# Patient Record
Sex: Female | Born: 1955
Health system: Southern US, Community
[De-identification: ages and names within clinical notes are randomized; demographics above are authoritative.]

## PROBLEM LIST (undated history)

## (undated) DIAGNOSIS — Z9889 Other specified postprocedural states: Secondary | ICD-10-CM

## (undated) DIAGNOSIS — I10 Essential (primary) hypertension: Secondary | ICD-10-CM

## (undated) DIAGNOSIS — E039 Hypothyroidism, unspecified: Secondary | ICD-10-CM

## (undated) DIAGNOSIS — C801 Malignant (primary) neoplasm, unspecified: Secondary | ICD-10-CM

## (undated) DIAGNOSIS — Z90710 Acquired absence of both cervix and uterus: Secondary | ICD-10-CM

## (undated) DIAGNOSIS — R7303 Prediabetes: Secondary | ICD-10-CM

## (undated) DIAGNOSIS — R112 Nausea with vomiting, unspecified: Secondary | ICD-10-CM

## (undated) DIAGNOSIS — J302 Other seasonal allergic rhinitis: Secondary | ICD-10-CM

## (undated) HISTORY — PX: COLONOSCOPY: SHX174

## (undated) HISTORY — PX: TONSILLECTOMY: SUR1361

## (undated) HISTORY — PX: TOTAL THYROIDECTOMY: SHX2547

## (undated) HISTORY — PX: WISDOM TOOTH EXTRACTION: SHX21

---

## 1999-01-16 ENCOUNTER — Other Ambulatory Visit: Admission: RE | Admit: 1999-01-16 | Discharge: 1999-01-16 | Payer: Self-pay | Admitting: Obstetrics and Gynecology

## 1999-02-22 ENCOUNTER — Ambulatory Visit (HOSPITAL_COMMUNITY): Admission: RE | Admit: 1999-02-22 | Discharge: 1999-02-22 | Payer: Self-pay | Admitting: Infectious Diseases

## 2014-08-26 DIAGNOSIS — E049 Nontoxic goiter, unspecified: Secondary | ICD-10-CM | POA: Insufficient documentation

## 2014-12-23 ENCOUNTER — Other Ambulatory Visit: Payer: Self-pay | Admitting: Internal Medicine

## 2014-12-23 DIAGNOSIS — C73 Malignant neoplasm of thyroid gland: Secondary | ICD-10-CM

## 2015-02-21 ENCOUNTER — Ambulatory Visit
Admission: RE | Admit: 2015-02-21 | Discharge: 2015-02-21 | Disposition: A | Discharge status: Home / Self Care | Payer: Managed Care, Other (non HMO) | Source: Ambulatory Visit | Attending: Internal Medicine | Admitting: Internal Medicine

## 2015-02-21 DIAGNOSIS — C73 Malignant neoplasm of thyroid gland: Secondary | ICD-10-CM

## 2015-05-17 ENCOUNTER — Ambulatory Visit: Payer: Managed Care, Other (non HMO) | Admitting: Podiatry

## 2015-05-18 ENCOUNTER — Encounter: Payer: Self-pay | Admitting: Podiatry

## 2015-05-18 ENCOUNTER — Ambulatory Visit (INDEPENDENT_AMBULATORY_CARE_PROVIDER_SITE_OTHER): Payer: Managed Care, Other (non HMO) | Admitting: Podiatry

## 2015-05-18 VITALS — BP 138/78 | HR 68 | Resp 15 | Ht 64.5 in | Wt 220.0 lb

## 2015-05-18 DIAGNOSIS — M2042 Other hammer toe(s) (acquired), left foot: Secondary | ICD-10-CM

## 2015-05-18 DIAGNOSIS — M722 Plantar fascial fibromatosis: Secondary | ICD-10-CM

## 2015-05-18 DIAGNOSIS — M779 Enthesopathy, unspecified: Secondary | ICD-10-CM

## 2015-05-18 NOTE — Progress Notes (Signed)
   Subjective:    Patient ID: Katelyn [name], female    DOB: Mar 04, 1956, 59 y.o.   MRN: [id number]  HPI Patient presents with bilateral shin pain. Patient would like to know if that pain is related to their plantar fasciitis they have. Patient has had for past several years.  Patient also has a bilateral nail problem on their pinky toes. Nails are thicker. Patient would like checked to make sure they do not have any toenail fungus. This has been going on for the past 2 years.  Review of Systems  Musculoskeletal: Positive for myalgias.  Skin: Positive for color change.  All other systems reviewed and are negative.      Objective:   Physical Exam        Assessment & Plan:

## 2015-05-18 NOTE — Patient Instructions (Signed)
Take 2 alleve 2x a day for leg pain

## 2015-05-19 NOTE — Progress Notes (Signed)
Subjective:     Patient ID: Katelyn [name], female   DOB: 04-28-1956, 59 y.o.   MRN: [id number]  HPI patient presents with discomfort in the lateral side of the lower leg right over left long-term history of plantar fasciitis and orthotics that she wore years ago with digital spread and problems with the second toe of the left foot with elevation of the toe and medial dislocation.   Review of Systems  All other systems reviewed and are negative.      Objective:   Physical Exam  Constitutional: She is oriented to person, place, and time.  Cardiovascular: Intact distal pulses.   Musculoskeletal: Normal range of motion.  Neurological: She is oriented to person, place, and time.  Skin: Skin is warm.  Nursing note and vitals reviewed.  neurovascular status found to be intact muscle strength adequate with range of motion subtalar midtarsal joint within normal limits. Patient does have moderate depression of the arch noted bilateral and has probable stretching of the flexor plate on the left second toe. Patient does have discomfort that occurs in the plantar fascia and into the lateral leg which may be compensatory in nature and due to foot structure     Assessment:     Tendinitis of a possible nature secondary to foot structure    Plan:     H&P x-rays reviewed and education rendered. I do think orthotics may help in her condition and I also advised aggressive local physical therapy of heat and ice along with stretching exercises and oral Aleve therapy to twice a day to try to reduce inflammation. Patient is scanned for orthotics at this time

## 2015-06-10 ENCOUNTER — Ambulatory Visit: Payer: Managed Care, Other (non HMO) | Admitting: *Deleted

## 2015-06-10 ENCOUNTER — Encounter: Payer: Self-pay | Admitting: Podiatry

## 2015-06-10 DIAGNOSIS — M779 Enthesopathy, unspecified: Secondary | ICD-10-CM

## 2015-06-10 NOTE — Patient Instructions (Signed)

## 2015-06-10 NOTE — Progress Notes (Signed)
Patient ID: Katelyn [name], female   DOB: 05/05/1956, 59 y.o.   MRN: [id number] Patient presents for orthotic pick up.  Verbal and written break in and wear instructions given.  Patient will follow up in 4 weeks if symptoms worsen or fail to improve.

## 2015-07-06 ENCOUNTER — Ambulatory Visit: Payer: Managed Care, Other (non HMO) | Admitting: *Deleted

## 2015-07-06 DIAGNOSIS — M779 Enthesopathy, unspecified: Secondary | ICD-10-CM

## 2015-07-06 NOTE — Progress Notes (Signed)
Patient ID: Katelyn [name], female   DOB: 1956/04/15, 59 y.o.   MRN: [id number] Patient presents for fitting of adjusted orthotics.  Verbal and written break in and wear instructions given.  Patient will follow up in 4 weeks if symptoms worsen or fail to improve.

## 2015-07-06 NOTE — Patient Instructions (Signed)

## 2016-02-03 MED FILL — SYNTHROID 125 MCG TABLET: 125 | 90 days supply | Qty: 90 | Fill #0

## 2016-03-14 DIAGNOSIS — D2361 Other benign neoplasm of skin of right upper limb, including shoulder: Secondary | ICD-10-CM | POA: Diagnosis not present

## 2016-03-14 DIAGNOSIS — D2372 Other benign neoplasm of skin of left lower limb, including hip: Secondary | ICD-10-CM | POA: Diagnosis not present

## 2016-03-14 DIAGNOSIS — L570 Actinic keratosis: Secondary | ICD-10-CM | POA: Diagnosis not present

## 2016-03-14 DIAGNOSIS — D225 Melanocytic nevi of trunk: Secondary | ICD-10-CM | POA: Diagnosis not present

## 2016-03-14 DIAGNOSIS — D2262 Melanocytic nevi of left upper limb, including shoulder: Secondary | ICD-10-CM | POA: Diagnosis not present

## 2016-05-18 MED FILL — SYNTHROID 125 MCG TABLET: 125 | 90 days supply | Qty: 90 | Fill #1

## 2016-05-28 DIAGNOSIS — Z124 Encounter for screening for malignant neoplasm of cervix: Secondary | ICD-10-CM | POA: Diagnosis not present

## 2016-05-28 DIAGNOSIS — R7301 Impaired fasting glucose: Secondary | ICD-10-CM | POA: Diagnosis not present

## 2016-05-28 DIAGNOSIS — Z13220 Encounter for screening for lipoid disorders: Secondary | ICD-10-CM | POA: Diagnosis not present

## 2016-05-28 DIAGNOSIS — Z1239 Encounter for other screening for malignant neoplasm of breast: Secondary | ICD-10-CM | POA: Diagnosis not present

## 2016-05-28 DIAGNOSIS — Z136 Encounter for screening for cardiovascular disorders: Secondary | ICD-10-CM | POA: Diagnosis not present

## 2016-05-28 DIAGNOSIS — Z23 Encounter for immunization: Secondary | ICD-10-CM | POA: Diagnosis not present

## 2016-05-28 DIAGNOSIS — Z131 Encounter for screening for diabetes mellitus: Secondary | ICD-10-CM | POA: Diagnosis not present

## 2016-05-28 DIAGNOSIS — Z0000 Encounter for general adult medical examination without abnormal findings: Secondary | ICD-10-CM | POA: Diagnosis not present

## 2016-06-12 DIAGNOSIS — Z1231 Encounter for screening mammogram for malignant neoplasm of breast: Secondary | ICD-10-CM | POA: Diagnosis not present

## 2016-06-27 DIAGNOSIS — E559 Vitamin D deficiency, unspecified: Secondary | ICD-10-CM | POA: Diagnosis not present

## 2016-06-27 DIAGNOSIS — R7303 Prediabetes: Secondary | ICD-10-CM | POA: Diagnosis not present

## 2016-06-27 DIAGNOSIS — Z8639 Personal history of other endocrine, nutritional and metabolic disease: Secondary | ICD-10-CM | POA: Diagnosis not present

## 2016-06-27 DIAGNOSIS — E669 Obesity, unspecified: Secondary | ICD-10-CM | POA: Diagnosis not present

## 2016-06-27 DIAGNOSIS — C73 Malignant neoplasm of thyroid gland: Secondary | ICD-10-CM | POA: Diagnosis not present

## 2016-06-27 DIAGNOSIS — Z6841 Body Mass Index (BMI) 40.0 and over, adult: Secondary | ICD-10-CM | POA: Diagnosis not present

## 2016-06-27 DIAGNOSIS — E890 Postprocedural hypothyroidism: Secondary | ICD-10-CM | POA: Diagnosis not present

## 2016-06-27 DIAGNOSIS — Z5181 Encounter for therapeutic drug level monitoring: Secondary | ICD-10-CM | POA: Diagnosis not present

## 2016-06-27 DIAGNOSIS — Z79899 Other long term (current) drug therapy: Secondary | ICD-10-CM | POA: Diagnosis not present

## 2016-06-27 MED FILL — METFORMIN HCL ER 500 MG TAB: 500 | 90 days supply | Qty: 360 | Fill #0

## 2016-07-03 MED FILL — SYNTHROID 137 MCG TABLET: 137 | 30 days supply | Qty: 30 | Fill #0

## 2016-08-21 MED FILL — SYNTHROID 137 MCG TABLET: 137 | 30 days supply | Qty: 30 | Fill #1

## 2016-08-24 DIAGNOSIS — R7301 Impaired fasting glucose: Secondary | ICD-10-CM | POA: Diagnosis not present

## 2016-08-24 DIAGNOSIS — E890 Postprocedural hypothyroidism: Secondary | ICD-10-CM | POA: Diagnosis not present

## 2016-09-03 ENCOUNTER — Other Ambulatory Visit: Payer: Self-pay | Admitting: Internal Medicine

## 2016-09-03 DIAGNOSIS — C73 Malignant neoplasm of thyroid gland: Secondary | ICD-10-CM

## 2016-09-10 MED FILL — LISINOPRIL 10 MG TABLET: 10 | 90 days supply | Qty: 90 | Fill #0

## 2016-09-11 ENCOUNTER — Ambulatory Visit
Admission: RE | Admit: 2016-09-11 | Discharge: 2016-09-11 | Disposition: A | Discharge status: Home / Self Care | Payer: 59 | Source: Ambulatory Visit | Attending: Internal Medicine | Admitting: Internal Medicine

## 2016-09-11 DIAGNOSIS — C73 Malignant neoplasm of thyroid gland: Secondary | ICD-10-CM | POA: Diagnosis not present

## 2016-09-18 MED FILL — METFORMIN HCL ER 500 MG TAB: 500 | 90 days supply | Qty: 360 | Fill #1

## 2016-09-21 MED FILL — SYNTHROID 137 MCG TABLET: 137 | 30 days supply | Qty: 30 | Fill #2

## 2016-10-22 MED FILL — SYNTHROID 137 MCG TABLET: 137 | 30 days supply | Qty: 30 | Fill #3

## 2016-11-21 MED FILL — SYNTHROID 137 MCG TABLET: 137 | 30 days supply | Qty: 30 | Fill #4

## 2016-12-05 MED FILL — LISINOPRIL 10 MG TABLET: 10 | 90 days supply | Qty: 90 | Fill #0

## 2016-12-14 MED FILL — METFORMIN HCL ER 500 MG TAB: 500 | 90 days supply | Qty: 360 | Fill #2

## 2016-12-28 MED FILL — SYNTHROID 137 MCG TABLET: 137 | 30 days supply | Qty: 30 | Fill #5

## 2017-01-03 DIAGNOSIS — C73 Malignant neoplasm of thyroid gland: Secondary | ICD-10-CM | POA: Diagnosis not present

## 2017-01-03 DIAGNOSIS — E669 Obesity, unspecified: Secondary | ICD-10-CM | POA: Diagnosis not present

## 2017-01-03 DIAGNOSIS — Z6841 Body Mass Index (BMI) 40.0 and over, adult: Secondary | ICD-10-CM | POA: Diagnosis not present

## 2017-01-03 DIAGNOSIS — R7303 Prediabetes: Secondary | ICD-10-CM | POA: Diagnosis not present

## 2017-01-03 DIAGNOSIS — E890 Postprocedural hypothyroidism: Secondary | ICD-10-CM | POA: Diagnosis not present

## 2017-01-03 MED FILL — SYNTHROID 125 MCG TABLET: 125 | 15 days supply | Qty: 15 | Fill #0

## 2017-01-30 MED FILL — TRANSDERM-SCOP 1.5 MG/72HR: 1 | 12 days supply | Qty: 4 | Fill #0

## 2017-02-21 DIAGNOSIS — E890 Postprocedural hypothyroidism: Secondary | ICD-10-CM | POA: Diagnosis not present

## 2017-02-26 MED FILL — SYNTHROID 125 MCG TABLET: 125 | 30 days supply | Qty: 15 | Fill #0

## 2017-02-26 MED FILL — SYNTHROID 137 MCG TABLET: 137 | 30 days supply | Qty: 15 | Fill #0

## 2017-02-27 MED FILL — LISINOPRIL 10 MG TABLET: 10 | 90 days supply | Qty: 90 | Fill #1

## 2017-03-18 MED FILL — METFORMIN HCL ER 500 MG TAB: 500 | 90 days supply | Qty: 360 | Fill #3

## 2017-04-05 MED FILL — LEVOTHYROXINE 137 MCG TAB: 137 | 90 days supply | Qty: 45 | Fill #0

## 2017-05-30 DIAGNOSIS — E890 Postprocedural hypothyroidism: Secondary | ICD-10-CM | POA: Diagnosis not present

## 2017-05-30 DIAGNOSIS — R7303 Prediabetes: Secondary | ICD-10-CM | POA: Diagnosis not present

## 2017-05-30 DIAGNOSIS — Z131 Encounter for screening for diabetes mellitus: Secondary | ICD-10-CM | POA: Diagnosis not present

## 2017-05-30 DIAGNOSIS — Z13220 Encounter for screening for lipoid disorders: Secondary | ICD-10-CM | POA: Diagnosis not present

## 2017-05-30 DIAGNOSIS — E559 Vitamin D deficiency, unspecified: Secondary | ICD-10-CM | POA: Diagnosis not present

## 2017-05-30 DIAGNOSIS — Z0000 Encounter for general adult medical examination without abnormal findings: Secondary | ICD-10-CM | POA: Diagnosis not present

## 2017-05-31 MED FILL — LISINOPRIL 10 MG TABLET: 10 | 90 days supply | Qty: 90 | Fill #0

## 2017-06-20 DIAGNOSIS — Z1231 Encounter for screening mammogram for malignant neoplasm of breast: Secondary | ICD-10-CM | POA: Diagnosis not present

## 2017-06-20 MED FILL — LEVOTHYROXINE 137 MCG TAB: 137 | 90 days supply | Qty: 45 | Fill #1

## 2017-06-20 MED FILL — METFORMIN HCL ER 500 MG TAB: 500 | 90 days supply | Qty: 360 | Fill #4

## 2017-07-04 DIAGNOSIS — E890 Postprocedural hypothyroidism: Secondary | ICD-10-CM | POA: Diagnosis not present

## 2017-07-04 DIAGNOSIS — R7303 Prediabetes: Secondary | ICD-10-CM | POA: Diagnosis not present

## 2017-07-04 DIAGNOSIS — C73 Malignant neoplasm of thyroid gland: Secondary | ICD-10-CM | POA: Diagnosis not present

## 2017-07-08 DIAGNOSIS — E890 Postprocedural hypothyroidism: Secondary | ICD-10-CM | POA: Diagnosis not present

## 2017-07-08 DIAGNOSIS — R7303 Prediabetes: Secondary | ICD-10-CM | POA: Diagnosis not present

## 2017-07-08 DIAGNOSIS — Z6839 Body mass index (BMI) 39.0-39.9, adult: Secondary | ICD-10-CM | POA: Diagnosis not present

## 2017-07-08 DIAGNOSIS — Z85850 Personal history of malignant neoplasm of thyroid: Secondary | ICD-10-CM | POA: Diagnosis not present

## 2017-07-08 DIAGNOSIS — E669 Obesity, unspecified: Secondary | ICD-10-CM | POA: Diagnosis not present

## 2017-08-06 MED FILL — LEVOTHYROXINE 125 MCG TABLE: 125 | 30 days supply | Qty: 15 | Fill #1

## 2017-09-03 MED FILL — LISINOPRIL 10 MG TABS: 10 | 90 days supply | Qty: 90 | Fill #1

## 2017-09-04 MED FILL — LEVOTHYROXINE 125 MCG TAB: 125 | 90 days supply | Qty: 45 | Fill #0

## 2017-09-04 MED FILL — LEVOTHYROXINE 137 MCG TAB: 137 | 90 days supply | Qty: 45 | Fill #0

## 2017-09-20 DIAGNOSIS — J069 Acute upper respiratory infection, unspecified: Secondary | ICD-10-CM | POA: Diagnosis not present

## 2017-09-25 MED FILL — METFORMIN HCL ER 500 MG TAB: 500 | 90 days supply | Qty: 360 | Fill #0

## 2017-09-25 MED FILL — AZITHROMYCIN 250 MG TABLET: 250 | 5 days supply | Qty: 6 | Fill #0

## 2017-09-30 DIAGNOSIS — H52223 Regular astigmatism, bilateral: Secondary | ICD-10-CM | POA: Diagnosis not present

## 2017-10-01 MED FILL — AZELASTINE HCL 137 MCG SPRY: 0.1 | 50 days supply | Qty: 30 | Fill #0

## 2017-10-01 MED FILL — AZITHROMYCIN 250 MG TABLET: 250 | 5 days supply | Qty: 6 | Fill #0

## 2017-10-30 ENCOUNTER — Ambulatory Visit: Payer: Self-pay | Admitting: Nurse Practitioner

## 2017-10-30 VITALS — BP 132/90 | HR 92 | Temp 98.6°F | Resp 20 | Wt 240.0 lb

## 2017-10-30 DIAGNOSIS — H02401 Unspecified ptosis of right eyelid: Secondary | ICD-10-CM

## 2017-10-30 MED ORDER — PREDNISONE 10 MG (21) PO TBPK: ORAL_TABLET | ORAL | 0 refills | Status: AC

## 2017-10-30 MED FILL — predniSONE 10 MG TABS: 10 | 6 days supply | Qty: 21 | Fill #0

## 2017-10-30 NOTE — Progress Notes (Signed)
Subjective:    Katelyn [name] is a 62 y.o. female who presents for evaluation of drooping in the upper lid in the right eye. She has noticed the above symptoms for 1-2 days. Onset was gradual. Patient denies blurred vision, discharge, erythema, foreign body sensation, itching, pain, photophobia, tearing and visual field deficit. There is a history of contact lens use.  Patient states that she recently was treated for a sinus infection with 2 courses of abx and steroids.  Patient feels she is still getting over that.  Patient states she was told repeatedly by her co-workers that she looked tired and then noticed her eyelid drooping. No interventions by patient thus far.   The following portions of the patient's history were reviewed and updated as appropriate: allergies, current medications and past medical history.  Review of Systems Constitutional: negative Eyes: positive for cataracts and eyelid drooping, right, negative for color blindness, icterus, irritation, redness and visual disturbance Ears, nose, mouth, throat, and face: positive for sinus tenderness Respiratory: negative Cardiovascular: negative Neurological: negative   Objective:    BP 132/90 (BP Location: Right Arm, Patient Position: Sitting, Cuff Size: Normal)   Pulse 92   Temp 98.6 F (37 C) (Oral)   Resp 20   Wt 240 lb (108.9 kg)   SpO2 98%   BMI 40.56 kg/m       General: alert, cooperative and fatigued  Eyes:  negative findings: lids and lashes normal, conjunctivae and sclerae normal, corneas clear and pupils equal, round, reactive to light and accomodation, positive findings: eyelids/periorbital: ptosis on the right  Vision: Not performed  Fluorescein:  not done     Assessment:   Ptosis, Right Upper Eyelid  Plan:    Warm compress to eye(s). FU here in 2 days or PRN.  Discussed trial of Sterapred with patient and patient would like to proceed.  Patient instructed to cover opposite eye when at home to attempt to  strengthen eye muscles.  Informed patient if symptoms do not improve will have her f/u with Ophthalmology or PCP. Patient verbalized understanding.

## 2017-10-30 NOTE — Patient Instructions (Addendum)
Ptosis Repair Ptosis repair is a procedure to tighten a drooping upper eyelid (ptosis). Ptosis usually happens when the eyelid muscles become weak and loose from aging. It may also be caused by an injury or nerve damage. Children can be born with ptosis (congenital ptosis). You may need this surgery if ptosis is causing vision problems. You may need ptosis repair in one or both eyes. Tell a health care provider about:  Any allergies you have.  All medicines you are taking, including vitamins, herbs, eye drops, creams, and over-the-counter medicines.  Any problems you or family members have had with anesthetic medicines.  Any blood disorders you have.  Any surgeries you have had.  Any medical conditions you have.  Whether you are pregnant or may be pregnant. What are the risks? Generally, this is a safe procedure. However, problems may occur, including:  Infection.  Bleeding.  Allergic reactions to medicines.  Damage to muscles, nerves, or blood vessels near the eye.  Dry eye.  Under-correction or over-correction of your vision that requires a second surgery.  Difference in the ways your eyes look (asymmetry).  What happens before the procedure?  You will have a detailed eye exam to help your surgeon plan the procedure. Your surgeon will take measurements of your eyelid opening and test your eyelid muscles.  Follow instructions from your health care provider about eating or drinking restrictions.  Ask your health care provider about: ? Changing or stopping your regular medicines. This is especially important if you are taking diabetes medicines or blood thinners. ? Taking medicines such as aspirin and ibuprofen. These medicines can thin your blood. Do not take these medicines before your procedure if your health care provider instructs you not to.  Plan to have someone take you home after the procedure.  If you go home right after the procedure, plan to have someone with  you for 24 hours. What happens during the procedure?  To reduce your risk of infection: ? Your health care team will wash or sanitize their hands. ? Your skin will be washed with soap.  An IV tube will be inserted into one of your veins.  You will be given one or more of the following: ? A medicine to help you relax (sedative). ? A medicine to numb the area (local anesthetic). ? A medicine to make you fall asleep (general anesthetic).  Your surgeon will make a cut (incision) in a natural crease of skin near your eyelid. In some cases, the incision can be made just on the underside of your eyelid.  Your surgeon will locate the muscle that lifts your eyelid through this incision. This muscle may be tightened with stitches (sutures). In some cases, a section of the muscle may be removed.  If the eyelid muscle cannot lift the eyelid, it may be attached with sutures to the muscle that raises your eyebrows.  If there is excess skin in your upper eyelid, this skin may be removed (blepharoplasty).  The incision will be closed with sutures.  An antibiotic and moistening eye drop will be placed in your eye.  If both eyes are being repaired, your surgeon may then do the procedure on your other eye. The procedure may vary among health care providers and hospitals. The procedure will also vary depending on the condition of your eye. What happens after the procedure?  Your blood pressure, heart rate, breathing rate, and blood oxygen level will be monitored often until the medicines you were given have  worn off. This information is not intended to replace advice given to you by your health care provider. Make sure you discuss any questions you have with your health care provider. Document Released: 06/26/2001 Document Revised: 10/19/2015 Document Reviewed: 08/11/2015 Elsevier Interactive Patient Education  [employer].

## 2017-11-01 ENCOUNTER — Telehealth: Payer: Self-pay | Admitting: Emergency Medicine

## 2017-11-01 NOTE — Telephone Encounter (Signed)
Patient stated she did not notice much of a difference and if she does not feel better by Monday she will give [place] a call back to see if the provider has anymore suggestions

## 2017-11-26 DIAGNOSIS — I10 Essential (primary) hypertension: Secondary | ICD-10-CM | POA: Diagnosis not present

## 2017-11-26 DIAGNOSIS — Z6841 Body Mass Index (BMI) 40.0 and over, adult: Secondary | ICD-10-CM | POA: Diagnosis not present

## 2017-11-26 DIAGNOSIS — E559 Vitamin D deficiency, unspecified: Secondary | ICD-10-CM | POA: Diagnosis not present

## 2017-11-26 DIAGNOSIS — E669 Obesity, unspecified: Secondary | ICD-10-CM | POA: Diagnosis not present

## 2017-11-26 DIAGNOSIS — R7303 Prediabetes: Secondary | ICD-10-CM | POA: Diagnosis not present

## 2017-11-26 DIAGNOSIS — E890 Postprocedural hypothyroidism: Secondary | ICD-10-CM | POA: Diagnosis not present

## 2017-11-27 MED FILL — LISINOPRIL 10 MG TABS: 10 | 90 days supply | Qty: 90 | Fill #0

## 2017-11-29 MED FILL — LEVOTHYROXINE 125 MCG TAB: 125 | 90 days supply | Qty: 90 | Fill #0

## 2017-12-26 DIAGNOSIS — N951 Menopausal and female climacteric states: Secondary | ICD-10-CM | POA: Diagnosis not present

## 2017-12-26 DIAGNOSIS — N8189 Other female genital prolapse: Secondary | ICD-10-CM | POA: Diagnosis not present

## 2017-12-27 ENCOUNTER — Other Ambulatory Visit: Payer: Self-pay | Admitting: Obstetrics and Gynecology

## 2017-12-27 DIAGNOSIS — Z780 Asymptomatic menopausal state: Secondary | ICD-10-CM

## 2018-01-01 MED FILL — LOSARTAN POTASSIUM 25 MG TA: 25 | 30 days supply | Qty: 30 | Fill #0

## 2018-01-06 ENCOUNTER — Ambulatory Visit
Admission: RE | Admit: 2018-01-06 | Discharge: 2018-01-06 | Disposition: A | Discharge status: Home / Self Care | Payer: 59 | Source: Ambulatory Visit | Attending: Obstetrics and Gynecology | Admitting: Obstetrics and Gynecology

## 2018-01-06 DIAGNOSIS — Z780 Asymptomatic menopausal state: Secondary | ICD-10-CM

## 2018-01-17 MED FILL — METFORMIN HCL ER 500 MG TAB: 500 | 90 days supply | Qty: 360 | Fill #1

## 2018-01-27 DIAGNOSIS — R7309 Other abnormal glucose: Secondary | ICD-10-CM | POA: Diagnosis not present

## 2018-01-27 DIAGNOSIS — E890 Postprocedural hypothyroidism: Secondary | ICD-10-CM | POA: Diagnosis not present

## 2018-01-27 MED FILL — LOSARTAN POTASSIUM 25 MG TA: 25 | 30 days supply | Qty: 30 | Fill #1

## 2018-01-30 DIAGNOSIS — Z789 Other specified health status: Secondary | ICD-10-CM | POA: Diagnosis not present

## 2018-01-30 DIAGNOSIS — N8189 Other female genital prolapse: Secondary | ICD-10-CM | POA: Diagnosis not present

## 2018-02-04 DIAGNOSIS — Z01818 Encounter for other preprocedural examination: Secondary | ICD-10-CM | POA: Diagnosis not present

## 2018-02-04 DIAGNOSIS — N9489 Other specified conditions associated with female genital organs and menstrual cycle: Secondary | ICD-10-CM | POA: Diagnosis not present

## 2018-02-04 DIAGNOSIS — Z85850 Personal history of malignant neoplasm of thyroid: Secondary | ICD-10-CM | POA: Diagnosis not present

## 2018-02-04 DIAGNOSIS — R7303 Prediabetes: Secondary | ICD-10-CM | POA: Diagnosis not present

## 2018-02-04 DIAGNOSIS — E890 Postprocedural hypothyroidism: Secondary | ICD-10-CM | POA: Diagnosis not present

## 2018-02-04 NOTE — Patient Instructions (Addendum)
Your procedure is scheduled on: Wednesday, May 15  Enter through the Main Entrance of [facility] at: 7 am  Pick up the phone at the desk and dial [phone].  Call this number if you have problems the morning of surgery: [phone].  Remember: Do NOT eat food or Do NOT drink clear liquids (including water) after midnight Tuesday.  Take these medicines the morning of surgery with a SIP OF WATER: synthroid  Do not take Tuesday night and Wednesday morning dose of Metformin.  We will check your blood sugar upon arrival and treat if needed.  Stop herbal medications, vitamin supplements and ibuprofen 1 week prior to surgery.  Do NOT wear jewelry (body piercing), metal hair clips/bobby pins, make-up, or nail polish. Do NOT wear lotions, powders, or perfumes.  You may wear deoderant. Do NOT shave for 48 hours prior to surgery. Do NOT bring valuables to the hospital. Contacts may not be worn into surgery.  Leave suitcase in car.  After surgery it may be brought to your room.  For patients admitted to the hospital, checkout time is 11:00 AM the day of discharge. Home with Husband Hedy Camara cell [phone]

## 2018-02-17 ENCOUNTER — Encounter (HOSPITAL_COMMUNITY)
Admission: RE | Admit: 2018-02-17 | Discharge: 2018-02-17 | Disposition: A | Discharge status: Home / Self Care | Payer: 59 | Source: Ambulatory Visit | Attending: Obstetrics and Gynecology | Admitting: Obstetrics and Gynecology

## 2018-02-17 ENCOUNTER — Other Ambulatory Visit: Payer: Self-pay

## 2018-02-17 ENCOUNTER — Encounter (HOSPITAL_COMMUNITY): Payer: Self-pay | Admitting: *Deleted

## 2018-02-17 DIAGNOSIS — Z01812 Encounter for preprocedural laboratory examination: Secondary | ICD-10-CM | POA: Insufficient documentation

## 2018-02-17 HISTORY — DX: Other specified postprocedural states: Z98.890

## 2018-02-17 HISTORY — DX: Nausea with vomiting, unspecified: R11.2

## 2018-02-17 HISTORY — DX: Malignant (primary) neoplasm, unspecified: C80.1

## 2018-02-17 HISTORY — DX: Essential (primary) hypertension: I10

## 2018-02-17 HISTORY — DX: Hypothyroidism, unspecified: E03.9

## 2018-02-17 HISTORY — DX: Prediabetes: R73.03

## 2018-02-17 HISTORY — DX: Other seasonal allergic rhinitis: J30.2

## 2018-02-17 LAB — CBC
HCT: 38.7 % (ref 36.0–46.0)
Hemoglobin: 13.1 g/dL (ref 12.0–15.0)
MCH: 29.2 pg (ref 26.0–34.0)
MCHC: 33.9 g/dL (ref 30.0–36.0)
MCV: 86.2 fL (ref 78.0–100.0)
PLATELETS: 254 10*3/uL (ref 150–400)
RBC: 4.49 MIL/uL (ref 3.87–5.11)
RDW: 13.2 % (ref 11.5–15.5)
WBC: 7.8 10*3/uL (ref 4.0–10.5)

## 2018-02-17 LAB — TYPE AND SCREEN
ABO/RH(D): A NEG
Antibody Screen: NEGATIVE

## 2018-02-17 LAB — COMPREHENSIVE METABOLIC PANEL
ALT: 40 U/L (ref 14–54)
AST: 41 U/L (ref 15–41)
Albumin: 3.9 g/dL (ref 3.5–5.0)
Alkaline Phosphatase: 70 U/L (ref 38–126)
Anion gap: 10 (ref 5–15)
BUN: 17 mg/dL (ref 6–20)
CALCIUM: 9 mg/dL (ref 8.9–10.3)
CO2: 22 mmol/L (ref 22–32)
CREATININE: 0.98 mg/dL (ref 0.44–1.00)
Chloride: 105 mmol/L (ref 101–111)
GFR calc Af Amer: >60 mL/min (ref >60)
Glucose, Bld: 111 mg/dL — ABNORMAL HIGH (ref 65–99)
Potassium: 4.4 mmol/L (ref 3.5–5.1)
SODIUM: 137 mmol/L (ref 135–145)
TOTAL PROTEIN: 7.3 g/dL (ref 6.5–8.1)
Total Bilirubin: 0.4 mg/dL (ref 0.3–1.2)

## 2018-02-17 LAB — ABO/RH: ABO/RH(D): A NEG

## 2018-02-24 MED FILL — LOSARTAN POTASSIUM 25 MG TA: 25 | 30 days supply | Qty: 30 | Fill #2

## 2018-02-24 NOTE — Anesthesia Preprocedure Evaluation (Addendum)
Anesthesia Evaluation  Patient identified by MRN, date of birth, ID band Patient awake    Reviewed: Allergy & Precautions, NPO status , Patient's Chart, lab work & pertinent test results  History of Anesthesia Complications (+) PONV and history of anesthetic complications  Airway Mallampati: II  TM Distance: >3 FB Neck ROM: Full    Dental no notable dental hx. (+) Teeth Intact, Dental Advisory Given   Pulmonary neg pulmonary ROS,    Pulmonary exam normal breath sounds clear to auscultation       Cardiovascular hypertension, Pt. on medications Normal cardiovascular exam Rhythm:Regular Rate:Normal     Neuro/Psych negative neurological ROS  negative psych ROS   GI/Hepatic Neg liver ROS,   Endo/Other  Hypothyroidism Morbid obesity  Renal/GU negative Renal ROS     Musculoskeletal negative musculoskeletal ROS (+)   Abdominal (+) + obese,   Peds negative pediatric ROS (+)  Hematology   Anesthesia Other Findings   Reproductive/Obstetrics negative OB ROS                             Anesthesia Physical Anesthesia Plan  ASA: III  Anesthesia Plan: General   Post-op Pain Management:    Induction: Intravenous  PONV Risk Score and Plan: 4 or greater and Treatment may vary due to age or medical condition, Ondansetron, Dexamethasone and Scopolamine patch - Pre-op  Airway Management Planned: Oral ETT  Additional Equipment:   Intra-op Plan:   Post-operative Plan: Extubation in OR  Informed Consent: I have reviewed the patients History and Physical, chart, labs and discussed the procedure including the risks, benefits and alternatives for the proposed anesthesia with the patient or authorized representative who has indicated his/her understanding and acceptance.   Dental advisory given  Plan Discussed with: CRNA and Anesthesiologist  Anesthesia Plan Comments:         Anesthesia  Quick Evaluation

## 2018-02-25 NOTE — H&P (Signed)
Katelyn [name] is an 62 y.o. female G2P2 with pelvic relaxation for definitive treatment.  Desires LAVH/BS A&P repeair.  D/w pt r/b/a of surgery, wishes to proceed.  Having more and more pressure and "bulge" sensation.  Limits from grandkids.    Pertinent Gynecological History: OB History: G2 P2002 SVD x 2, no ccomp, mae and female, 7#7-7$13 No abn pap, last 05/28/16, HR HPV neg No STD  Menstrual History: No LMP recorded (lmp unknown). Patient is postmenopausal.    Past Medical History:  Diagnosis Date  . Cancer [facility])    thyroid cancer  . Hypertension   . Hypothyroidism   . PONV (postoperative nausea and vomiting)   . Pre-diabetes   . Seasonal allergies   . SVD (spontaneous vaginal delivery)    x 2    Past Surgical History:  Procedure Laterality Date  . COLONOSCOPY    . TONSILLECTOMY    . TOTAL THYROIDECTOMY     in [place]  . WISDOM TOOTH EXTRACTION      FH;DM, HTN  Social History:  reports that she has never smoked. She has never used smokeless tobacco. She reports that she does not drink alcohol or use drugs. married, Programme researcher, broadcasting/film/video - DM  Allergies:  Allergies  Allergen Reactions  . Sulfa Antibiotics Rash  narcotics give N/V  Meds levothyroxine 125mg , meetformin 2000mg , losrtan 25mg     Review of Systems  Constitutional: Negative.   HENT: Negative.   Eyes: Negative.   Respiratory: Negative.   Cardiovascular: Negative.   Gastrointestinal: Negative.   Genitourinary:       Pelvic pressre  Musculoskeletal: Negative.   Skin: Negative.   Neurological: Negative.   Psychiatric/Behavioral: Negative.     There were no vitals taken for this visit. Physical Exam  Constitutional: She is oriented to person, place, and time. She appears well-developed and well-nourished.  HENT:  Head: Normocephalic and atraumatic.  Cardiovascular: Normal rate and regular rhythm.  Respiratory: Effort normal and breath sounds normal. No respiratory distress. She has no wheezes.  GI:  Soft. Bowel sounds are normal. She exhibits no distension. There is no tenderness.  Musculoskeletal: Normal range of motion.  Neurological: She is alert and oriented to person, place, and time.  Skin: Skin is warm and dry.  Psychiatric: She has a normal mood and affect. Her behavior is normal.    No results found for this or any previous visit (from the past 24 hour(s)).  No results found.  Assessment/Plan: [id number] with pelvic relaxation for definitive management Ancef for prophylaxis LAVH/BS, A&P repair PCA for pain - plan for routine Postop care.    Katelyn [name] 02/25/2018, 9:46 PM

## 2018-02-26 ENCOUNTER — Other Ambulatory Visit: Payer: Self-pay

## 2018-02-26 ENCOUNTER — Observation Stay (HOSPITAL_COMMUNITY)
Admission: AD | Admit: 2018-02-26 | Discharge: 2018-02-27 | Disposition: A | Discharge status: Home / Self Care | Payer: 59 | Source: Ambulatory Visit | Attending: Obstetrics and Gynecology | Admitting: Obstetrics and Gynecology

## 2018-02-26 ENCOUNTER — Ambulatory Visit (HOSPITAL_COMMUNITY): Payer: 59 | Admitting: Anesthesiology

## 2018-02-26 ENCOUNTER — Encounter (HOSPITAL_COMMUNITY): Payer: Self-pay

## 2018-02-26 ENCOUNTER — Encounter (HOSPITAL_COMMUNITY)
Admission: AD | Disposition: A | Discharge status: Home / Self Care | Payer: Self-pay | Source: Ambulatory Visit | Attending: Obstetrics and Gynecology

## 2018-02-26 DIAGNOSIS — N8110 Cystocele, unspecified: Principal | ICD-10-CM | POA: Insufficient documentation

## 2018-02-26 DIAGNOSIS — N8189 Other female genital prolapse: Secondary | ICD-10-CM | POA: Diagnosis not present

## 2018-02-26 DIAGNOSIS — Z6841 Body Mass Index (BMI) 40.0 and over, adult: Secondary | ICD-10-CM | POA: Diagnosis not present

## 2018-02-26 DIAGNOSIS — N816 Rectocele: Secondary | ICD-10-CM | POA: Insufficient documentation

## 2018-02-26 DIAGNOSIS — I10 Essential (primary) hypertension: Secondary | ICD-10-CM | POA: Diagnosis not present

## 2018-02-26 DIAGNOSIS — E039 Hypothyroidism, unspecified: Secondary | ICD-10-CM | POA: Diagnosis not present

## 2018-02-26 DIAGNOSIS — N812 Incomplete uterovaginal prolapse: Secondary | ICD-10-CM | POA: Diagnosis present

## 2018-02-26 DIAGNOSIS — Z85850 Personal history of malignant neoplasm of thyroid: Secondary | ICD-10-CM | POA: Insufficient documentation

## 2018-02-26 DIAGNOSIS — N800 Endometriosis of uterus: Secondary | ICD-10-CM | POA: Diagnosis not present

## 2018-02-26 DIAGNOSIS — Z90710 Acquired absence of both cervix and uterus: Secondary | ICD-10-CM

## 2018-02-26 HISTORY — PX: ANTERIOR AND POSTERIOR REPAIR: SHX5121

## 2018-02-26 HISTORY — PX: LAPAROSCOPIC VAGINAL HYSTERECTOMY WITH SALPINGECTOMY: SHX6680

## 2018-02-26 HISTORY — DX: Acquired absence of both cervix and uterus: Z90.710

## 2018-02-26 SURGERY — HYSTERECTOMY, VAGINAL, LAPAROSCOPY-ASSISTED, WITH SALPINGECTOMY
Anesthesia: General

## 2018-02-26 MED ORDER — EPHEDRINE 5 MG/ML INJ
INTRAVENOUS | Status: AC
  Filled 2018-02-26: qty 10

## 2018-02-26 MED ORDER — LACTATED RINGERS IV SOLN: INTRAVENOUS | Status: DC

## 2018-02-26 MED ORDER — VASOPRESSIN 20 UNIT/ML IV SOLN
INTRAVENOUS | Status: AC
  Filled 2018-02-26: qty 1

## 2018-02-26 MED ORDER — ONDANSETRON HCL 4 MG PO TABS: 4.0000 mg | ORAL_TABLET | Freq: Four times a day (QID) | ORAL | Status: DC | PRN

## 2018-02-26 MED ORDER — KETOROLAC TROMETHAMINE 30 MG/ML IJ SOLN
INTRAMUSCULAR | Status: DC | PRN
  Administered 2018-02-26: 30 mg via INTRAVENOUS

## 2018-02-26 MED ORDER — BUPIVACAINE HCL (PF) 0.25 % IJ SOLN
INTRAMUSCULAR | Status: DC | PRN
  Administered 2018-02-26: 12 mL

## 2018-02-26 MED ORDER — METFORMIN HCL ER 500 MG PO TB24
1000.0000 mg | ORAL_TABLET | Freq: Two times a day (BID) | ORAL | Status: DC
  Administered 2018-02-26 – 2018-02-27 (×2): 1000 mg via ORAL
  Filled 2018-02-26 (×2): qty 2

## 2018-02-26 MED ORDER — PROPOFOL 500 MG/50ML IV EMUL
INTRAVENOUS | Status: AC
  Filled 2018-02-26: qty 50

## 2018-02-26 MED ORDER — PROMETHAZINE HCL 25 MG/ML IJ SOLN
INTRAMUSCULAR | Status: AC
  Filled 2018-02-26: qty 1

## 2018-02-26 MED ORDER — OXYCODONE-ACETAMINOPHEN 5-325 MG PO TABS: 1.0000 | ORAL_TABLET | ORAL | Status: DC | PRN

## 2018-02-26 MED ORDER — ONDANSETRON HCL 4 MG/2ML IJ SOLN
INTRAMUSCULAR | Status: AC
  Filled 2018-02-26: qty 2

## 2018-02-26 MED ORDER — HYDROMORPHONE HCL 1 MG/ML IJ SOLN
0.2500 mg | INTRAMUSCULAR | Status: DC | PRN
  Administered 2018-02-26: 0.5 mg via INTRAVENOUS

## 2018-02-26 MED ORDER — LACTATED RINGERS IV SOLN
INTRAVENOUS | Status: DC
  Administered 2018-02-26 (×2): via INTRAVENOUS

## 2018-02-26 MED ORDER — EPHEDRINE SULFATE 50 MG/ML IJ SOLN
INTRAMUSCULAR | Status: DC | PRN
  Administered 2018-02-26 (×3): 10 mg via INTRAVENOUS

## 2018-02-26 MED ORDER — SCOPOLAMINE 1 MG/3DAYS TD PT72
1.0000 | MEDICATED_PATCH | TRANSDERMAL | Status: DC
  Administered 2018-02-26: 1.5 mg via TRANSDERMAL

## 2018-02-26 MED ORDER — FENTANYL CITRATE (PF) 100 MCG/2ML IJ SOLN
INTRAMUSCULAR | Status: AC
  Filled 2018-02-26: qty 2

## 2018-02-26 MED ORDER — PROPOFOL 10 MG/ML IV BOLUS
INTRAVENOUS | Status: AC
  Filled 2018-02-26: qty 20

## 2018-02-26 MED ORDER — SCOPOLAMINE 1 MG/3DAYS TD PT72
MEDICATED_PATCH | TRANSDERMAL | Status: AC
  Administered 2018-02-26: 1.5 mg via TRANSDERMAL
  Filled 2018-02-26: qty 1

## 2018-02-26 MED ORDER — LEVOTHYROXINE SODIUM 125 MCG PO TABS
125.0000 ug | ORAL_TABLET | Freq: Every day | ORAL | Status: DC
  Administered 2018-02-27: 125 ug via ORAL
  Filled 2018-02-26: qty 1

## 2018-02-26 MED ORDER — PROPOFOL 10 MG/ML IV BOLUS
INTRAVENOUS | Status: DC | PRN
  Administered 2018-02-26: 200 mg via INTRAVENOUS

## 2018-02-26 MED ORDER — HYDROCODONE-ACETAMINOPHEN 7.5-325 MG PO TABS: 1.0000 | ORAL_TABLET | Freq: Once | ORAL | Status: DC | PRN

## 2018-02-26 MED ORDER — NALOXONE HCL 0.4 MG/ML IJ SOLN: 0.4000 mg | INTRAMUSCULAR | Status: DC | PRN

## 2018-02-26 MED ORDER — CEFAZOLIN SODIUM-DEXTROSE 2-4 GM/100ML-% IV SOLN
INTRAVENOUS | Status: AC
  Filled 2018-02-26: qty 100

## 2018-02-26 MED ORDER — LIDOCAINE HCL (CARDIAC) PF 100 MG/5ML IV SOSY
PREFILLED_SYRINGE | INTRAVENOUS | Status: DC | PRN
  Administered 2018-02-26: 100 mg via INTRAVENOUS

## 2018-02-26 MED ORDER — GUAIFENESIN 100 MG/5ML PO SOLN: 15.0000 mL | ORAL | Status: DC | PRN

## 2018-02-26 MED ORDER — SIMETHICONE 80 MG PO CHEW: 80.0000 mg | CHEWABLE_TABLET | Freq: Four times a day (QID) | ORAL | Status: DC | PRN

## 2018-02-26 MED ORDER — ACETAMINOPHEN 10 MG/ML IV SOLN
INTRAVENOUS | Status: AC
  Filled 2018-02-26: qty 100

## 2018-02-26 MED ORDER — CEFAZOLIN SODIUM-DEXTROSE 2-4 GM/100ML-% IV SOLN
2.0000 g | INTRAVENOUS | Status: AC
  Administered 2018-02-26: 2 g via INTRAVENOUS

## 2018-02-26 MED ORDER — ONDANSETRON HCL 4 MG/2ML IJ SOLN: 4.0000 mg | Freq: Four times a day (QID) | INTRAMUSCULAR | Status: DC | PRN

## 2018-02-26 MED ORDER — ESTRADIOL 0.1 MG/GM VA CREA
TOPICAL_CREAM | VAGINAL | Status: DC | PRN
  Administered 2018-02-26: 1 via VAGINAL

## 2018-02-26 MED ORDER — FENTANYL CITRATE (PF) 100 MCG/2ML IJ SOLN
INTRAMUSCULAR | Status: DC | PRN
  Administered 2018-02-26 (×6): 50 ug via INTRAVENOUS

## 2018-02-26 MED ORDER — LACTATED RINGERS IV SOLN
INTRAVENOUS | Status: DC
  Administered 2018-02-26: 10:00:00 via INTRAVENOUS
  Administered 2018-02-26: 125 mL/h via INTRAVENOUS

## 2018-02-26 MED ORDER — SODIUM CHLORIDE 0.9 % IJ SOLN
INTRAMUSCULAR | Status: AC
  Filled 2018-02-26: qty 10

## 2018-02-26 MED ORDER — VASOPRESSIN 20 UNIT/ML IV SOLN
INTRAVENOUS | Status: DC | PRN
  Administered 2018-02-26: 7 mL via INTRAMUSCULAR
  Administered 2018-02-26: 20 mL via INTRAMUSCULAR

## 2018-02-26 MED ORDER — DIPHENHYDRAMINE HCL 50 MG/ML IJ SOLN: 12.5000 mg | Freq: Four times a day (QID) | INTRAMUSCULAR | Status: DC | PRN

## 2018-02-26 MED ORDER — DIPHENHYDRAMINE HCL 12.5 MG/5ML PO ELIX: 12.5000 mg | ORAL_SOLUTION | Freq: Four times a day (QID) | ORAL | Status: DC | PRN

## 2018-02-26 MED ORDER — SODIUM CHLORIDE 0.9% FLUSH: 9.0000 mL | INTRAVENOUS | Status: DC | PRN

## 2018-02-26 MED ORDER — MENTHOL 3 MG MT LOZG: 1.0000 | LOZENGE | OROMUCOSAL | Status: DC | PRN

## 2018-02-26 MED ORDER — PROPOFOL 500 MG/50ML IV EMUL
INTRAVENOUS | Status: DC | PRN
  Administered 2018-02-26: 25 ug/kg/min via INTRAVENOUS

## 2018-02-26 MED ORDER — FENTANYL CITRATE (PF) 250 MCG/5ML IJ SOLN
INTRAMUSCULAR | Status: AC
  Filled 2018-02-26: qty 5

## 2018-02-26 MED ORDER — HYDROMORPHONE HCL 1 MG/ML IJ SOLN
INTRAMUSCULAR | Status: AC
  Filled 2018-02-26: qty 1

## 2018-02-26 MED ORDER — MIDAZOLAM HCL 2 MG/2ML IJ SOLN
INTRAMUSCULAR | Status: DC | PRN
  Administered 2018-02-26: 2 mg via INTRAVENOUS

## 2018-02-26 MED ORDER — DEXAMETHASONE SODIUM PHOSPHATE 4 MG/ML IJ SOLN
INTRAMUSCULAR | Status: AC
  Filled 2018-02-26: qty 1

## 2018-02-26 MED ORDER — ALUM & MAG HYDROXIDE-SIMETH 200-200-20 MG/5ML PO SUSP: 30.0000 mL | ORAL | Status: DC | PRN

## 2018-02-26 MED ORDER — ESTRADIOL 0.1 MG/GM VA CREA
TOPICAL_CREAM | VAGINAL | Status: AC
  Filled 2018-02-26: qty 42.5

## 2018-02-26 MED ORDER — ONDANSETRON HCL 4 MG/2ML IJ SOLN
INTRAMUSCULAR | Status: DC | PRN
  Administered 2018-02-26: 4 mg via INTRAVENOUS

## 2018-02-26 MED ORDER — 0.9 % SODIUM CHLORIDE (POUR BTL) OPTIME
TOPICAL | Status: DC | PRN
  Administered 2018-02-26: 1000 mL

## 2018-02-26 MED ORDER — SUGAMMADEX SODIUM 200 MG/2ML IV SOLN
INTRAVENOUS | Status: DC | PRN
  Administered 2018-02-26: 200 mg via INTRAVENOUS

## 2018-02-26 MED ORDER — ACETAMINOPHEN 10 MG/ML IV SOLN: 1000.0000 mg | Freq: Once | INTRAVENOUS | Status: DC | PRN

## 2018-02-26 MED ORDER — ROCURONIUM BROMIDE 100 MG/10ML IV SOLN
INTRAVENOUS | Status: DC | PRN
  Administered 2018-02-26: 10 mg via INTRAVENOUS
  Administered 2018-02-26: 50 mg via INTRAVENOUS
  Administered 2018-02-26: 10 mg via INTRAVENOUS

## 2018-02-26 MED ORDER — BUPIVACAINE HCL (PF) 0.25 % IJ SOLN
INTRAMUSCULAR | Status: AC
  Filled 2018-02-26: qty 30

## 2018-02-26 MED ORDER — SUGAMMADEX SODIUM 200 MG/2ML IV SOLN
INTRAVENOUS | Status: AC
  Filled 2018-02-26: qty 2

## 2018-02-26 MED ORDER — LIDOCAINE HCL (PF) 1 % IJ SOLN
INTRAMUSCULAR | Status: AC
  Filled 2018-02-26: qty 5

## 2018-02-26 MED ORDER — MIDAZOLAM HCL 2 MG/2ML IJ SOLN
INTRAMUSCULAR | Status: AC
  Filled 2018-02-26: qty 2

## 2018-02-26 MED ORDER — MEPERIDINE HCL 25 MG/ML IJ SOLN: 6.2500 mg | INTRAMUSCULAR | Status: DC | PRN

## 2018-02-26 MED ORDER — SODIUM CHLORIDE 0.9 % IR SOLN
Status: DC | PRN
  Administered 2018-02-26: 3000 mL

## 2018-02-26 MED ORDER — IBUPROFEN 800 MG PO TABS
800.0000 mg | ORAL_TABLET | Freq: Three times a day (TID) | ORAL | Status: DC | PRN
  Administered 2018-02-26 – 2018-02-27 (×2): 800 mg via ORAL
  Filled 2018-02-26 (×2): qty 1

## 2018-02-26 MED ORDER — LOSARTAN POTASSIUM 25 MG PO TABS
25.0000 mg | ORAL_TABLET | Freq: Every day | ORAL | Status: DC
  Administered 2018-02-26: 25 mg via ORAL
  Filled 2018-02-26 (×2): qty 1

## 2018-02-26 MED ORDER — SODIUM CHLORIDE 0.9 % IJ SOLN
INTRAMUSCULAR | Status: AC
  Filled 2018-02-26: qty 100

## 2018-02-26 MED ORDER — PROMETHAZINE HCL 25 MG/ML IJ SOLN
6.2500 mg | INTRAMUSCULAR | Status: DC | PRN
  Administered 2018-02-26: 6.25 mg via INTRAVENOUS

## 2018-02-26 MED ORDER — ACETAMINOPHEN 10 MG/ML IV SOLN
1000.0000 mg | Freq: Once | INTRAVENOUS | Status: AC
  Administered 2018-02-26: 1000 mg via INTRAVENOUS
  Filled 2018-02-26: qty 100

## 2018-02-26 MED ORDER — DEXAMETHASONE SODIUM PHOSPHATE 10 MG/ML IJ SOLN
INTRAMUSCULAR | Status: DC | PRN
  Administered 2018-02-26: 4 mg via INTRAVENOUS

## 2018-02-26 MED ORDER — SODIUM CHLORIDE 0.9 % IJ SOLN
INTRAMUSCULAR | Status: DC | PRN
  Administered 2018-02-26: 10 mL

## 2018-02-26 MED ORDER — HYDROMORPHONE 1 MG/ML IV SOLN
INTRAVENOUS | Status: DC
  Administered 2018-02-26: 14:00:00 via INTRAVENOUS
  Filled 2018-02-26: qty 25

## 2018-02-26 MED ORDER — KETOROLAC TROMETHAMINE 30 MG/ML IJ SOLN
INTRAMUSCULAR | Status: AC
  Filled 2018-02-26: qty 1

## 2018-02-26 SURGICAL SUPPLY — 58 items
APPLICATOR ARISTA FLEXITIP XL (MISCELLANEOUS) ×3 IMPLANT
BLADE SURG 15 STRL LF C SS BP (BLADE) ×2 IMPLANT
BLADE SURG 15 STRL SS (BLADE) ×1
CABLE HIGH FREQUENCY MONO STRZ (ELECTRODE) IMPLANT
CONT PATH 16OZ SNAP LID 3702 (MISCELLANEOUS) ×3 IMPLANT
COVER BACK TABLE 60X90IN (DRAPES) ×3 IMPLANT
COVER MAYO STAND STRL (DRAPES) ×3 IMPLANT
DECANTER SPIKE VIAL GLASS SM (MISCELLANEOUS) ×6 IMPLANT
DERMABOND ADVANCED (GAUZE/BANDAGES/DRESSINGS) ×1
DERMABOND ADVANCED .7 DNX12 (GAUZE/BANDAGES/DRESSINGS) ×2 IMPLANT
DRSG OPSITE POSTOP 3X4 (GAUZE/BANDAGES/DRESSINGS) ×3 IMPLANT
DURAPREP 26ML APPLICATOR (WOUND CARE) ×3 IMPLANT
ELECT REM PT RETURN 9FT ADLT (ELECTROSURGICAL) ×3
ELECTRODE REM PT RTRN 9FT ADLT (ELECTROSURGICAL) ×2 IMPLANT
FILTER SMOKE EVAC LAPAROSHD (FILTER) ×3 IMPLANT
GAUZE PACKING 2X5 YD STRL (GAUZE/BANDAGES/DRESSINGS) ×3 IMPLANT
GAUZE VASELINE 3X9 (GAUZE/BANDAGES/DRESSINGS) ×3 IMPLANT
GLOVE BIO SURGEON STRL SZ 6.5 (GLOVE) ×9 IMPLANT
GLOVE BIOGEL PI IND STRL 7.0 (GLOVE) ×6 IMPLANT
GLOVE BIOGEL PI INDICATOR 7.0 (GLOVE) ×3
GOWN STRL REUS W/TWL LRG LVL3 (GOWN DISPOSABLE) ×12 IMPLANT
HEMOSTAT ARISTA ABSORB 3G PWDR (MISCELLANEOUS) ×3 IMPLANT
LEGGING LITHOTOMY PAIR STRL (DRAPES) ×3 IMPLANT
NEEDLE HYPO 22GX1.5 SAFETY (NEEDLE) ×3 IMPLANT
NEEDLE INSUFFLATION 120MM (ENDOMECHANICALS) ×3 IMPLANT
NEEDLE MAYO CATGUT SZ4 (NEEDLE) IMPLANT
NEEDLE SPNL 22GX3.5 QUINCKE BK (NEEDLE) ×3 IMPLANT
NS IRRIG 1000ML POUR BTL (IV SOLUTION) ×3 IMPLANT
PACK LAVH (CUSTOM PROCEDURE TRAY) ×3 IMPLANT
PACK ROBOTIC GOWN (GOWN DISPOSABLE) ×3 IMPLANT
PACK TRENDGUARD 450 HYBRID PRO (MISCELLANEOUS) ×2 IMPLANT
PACK VAGINAL WOMENS (CUSTOM PROCEDURE TRAY) ×3 IMPLANT
PROTECTOR NERVE ULNAR (MISCELLANEOUS) ×6 IMPLANT
SET CYSTO W/LG BORE CLAMP LF (SET/KITS/TRAYS/PACK) IMPLANT
SET IRRIG TUBING LAPAROSCOPIC (IRRIGATION / IRRIGATOR) ×3 IMPLANT
SHEARS HARMONIC 9CM CVD (BLADE) ×3 IMPLANT
SHEARS HARMONIC ACE PLUS 36CM (ENDOMECHANICALS) ×3 IMPLANT
SLEEVE XCEL OPT CAN 5 100 (ENDOMECHANICALS) ×6 IMPLANT
SUT PROLENE 1 CTX 30  8455H (SUTURE)
SUT PROLENE 1 CTX 30 8455H (SUTURE) IMPLANT
SUT SILK 0 SH 30 (SUTURE) ×3 IMPLANT
SUT VIC AB 1 CT1 18XBRD ANBCTR (SUTURE) ×4 IMPLANT
SUT VIC AB 1 CT1 8-18 (SUTURE) ×2
SUT VIC AB 2-0 CT1 (SUTURE) ×3 IMPLANT
SUT VIC AB 2-0 CT2 27 (SUTURE) ×21 IMPLANT
SUT VIC AB 3-0 CT1 27 (SUTURE)
SUT VIC AB 3-0 CT1 TAPERPNT 27 (SUTURE) IMPLANT
SUT VIC AB 3-0 SH 27 (SUTURE) ×2
SUT VIC AB 3-0 SH 27X BRD (SUTURE) ×4 IMPLANT
SUT VIC AB 4-0 PS2 27 (SUTURE) ×3 IMPLANT
SUT VICRYL 0 TIES 12 18 (SUTURE) ×3 IMPLANT
SUT VICRYL 0 UR6 27IN ABS (SUTURE) IMPLANT
SYR TB 1ML LUER SLIP (SYRINGE) ×3 IMPLANT
TOWEL OR 17X24 6PK STRL BLUE (TOWEL DISPOSABLE) ×6 IMPLANT
TRAY FOLEY W/BAG SLVR 14FR (SET/KITS/TRAYS/PACK) ×3 IMPLANT
TRENDGUARD 450 HYBRID PRO PACK (MISCELLANEOUS) ×3
TROCAR XCEL NON-BLD 5MMX100MML (ENDOMECHANICALS) ×3 IMPLANT
WARMER LAPAROSCOPE (MISCELLANEOUS) ×3 IMPLANT

## 2018-02-26 NOTE — Op Note (Signed)
NAMEMICKEY, HEBEL MEDICAL RECORD [id number] ACCOUNT [id number] DATE OF BIRTH:30-Oct-1955 FACILITY: [facility] LOCATION: [id number] PHYSICIAN:Lenny Bouchillon BOVARD-STUCKERT, MD  OPERATIVE REPORT  DATE OF PROCEDURE:  02/26/2018  PREOPERATIVE DIAGNOSIS:  Symptomatic pelvic floor relaxation.  POSTOPERATIVE DIAGNOSIS:  Symptomatic pelvic floor relaxation.  PROCEDURE:  Laparoscopic-assisted vaginal hysterectomy with bilateral salpingectomy and anterior and posterior vaginal repair.  SURGEON:  Janyth Contes, MD  ASSISTANT:  Crawford Givens, DO  ANESTHESIA:  Local and general.  ESTIMATED BLOOD LOSS:  Approximately 75 mL.  URINE OUTPUT:  Clear at the end of the procedure per anesthesia records.  IV FLUIDS:  Per anesthesia records.  PATHOLOGY:  Uterus, cervix and bilateral tubes to pathology.  COMPLICATIONS:  None.  DESCRIPTION OF PROCEDURE:  After informed consent was reviewed with the patient and her spouse, she was transported to the operating room table, put in position for surgery.  General anesthesia was then induced and found to be adequate.  She was then  prepped and draped in the normal sterile fashion.  A Foley catheter was sterilely placed.  Using an open-sided speculum, her cervix was easily visualized.  Using a single-tooth tenaculum, her cervix was grasped and the Hulka manipulator was placed.   Gloves and gown were changed.  Attention was turned to the abdominal portion of the case.  An approximately 1.5 cm infraumbilical incision was made, carried through to the underlying layer of fascia.  Using a Veress needle, the pneumoperitoneum was  obtained after passing the hanging drop test.  The pneumoperitoneum was established, and then the 8.5 mm port was placed under direct visualization.  The accessory ports were placed on both the right and left with direct visualization.  The pelvis was  explored.  Normal uterus, tubes and ovaries were noted.  The uterus was noted to be small.  The  tube was grasped with a million dollar and excised to the level of the cornua.  Then the cornua was grasped, and the mesosalpinx and cardinal ligaments were  excised to the level of the bladder.  A bladder flap was then created.  Attention was then turned to the right side, and in a similar fashion, the tube was excised.  The round ligament and mesosalpinx and cardinal ligaments were excised.  The bladder  flap was connected.  Attention was turned to the vaginal portion of the case.  Using a heavy-weighted speculum and a Deaver retractor, the cervix was visualized, grasped with a single-tooth tenaculum and circumscribed with Bovie cautery.  The peritoneum  was entered anteriorly and then posteriorly in a stepwise fashion.  The uterosacral ligaments were ligated on both sides and held with the addition of several other clamps.  The rest of the cardinal ligaments and the uterine artery were ligated.  The  uterus was delivered.  The uterosacral ligaments were plicated after assurance that the pedicles were dry.  The anterior repair was performed.    The mucosa was incised, and  the fascia was reapproximated.  A small amount of mucosa was trimmed.  The fascia was reapproximated with 2-0 Vicryl, and the mucosa was closed with 2-0 Vicryl.  Attention was then turned to the posterior portion, which in a similar fashion the mucosa  was entered.  Using vasopressin, the mucosa was opened to the apex of the defect.  The fascia was then cleared and reapproximated in the midline.  The mucosa was trimmed and reapproximated.  The vagina was packed with Estrace-soaked gauze.  Gloves and  gown were changed.  Attention was turned  back to the abdominal portion of the case.  Several areas of bleeding were noted.  These were made hemostatic by the left ovary as well as on the sidewall.  The ureters were also identified, and Arista was applied  as there was a small area of oozing at the cuff.  The patient tolerated the procedure  well.  Sponge, lap and needle counts were correct x2 per the operating room staff.  LN/NUANCE  D:02/26/2018 T:02/26/2018 JOB:[id number]

## 2018-02-26 NOTE — Anesthesia Procedure Notes (Signed)
Procedure Name: Intubation Date/Time: 02/26/2018 9:12 AM Performed by: Bufford Spikes, CRNA Pre-anesthesia Checklist: Patient identified, Emergency Drugs available, Suction available and Patient being monitored Patient Re-evaluated:Patient Re-evaluated prior to induction Oxygen Delivery Method: Circle system utilized Preoxygenation: Pre-oxygenation with 100% oxygen Induction Type: IV induction Ventilation: Mask ventilation without difficulty Laryngoscope Size: Miller and 2 Grade View: Grade II Tube type: Oral Tube size: 7.0 mm Number of attempts: 1 Airway Equipment and Method: Stylet and Oral airway Placement Confirmation: ETT inserted through vocal cords under direct vision,  positive ETCO2 and breath sounds checked- equal and bilateral Secured at: 21 cm Tube secured with: Tape Dental Injury: Teeth and Oropharynx as per pre-operative assessment

## 2018-02-26 NOTE — Transfer of Care (Signed)
Immediate Anesthesia Transfer of Care Note  Patient: Katelyn [name]  Procedure(s) Performed: LAPAROSCOPIC ASSISTED VAGINAL HYSTERECTOMY WITH SALPINGECTOMY (Bilateral ) ANTERIOR (CYSTOCELE) AND POSTERIOR REPAIR (RECTOCELE) (N/A )  Patient Location: PACU  Anesthesia Type:General  Level of Consciousness: awake, alert  and oriented  Airway & Oxygen Therapy: Patient Spontanous Breathing and Patient connected to nasal cannula oxygen  Post-op Assessment: Report given to RN and Post -op Vital signs reviewed and stable  Post vital signs: Reviewed and stable  Last Vitals:  Vitals Value Taken Time  BP 140/77 02/26/2018 12:16 PM  Temp    Pulse 105 02/26/2018 12:18 PM  Resp 19 02/26/2018 12:18 PM  SpO2 93 % 02/26/2018 12:18 PM  Vitals shown include unvalidated device data.  Last Pain:  Vitals:   02/26/18 0729  TempSrc: Oral  PainSc: 0-No pain      Patients Stated Pain Goal: 3 (16/83/72 9021)  Complications: No apparent anesthesia complications

## 2018-02-26 NOTE — Interval H&P Note (Signed)
History and Physical Interval Note:  02/26/2018 8:39 AM  Katelyn [name]  has presented today for surgery, with the diagnosis of relaxation of pelvic floor  The various methods of treatment have been discussed with the patient and family. After consideration of risks, benefits and other options for treatment, the patient has consented to  Procedure(s): LAPAROSCOPIC ASSISTED VAGINAL HYSTERECTOMY WITH SALPINGECTOMY (Bilateral) ANTERIOR (CYSTOCELE) AND POSTERIOR REPAIR (RECTOCELE) (N/A) as a surgical intervention .  The patient's history has been reviewed, patient examined, no change in status, stable for surgery.  I have reviewed the patient's chart and labs.  Questions were answered to the patient's satisfaction.     Naarah Borgerding Bovard-Stuckert

## 2018-02-26 NOTE — Brief Op Note (Signed)
02/26/2018  12:25 PM  PATIENT:  Katelyn [name]  62 y.o. female  PRE-OPERATIVE DIAGNOSIS:  relaxation of pelvic floor  POST-OPERATIVE DIAGNOSIS:  relaxation of pelvic floor  PROCEDURE:  Procedure(s): LAPAROSCOPIC ASSISTED VAGINAL HYSTERECTOMY WITH SALPINGECTOMY (Bilateral) ANTERIOR (CYSTOCELE) AND POSTERIOR REPAIR (RECTOCELE) (N/A)  SURGEON:  Surgeon(s) and Role:    * Bovard-Stuckert, Myha Arizpe, MD - Primary    * Banga, Bonnee Quin, DO - Assisting  ANESTHESIA:   local and general  EBL:  75 mL uop and IVF per anesthsia  BLOOD ADMINISTERED:none  DRAINS: Urinary Catheter (Foley)   LOCAL MEDICATIONS USED:  MARCAINE     SPECIMEN:  Source of Specimen:  uterus, cervix and B tubes  DISPOSITION OF SPECIMEN:  PATHOLOGY  COUNTS:  YES  TOURNIQUET:  * No tourniquets in log *  DICTATION: .Other Dictation: Dictation Number didn't give #  PLAN OF CARE: Admit for overnight observation  PATIENT DISPOSITION:  PACU - hemodynamically stable.   Delay start of Pharmacological VTE agent (>24hrs) due to surgical blood loss or risk of bleeding: not applicable

## 2018-02-26 NOTE — Plan of Care (Signed)
Pt. Progressing post-op. Tolerting reg diet. Discussed goals of care: OOB, d/c of dPCA and progression to PO meds, removal of packing and foley in the AM and importance of early ambulation.  Pt. In agreement. Will monitor.

## 2018-02-26 NOTE — Progress Notes (Signed)
Day of Surgery Procedure(s) (LRB): LAPAROSCOPIC ASSISTED VAGINAL HYSTERECTOMY WITH SALPINGECTOMY (Bilateral) ANTERIOR (CYSTOCELE) AND POSTERIOR REPAIR (RECTOCELE) (N/A)  Subjective: Patient reports incisional pain and tolerating PO.  Pain controlled.      Objective: I have reviewed patient's vital signs, intake and output, medications and labs.  General: alert and no distress Resp: clear to auscultation bilaterally Cardio: regular rate and rhythm GI: soft, non-tender; bowel sounds normal; no masses,  no organomegaly and incision: clean, dry and intact Extremities: extremities normal, atraumatic, no cyanosis or edema  Assessment: s/p Procedure(s): LAPAROSCOPIC ASSISTED VAGINAL HYSTERECTOMY WITH SALPINGECTOMY (Bilateral) ANTERIOR (CYSTOCELE) AND POSTERIOR REPAIR (RECTOCELE) (N/A): stable, progressing well and tolerating diet  Routine Postop care.    Plan: Advance diet Encourage ambulation maintain foley and pack until AM  LOS: 0 days    Katelyn [name] 02/26/2018, 5:24 PM

## 2018-02-26 NOTE — Anesthesia Postprocedure Evaluation (Signed)
Anesthesia Post Note  Patient: Katelyn [name]  Procedure(s) Performed: LAPAROSCOPIC ASSISTED VAGINAL HYSTERECTOMY WITH SALPINGECTOMY (Bilateral ) ANTERIOR (CYSTOCELE) AND POSTERIOR REPAIR (RECTOCELE) (N/A )     Patient location during evaluation: PACU Anesthesia Type: General Level of consciousness: awake and alert Pain management: pain level controlled Vital Signs Assessment: post-procedure vital signs reviewed and stable Respiratory status: spontaneous breathing, nonlabored ventilation, respiratory function stable and patient connected to nasal cannula oxygen Cardiovascular status: blood pressure returned to baseline and stable Postop Assessment: no apparent nausea or vomiting Anesthetic complications: no    Last Vitals:  Vitals:   02/26/18 1415 02/26/18 1422  BP:  (!) 155/88  Pulse:  (!) 101  Resp: 18   Temp:    SpO2: 100% 94%    Last Pain:  Vitals:   02/26/18 1420  TempSrc:   PainSc: Asleep   Pain Goal: Patients Stated Pain Goal: 3 (02/26/18 1315)               Barnet Glasgow

## 2018-02-27 ENCOUNTER — Encounter (HOSPITAL_COMMUNITY): Payer: Self-pay | Admitting: Obstetrics and Gynecology

## 2018-02-27 DIAGNOSIS — N816 Rectocele: Secondary | ICD-10-CM | POA: Diagnosis not present

## 2018-02-27 DIAGNOSIS — Z6841 Body Mass Index (BMI) 40.0 and over, adult: Secondary | ICD-10-CM | POA: Diagnosis not present

## 2018-02-27 DIAGNOSIS — Z85850 Personal history of malignant neoplasm of thyroid: Secondary | ICD-10-CM | POA: Diagnosis not present

## 2018-02-27 DIAGNOSIS — N800 Endometriosis of uterus: Secondary | ICD-10-CM | POA: Diagnosis not present

## 2018-02-27 DIAGNOSIS — N8110 Cystocele, unspecified: Secondary | ICD-10-CM | POA: Diagnosis not present

## 2018-02-27 DIAGNOSIS — I10 Essential (primary) hypertension: Secondary | ICD-10-CM | POA: Diagnosis not present

## 2018-02-27 LAB — BASIC METABOLIC PANEL
ANION GAP: 9 (ref 5–15)
BUN: 8 mg/dL (ref 6–20)
CHLORIDE: 105 mmol/L (ref 101–111)
CO2: 27 mmol/L (ref 22–32)
Calcium: 8.4 mg/dL — ABNORMAL LOW (ref 8.9–10.3)
Creatinine, Ser: 0.73 mg/dL (ref 0.44–1.00)
GFR calc non Af Amer: >60 mL/min (ref >60)
Glucose, Bld: 109 mg/dL — ABNORMAL HIGH (ref 65–99)
POTASSIUM: 4.3 mmol/L (ref 3.5–5.1)
SODIUM: 141 mmol/L (ref 135–145)

## 2018-02-27 LAB — CBC
HEMATOCRIT: 35.6 % — AB (ref 36.0–46.0)
HEMOGLOBIN: 11.5 g/dL — AB (ref 12.0–15.0)
MCH: 29 pg (ref 26.0–34.0)
MCHC: 32.3 g/dL (ref 30.0–36.0)
MCV: 89.9 fL (ref 78.0–100.0)
Platelets: 217 10*3/uL (ref 150–400)
RBC: 3.96 MIL/uL (ref 3.87–5.11)
RDW: 13.5 % (ref 11.5–15.5)
WBC: 11 10*3/uL — AB (ref 4.0–10.5)

## 2018-02-27 MED ORDER — IBUPROFEN 800 MG PO TABS: 800.0000 mg | ORAL_TABLET | Freq: Three times a day (TID) | ORAL | 0 refills | Status: DC | PRN

## 2018-02-27 MED ORDER — TRAMADOL HCL 50 MG PO TABS: 50.0000 mg | ORAL_TABLET | Freq: Four times a day (QID) | ORAL | Status: DC | PRN

## 2018-02-27 MED ORDER — TRAMADOL HCL 50 MG PO TABS: 50.0000 mg | ORAL_TABLET | Freq: Four times a day (QID) | ORAL | 1 refills | Status: DC | PRN

## 2018-02-27 MED FILL — IBUPROFEN 800 MG TAB: 800 | 10 days supply | Qty: 30 | Fill #0

## 2018-02-27 MED FILL — traMADol HCL 50 MG TABS: 50 | 5 days supply | Qty: 20 | Fill #0

## 2018-02-27 NOTE — Discharge Summary (Signed)
Physician Discharge Summary  Patient ID: Katelyn [name] MRN: [id number] DOB/AGE: January 17, 1956 62 y.o.  Admit date: 02/26/2018 Discharge date: 02/27/2018  Admission Diagnoses:  Discharge Diagnoses:  Principal Problem:   S/P laparoscopic assisted vaginal hysterectomy (LAVH) Active Problems:   Cystocele and rectocele with incomplete uterovaginal prolapse   Discharged Condition: good  Hospital Course: Admitted for LAVH/BS/A&P repair - underwent surgery without complication.  Routine Postop course.  POD#1 ambulating, voiding and pain controlled.  Will d/c  Consults: None  Significant Diagnostic Studies: labs: CBC, BMP  Treatments: IV hydration, analgesia: Dilaudid PCA and tramadol/ibuprofen and surgery: LAVH/BS/S&P repair  Discharge Exam: Blood pressure (!) 144/72, pulse 81, temperature 98.1 F (36.7 C), temperature source Oral, resp. rate 18, height 5' 2.99" (1.6 m), weight 111.1 kg (245 lb), SpO2 95 %. General appearance: alert and no distress Resp: clear to auscultation bilaterally Cardio: regular rate and rhythm GI: soft, non-tender; bowel sounds normal; no masses,  no organomegaly Extremities: extremities normal, atraumatic, no cyanosis or edema  Disposition: Discharge disposition: 01-Home or Self Care       Discharge Instructions    Call MD for:  persistant nausea and vomiting   Complete by:  As directed    Call MD for:  redness, tenderness, or signs of infection (pain, swelling, redness, odor or green/yellow discharge around incision site)   Complete by:  As directed    Call MD for:  severe uncontrolled pain   Complete by:  As directed    Diet - low sodium heart healthy   Complete by:  As directed    Discharge instructions   Complete by:  As directed    Call [phone] with questions or problems   Driving Restrictions   Complete by:  As directed    While taking strong pain medicine   Increase activity slowly   Complete by:  As directed    Lifting restrictions    Complete by:  As directed    No greater than 10-15lbs for 6-8 weeks   May shower / Bathe   Complete by:  As directed    May walk up steps   Complete by:  As directed    Sexual Activity Restrictions   Complete by:  As directed    Pelvic rest - no douching, tampons or sex for 6-8 weeks     Allergies as of 02/27/2018      Reactions   Sulfa Antibiotics Rash      Medication List    TAKE these medications   aspirin EC 81 MG tablet Take 81 mg by mouth daily.   CALCIUM+D3 PO Take 1 tablet by mouth at bedtime.   ibuprofen 800 MG tablet Commonly known as:  ADVIL,MOTRIN Take 1 tablet (800 mg total) by mouth every 8 (eight) hours as needed (mild pain).   levothyroxine 125 MCG tablet Commonly known as:  SYNTHROID, LEVOTHROID Take 125 mcg by mouth daily before breakfast.   losartan 25 MG tablet Commonly known as:  COZAAR Take 25 mg by mouth daily.   metFORMIN 500 MG 24 hr tablet Commonly known as:  GLUCOPHAGE-XR Take 1,000 mg by mouth 2 (two) times daily.   traMADol 50 MG tablet Commonly known as:  ULTRAM Take 1 tablet (50 mg total) by mouth every 6 (six) hours as needed for moderate pain.   VITAMIN B-12 PO Take 1 tablet by mouth at bedtime.   VITAMIN D3 PO Take 1 tablet by mouth 2 (two) times a week.      Follow-up  Information    Bovard-Stuckert, Ruvi Fullenwider, MD. Schedule an appointment as soon as possible for a visit in 2 day(s).   Specialty:  Obstetrics and Gynecology Why:  and 6 weeks for post-op follow-up Contact information: [address] [place] [address] [phone]           Signed: Janyth Contes 02/27/2018, 8:44 AM

## 2018-02-27 NOTE — Progress Notes (Signed)
Discharge instructions discussed with patient. Reviewed medication changes, follow up appointments and postoperative care.

## 2018-02-27 NOTE — Progress Notes (Addendum)
1 Day Post-Op Procedure(s) (LRB): LAPAROSCOPIC ASSISTED VAGINAL HYSTERECTOMY WITH SALPINGECTOMY (Bilateral) ANTERIOR (CYSTOCELE) AND POSTERIOR REPAIR (RECTOCELE) (N/A)  Subjective: Patient reports incisional pain and tolerating PO.  Pain controlled.    Has voided.    Objective: I have reviewed patient's vital signs, intake and output, medications and labs.  General: alert and no distress Resp: clear to auscultation bilaterally Cardio: regular rate and rhythm GI: soft, non-tender; bowel sounds normal; no masses,  no organomegaly Extremities: extremities normal, atraumatic, no cyanosis or edema  Assessment: s/p Procedure(s): LAPAROSCOPIC ASSISTED VAGINAL HYSTERECTOMY WITH SALPINGECTOMY (Bilateral) ANTERIOR (CYSTOCELE) AND POSTERIOR REPAIR (RECTOCELE) (N/A): stable and progressing well  Plan: Encourage ambulation Advance to PO medication Discharge home when ambulating, voiding, tolerating po, pain well controlled w po meds  LOS: 0 days    Katelyn [name] 02/27/2018, 8:29 AM

## 2018-03-17 MED FILL — IBUPROFEN 800 MG TAB: 800 | 15 days supply | Qty: 45 | Fill #0

## 2018-03-27 DIAGNOSIS — Z760 Encounter for issue of repeat prescription: Secondary | ICD-10-CM | POA: Diagnosis not present

## 2018-03-31 MED FILL — LOSARTAN POTASSIUM 25 MG TA: 25 | 30 days supply | Qty: 30 | Fill #3

## 2018-03-31 MED FILL — LEVOTHYROXINE 125 MCG TAB: 125 | 90 days supply | Qty: 90 | Fill #1

## 2018-04-28 MED FILL — LOSARTAN POTASSIUM 25 MG TA: 25 | 30 days supply | Qty: 30 | Fill #0

## 2018-05-26 DIAGNOSIS — E890 Postprocedural hypothyroidism: Secondary | ICD-10-CM | POA: Diagnosis not present

## 2018-05-26 DIAGNOSIS — I10 Essential (primary) hypertension: Secondary | ICD-10-CM | POA: Diagnosis not present

## 2018-05-26 DIAGNOSIS — R7303 Prediabetes: Secondary | ICD-10-CM | POA: Diagnosis not present

## 2018-05-26 DIAGNOSIS — F439 Reaction to severe stress, unspecified: Secondary | ICD-10-CM | POA: Diagnosis not present

## 2018-05-26 DIAGNOSIS — Z85850 Personal history of malignant neoplasm of thyroid: Secondary | ICD-10-CM | POA: Diagnosis not present

## 2018-05-26 MED FILL — LOSARTAN POTASSIUM 25 MG TA: 25 | 90 days supply | Qty: 90 | Fill #0

## 2018-06-23 ENCOUNTER — Telehealth: Payer: 59 | Admitting: Family

## 2018-06-23 DIAGNOSIS — B9789 Other viral agents as the cause of diseases classified elsewhere: Secondary | ICD-10-CM | POA: Diagnosis not present

## 2018-06-23 DIAGNOSIS — J069 Acute upper respiratory infection, unspecified: Secondary | ICD-10-CM

## 2018-06-23 MED ORDER — BENZONATATE 100 MG PO CAPS: 100.0000 mg | ORAL_CAPSULE | Freq: Three times a day (TID) | ORAL | 0 refills | Status: DC | PRN

## 2018-06-23 MED ORDER — FLUTICASONE PROPIONATE 50 MCG/ACT NA SUSP: 2.0000 | Freq: Every day | NASAL | 6 refills | Status: DC

## 2018-06-23 NOTE — Progress Notes (Signed)

## 2018-06-24 MED FILL — BENZONATATE 100 MG CAP: 100 | 7 days supply | Qty: 20 | Fill #0

## 2018-06-24 MED FILL — FLUTICASONE PROP 50 MCG SPR: 50 | 30 days supply | Qty: 16 | Fill #0

## 2018-07-01 MED FILL — ESCITALOPRAM 10 MG TABLET: 10 | 90 days supply | Qty: 90 | Fill #0

## 2018-07-11 MED FILL — LEVOTHYROXINE 125 MCG TAB: 125 | 90 days supply | Qty: 90 | Fill #2

## 2018-08-08 DIAGNOSIS — N8189 Other female genital prolapse: Secondary | ICD-10-CM | POA: Diagnosis not present

## 2018-08-08 DIAGNOSIS — N898 Other specified noninflammatory disorders of vagina: Secondary | ICD-10-CM | POA: Diagnosis not present

## 2018-08-08 DIAGNOSIS — F339 Major depressive disorder, recurrent, unspecified: Secondary | ICD-10-CM | POA: Diagnosis not present

## 2018-08-08 DIAGNOSIS — Z1389 Encounter for screening for other disorder: Secondary | ICD-10-CM | POA: Diagnosis not present

## 2018-08-08 DIAGNOSIS — Z130 Encounter for screening for diseases of the blood and blood-forming organs and certain disorders involving the immune mechanism: Secondary | ICD-10-CM | POA: Diagnosis not present

## 2018-08-08 DIAGNOSIS — Z01419 Encounter for gynecological examination (general) (routine) without abnormal findings: Secondary | ICD-10-CM | POA: Diagnosis not present

## 2018-08-08 DIAGNOSIS — Z124 Encounter for screening for malignant neoplasm of cervix: Secondary | ICD-10-CM | POA: Diagnosis not present

## 2018-08-08 DIAGNOSIS — Z6839 Body mass index (BMI) 39.0-39.9, adult: Secondary | ICD-10-CM | POA: Diagnosis not present

## 2018-08-08 DIAGNOSIS — Z1231 Encounter for screening mammogram for malignant neoplasm of breast: Secondary | ICD-10-CM | POA: Diagnosis not present

## 2018-08-18 ENCOUNTER — Other Ambulatory Visit: Payer: Self-pay | Admitting: Obstetrics and Gynecology

## 2018-08-18 DIAGNOSIS — R928 Other abnormal and inconclusive findings on diagnostic imaging of breast: Secondary | ICD-10-CM

## 2018-08-19 ENCOUNTER — Ambulatory Visit
Admission: RE | Admit: 2018-08-19 | Discharge: 2018-08-19 | Disposition: A | Discharge status: Home / Self Care | Payer: 59 | Source: Ambulatory Visit | Attending: Obstetrics and Gynecology | Admitting: Obstetrics and Gynecology

## 2018-08-19 ENCOUNTER — Ambulatory Visit: Payer: 59

## 2018-08-19 DIAGNOSIS — R928 Other abnormal and inconclusive findings on diagnostic imaging of breast: Secondary | ICD-10-CM | POA: Diagnosis not present

## 2018-09-01 MED FILL — LOSARTAN POTASSIUM 25 MG TA: 25 | 90 days supply | Qty: 90 | Fill #1

## 2018-09-24 MED FILL — ESCITALOPRAM 10 MG TABLET: 10 | 90 days supply | Qty: 90 | Fill #0

## 2018-10-13 MED FILL — LEVOTHYROXINE 125 MCG TAB: 125 | 90 days supply | Qty: 90 | Fill #3

## 2018-10-22 DIAGNOSIS — Z23 Encounter for immunization: Secondary | ICD-10-CM | POA: Diagnosis not present

## 2018-11-26 MED FILL — LOSARTAN POTASSIUM 25 MG TA: 25 | 90 days supply | Qty: 90 | Fill #2

## 2018-11-28 DIAGNOSIS — M25511 Pain in right shoulder: Secondary | ICD-10-CM | POA: Diagnosis not present

## 2018-11-28 DIAGNOSIS — M25551 Pain in right hip: Secondary | ICD-10-CM | POA: Diagnosis not present

## 2018-11-28 MED FILL — MELOXICAM 15 MG TABLET: 15 | 30 days supply | Qty: 30 | Fill #0

## 2018-12-19 DIAGNOSIS — Z85850 Personal history of malignant neoplasm of thyroid: Secondary | ICD-10-CM | POA: Diagnosis not present

## 2018-12-19 DIAGNOSIS — E890 Postprocedural hypothyroidism: Secondary | ICD-10-CM | POA: Diagnosis not present

## 2018-12-19 DIAGNOSIS — R7303 Prediabetes: Secondary | ICD-10-CM | POA: Diagnosis not present

## 2018-12-22 DIAGNOSIS — E669 Obesity, unspecified: Secondary | ICD-10-CM | POA: Diagnosis not present

## 2018-12-22 DIAGNOSIS — R7303 Prediabetes: Secondary | ICD-10-CM | POA: Diagnosis not present

## 2018-12-22 DIAGNOSIS — Z85850 Personal history of malignant neoplasm of thyroid: Secondary | ICD-10-CM | POA: Diagnosis not present

## 2018-12-22 DIAGNOSIS — E890 Postprocedural hypothyroidism: Secondary | ICD-10-CM | POA: Diagnosis not present

## 2018-12-22 MED FILL — LEVOTHYROXINE 112 MCG TAB: 112 | 90 days supply | Qty: 90 | Fill #0

## 2018-12-24 MED FILL — ESCITALOPRAM 10 MG TABLET: 10 | 90 days supply | Qty: 90 | Fill #1

## 2018-12-25 DIAGNOSIS — R7303 Prediabetes: Secondary | ICD-10-CM | POA: Diagnosis not present

## 2018-12-25 DIAGNOSIS — E890 Postprocedural hypothyroidism: Secondary | ICD-10-CM | POA: Diagnosis not present

## 2018-12-25 DIAGNOSIS — Z8639 Personal history of other endocrine, nutritional and metabolic disease: Secondary | ICD-10-CM | POA: Diagnosis not present

## 2018-12-25 DIAGNOSIS — I10 Essential (primary) hypertension: Secondary | ICD-10-CM | POA: Diagnosis not present

## 2018-12-25 DIAGNOSIS — Z85850 Personal history of malignant neoplasm of thyroid: Secondary | ICD-10-CM | POA: Diagnosis not present

## 2018-12-25 DIAGNOSIS — F439 Reaction to severe stress, unspecified: Secondary | ICD-10-CM | POA: Diagnosis not present

## 2019-02-23 DIAGNOSIS — E890 Postprocedural hypothyroidism: Secondary | ICD-10-CM | POA: Diagnosis not present

## 2019-02-23 DIAGNOSIS — Z85850 Personal history of malignant neoplasm of thyroid: Secondary | ICD-10-CM | POA: Diagnosis not present

## 2019-03-04 MED FILL — LOSARTAN POTASSIUM 25 MG TA: 25 | 90 days supply | Qty: 90 | Fill #0

## 2019-03-18 DIAGNOSIS — Z23 Encounter for immunization: Secondary | ICD-10-CM | POA: Diagnosis not present

## 2019-03-23 MED FILL — ESCITALOPRAM 10 MG TABLET: 10 | 90 days supply | Qty: 90 | Fill #2

## 2019-03-23 MED FILL — LEVOTHYROXINE 112 MCG TAB: 112 | 90 days supply | Qty: 90 | Fill #1

## 2019-04-07 DIAGNOSIS — M9901 Segmental and somatic dysfunction of cervical region: Secondary | ICD-10-CM | POA: Diagnosis not present

## 2019-04-07 DIAGNOSIS — M5442 Lumbago with sciatica, left side: Secondary | ICD-10-CM | POA: Diagnosis not present

## 2019-04-07 DIAGNOSIS — S4351XA Sprain of right acromioclavicular joint, initial encounter: Secondary | ICD-10-CM | POA: Diagnosis not present

## 2019-04-07 DIAGNOSIS — M5412 Radiculopathy, cervical region: Secondary | ICD-10-CM | POA: Diagnosis not present

## 2019-04-07 DIAGNOSIS — M9903 Segmental and somatic dysfunction of lumbar region: Secondary | ICD-10-CM | POA: Diagnosis not present

## 2019-04-07 DIAGNOSIS — M9907 Segmental and somatic dysfunction of upper extremity: Secondary | ICD-10-CM | POA: Diagnosis not present

## 2019-04-08 DIAGNOSIS — S4351XA Sprain of right acromioclavicular joint, initial encounter: Secondary | ICD-10-CM | POA: Diagnosis not present

## 2019-04-08 DIAGNOSIS — M9907 Segmental and somatic dysfunction of upper extremity: Secondary | ICD-10-CM | POA: Diagnosis not present

## 2019-04-08 DIAGNOSIS — M5442 Lumbago with sciatica, left side: Secondary | ICD-10-CM | POA: Diagnosis not present

## 2019-04-08 DIAGNOSIS — M9903 Segmental and somatic dysfunction of lumbar region: Secondary | ICD-10-CM | POA: Diagnosis not present

## 2019-04-08 DIAGNOSIS — M9901 Segmental and somatic dysfunction of cervical region: Secondary | ICD-10-CM | POA: Diagnosis not present

## 2019-04-08 DIAGNOSIS — M5412 Radiculopathy, cervical region: Secondary | ICD-10-CM | POA: Diagnosis not present

## 2019-04-15 DIAGNOSIS — M5442 Lumbago with sciatica, left side: Secondary | ICD-10-CM | POA: Diagnosis not present

## 2019-04-15 DIAGNOSIS — M9903 Segmental and somatic dysfunction of lumbar region: Secondary | ICD-10-CM | POA: Diagnosis not present

## 2019-04-15 DIAGNOSIS — M9907 Segmental and somatic dysfunction of upper extremity: Secondary | ICD-10-CM | POA: Diagnosis not present

## 2019-04-15 DIAGNOSIS — M5412 Radiculopathy, cervical region: Secondary | ICD-10-CM | POA: Diagnosis not present

## 2019-04-15 DIAGNOSIS — M9901 Segmental and somatic dysfunction of cervical region: Secondary | ICD-10-CM | POA: Diagnosis not present

## 2019-04-15 DIAGNOSIS — S4351XA Sprain of right acromioclavicular joint, initial encounter: Secondary | ICD-10-CM | POA: Diagnosis not present

## 2019-04-20 DIAGNOSIS — M9901 Segmental and somatic dysfunction of cervical region: Secondary | ICD-10-CM | POA: Diagnosis not present

## 2019-04-20 DIAGNOSIS — M9903 Segmental and somatic dysfunction of lumbar region: Secondary | ICD-10-CM | POA: Diagnosis not present

## 2019-04-20 DIAGNOSIS — S4351XA Sprain of right acromioclavicular joint, initial encounter: Secondary | ICD-10-CM | POA: Diagnosis not present

## 2019-04-20 DIAGNOSIS — M5442 Lumbago with sciatica, left side: Secondary | ICD-10-CM | POA: Diagnosis not present

## 2019-04-20 DIAGNOSIS — M9907 Segmental and somatic dysfunction of upper extremity: Secondary | ICD-10-CM | POA: Diagnosis not present

## 2019-04-20 DIAGNOSIS — M5412 Radiculopathy, cervical region: Secondary | ICD-10-CM | POA: Diagnosis not present

## 2019-04-21 DIAGNOSIS — M9907 Segmental and somatic dysfunction of upper extremity: Secondary | ICD-10-CM | POA: Diagnosis not present

## 2019-04-21 DIAGNOSIS — M5412 Radiculopathy, cervical region: Secondary | ICD-10-CM | POA: Diagnosis not present

## 2019-04-21 DIAGNOSIS — M5442 Lumbago with sciatica, left side: Secondary | ICD-10-CM | POA: Diagnosis not present

## 2019-04-21 DIAGNOSIS — M9903 Segmental and somatic dysfunction of lumbar region: Secondary | ICD-10-CM | POA: Diagnosis not present

## 2019-04-21 DIAGNOSIS — S4351XA Sprain of right acromioclavicular joint, initial encounter: Secondary | ICD-10-CM | POA: Diagnosis not present

## 2019-04-21 DIAGNOSIS — M9901 Segmental and somatic dysfunction of cervical region: Secondary | ICD-10-CM | POA: Diagnosis not present

## 2019-04-22 DIAGNOSIS — M9901 Segmental and somatic dysfunction of cervical region: Secondary | ICD-10-CM | POA: Diagnosis not present

## 2019-04-22 DIAGNOSIS — M9907 Segmental and somatic dysfunction of upper extremity: Secondary | ICD-10-CM | POA: Diagnosis not present

## 2019-04-22 DIAGNOSIS — M5412 Radiculopathy, cervical region: Secondary | ICD-10-CM | POA: Diagnosis not present

## 2019-04-22 DIAGNOSIS — M9903 Segmental and somatic dysfunction of lumbar region: Secondary | ICD-10-CM | POA: Diagnosis not present

## 2019-04-22 DIAGNOSIS — M5442 Lumbago with sciatica, left side: Secondary | ICD-10-CM | POA: Diagnosis not present

## 2019-04-22 DIAGNOSIS — S4351XA Sprain of right acromioclavicular joint, initial encounter: Secondary | ICD-10-CM | POA: Diagnosis not present

## 2019-04-27 DIAGNOSIS — M9907 Segmental and somatic dysfunction of upper extremity: Secondary | ICD-10-CM | POA: Diagnosis not present

## 2019-04-27 DIAGNOSIS — M9903 Segmental and somatic dysfunction of lumbar region: Secondary | ICD-10-CM | POA: Diagnosis not present

## 2019-04-27 DIAGNOSIS — S4351XA Sprain of right acromioclavicular joint, initial encounter: Secondary | ICD-10-CM | POA: Diagnosis not present

## 2019-04-27 DIAGNOSIS — M5412 Radiculopathy, cervical region: Secondary | ICD-10-CM | POA: Diagnosis not present

## 2019-04-27 DIAGNOSIS — M5442 Lumbago with sciatica, left side: Secondary | ICD-10-CM | POA: Diagnosis not present

## 2019-04-27 DIAGNOSIS — M9901 Segmental and somatic dysfunction of cervical region: Secondary | ICD-10-CM | POA: Diagnosis not present

## 2019-04-28 DIAGNOSIS — M9901 Segmental and somatic dysfunction of cervical region: Secondary | ICD-10-CM | POA: Diagnosis not present

## 2019-04-28 DIAGNOSIS — M5442 Lumbago with sciatica, left side: Secondary | ICD-10-CM | POA: Diagnosis not present

## 2019-04-28 DIAGNOSIS — M9907 Segmental and somatic dysfunction of upper extremity: Secondary | ICD-10-CM | POA: Diagnosis not present

## 2019-04-28 DIAGNOSIS — S4351XA Sprain of right acromioclavicular joint, initial encounter: Secondary | ICD-10-CM | POA: Diagnosis not present

## 2019-04-28 DIAGNOSIS — M9903 Segmental and somatic dysfunction of lumbar region: Secondary | ICD-10-CM | POA: Diagnosis not present

## 2019-04-28 DIAGNOSIS — M5412 Radiculopathy, cervical region: Secondary | ICD-10-CM | POA: Diagnosis not present

## 2019-04-29 DIAGNOSIS — M5442 Lumbago with sciatica, left side: Secondary | ICD-10-CM | POA: Diagnosis not present

## 2019-04-29 DIAGNOSIS — M9903 Segmental and somatic dysfunction of lumbar region: Secondary | ICD-10-CM | POA: Diagnosis not present

## 2019-04-29 DIAGNOSIS — M9901 Segmental and somatic dysfunction of cervical region: Secondary | ICD-10-CM | POA: Diagnosis not present

## 2019-04-29 DIAGNOSIS — S4351XA Sprain of right acromioclavicular joint, initial encounter: Secondary | ICD-10-CM | POA: Diagnosis not present

## 2019-04-29 DIAGNOSIS — M5412 Radiculopathy, cervical region: Secondary | ICD-10-CM | POA: Diagnosis not present

## 2019-04-29 DIAGNOSIS — M9907 Segmental and somatic dysfunction of upper extremity: Secondary | ICD-10-CM | POA: Diagnosis not present

## 2019-05-04 DIAGNOSIS — M5412 Radiculopathy, cervical region: Secondary | ICD-10-CM | POA: Diagnosis not present

## 2019-05-04 DIAGNOSIS — S4351XA Sprain of right acromioclavicular joint, initial encounter: Secondary | ICD-10-CM | POA: Diagnosis not present

## 2019-05-04 DIAGNOSIS — M5442 Lumbago with sciatica, left side: Secondary | ICD-10-CM | POA: Diagnosis not present

## 2019-05-04 DIAGNOSIS — M9907 Segmental and somatic dysfunction of upper extremity: Secondary | ICD-10-CM | POA: Diagnosis not present

## 2019-05-04 DIAGNOSIS — M9901 Segmental and somatic dysfunction of cervical region: Secondary | ICD-10-CM | POA: Diagnosis not present

## 2019-05-04 DIAGNOSIS — M9903 Segmental and somatic dysfunction of lumbar region: Secondary | ICD-10-CM | POA: Diagnosis not present

## 2019-05-05 DIAGNOSIS — M5442 Lumbago with sciatica, left side: Secondary | ICD-10-CM | POA: Diagnosis not present

## 2019-05-05 DIAGNOSIS — M9907 Segmental and somatic dysfunction of upper extremity: Secondary | ICD-10-CM | POA: Diagnosis not present

## 2019-05-05 DIAGNOSIS — M9903 Segmental and somatic dysfunction of lumbar region: Secondary | ICD-10-CM | POA: Diagnosis not present

## 2019-05-05 DIAGNOSIS — M9901 Segmental and somatic dysfunction of cervical region: Secondary | ICD-10-CM | POA: Diagnosis not present

## 2019-05-05 DIAGNOSIS — S4351XA Sprain of right acromioclavicular joint, initial encounter: Secondary | ICD-10-CM | POA: Diagnosis not present

## 2019-05-05 DIAGNOSIS — M5412 Radiculopathy, cervical region: Secondary | ICD-10-CM | POA: Diagnosis not present

## 2019-05-06 DIAGNOSIS — M9903 Segmental and somatic dysfunction of lumbar region: Secondary | ICD-10-CM | POA: Diagnosis not present

## 2019-05-06 DIAGNOSIS — M9907 Segmental and somatic dysfunction of upper extremity: Secondary | ICD-10-CM | POA: Diagnosis not present

## 2019-05-06 DIAGNOSIS — M9901 Segmental and somatic dysfunction of cervical region: Secondary | ICD-10-CM | POA: Diagnosis not present

## 2019-05-06 DIAGNOSIS — S4351XA Sprain of right acromioclavicular joint, initial encounter: Secondary | ICD-10-CM | POA: Diagnosis not present

## 2019-05-06 DIAGNOSIS — M5442 Lumbago with sciatica, left side: Secondary | ICD-10-CM | POA: Diagnosis not present

## 2019-05-06 DIAGNOSIS — M5412 Radiculopathy, cervical region: Secondary | ICD-10-CM | POA: Diagnosis not present

## 2019-05-12 DIAGNOSIS — M9903 Segmental and somatic dysfunction of lumbar region: Secondary | ICD-10-CM | POA: Diagnosis not present

## 2019-05-12 DIAGNOSIS — M5442 Lumbago with sciatica, left side: Secondary | ICD-10-CM | POA: Diagnosis not present

## 2019-05-12 DIAGNOSIS — M9901 Segmental and somatic dysfunction of cervical region: Secondary | ICD-10-CM | POA: Diagnosis not present

## 2019-05-12 DIAGNOSIS — M9907 Segmental and somatic dysfunction of upper extremity: Secondary | ICD-10-CM | POA: Diagnosis not present

## 2019-05-12 DIAGNOSIS — M5412 Radiculopathy, cervical region: Secondary | ICD-10-CM | POA: Diagnosis not present

## 2019-05-12 DIAGNOSIS — S4351XA Sprain of right acromioclavicular joint, initial encounter: Secondary | ICD-10-CM | POA: Diagnosis not present

## 2019-06-03 DIAGNOSIS — D22112 Melanocytic nevi of right lower eyelid, including canthus: Secondary | ICD-10-CM | POA: Diagnosis not present

## 2019-06-03 DIAGNOSIS — L570 Actinic keratosis: Secondary | ICD-10-CM | POA: Diagnosis not present

## 2019-06-03 DIAGNOSIS — L738 Other specified follicular disorders: Secondary | ICD-10-CM | POA: Diagnosis not present

## 2019-06-03 DIAGNOSIS — D2361 Other benign neoplasm of skin of right upper limb, including shoulder: Secondary | ICD-10-CM | POA: Diagnosis not present

## 2019-06-03 DIAGNOSIS — L700 Acne vulgaris: Secondary | ICD-10-CM | POA: Diagnosis not present

## 2019-06-03 MED FILL — LOSARTAN POTASSIUM 25 MG TA: 25 | 30 days supply | Qty: 30 | Fill #0

## 2019-06-03 MED FILL — ESCITALOPRAM 10 MG TABLET: 10 | 90 days supply | Qty: 90 | Fill #3

## 2019-06-03 MED FILL — LEVOTHYROXINE 112 MCG TAB: 112 | 90 days supply | Qty: 90 | Fill #2

## 2019-06-29 MED FILL — LOSARTAN POTASSIUM 25 MG TA: 25 | 30 days supply | Qty: 30 | Fill #1

## 2019-07-28 ENCOUNTER — Other Ambulatory Visit: Payer: Self-pay

## 2019-07-28 DIAGNOSIS — Z20828 Contact with and (suspected) exposure to other viral communicable diseases: Secondary | ICD-10-CM | POA: Diagnosis not present

## 2019-07-28 DIAGNOSIS — Z20822 Contact with and (suspected) exposure to covid-19: Secondary | ICD-10-CM

## 2019-07-30 LAB — NOVEL CORONAVIRUS, NAA: SARS-CoV-2, NAA: NOT DETECTED

## 2019-08-04 MED FILL — LOSARTAN POTASSIUM 25 MG TA: 25 | 30 days supply | Qty: 30 | Fill #2

## 2019-09-02 MED FILL — LOSARTAN POTASSIUM 25 MG TA: 25 | 30 days supply | Qty: 30 | Fill #3

## 2019-09-17 MED FILL — LEVOTHYROXINE 112 MCG TAB: 112 | 90 days supply | Qty: 90 | Fill #3

## 2019-09-28 DIAGNOSIS — F339 Major depressive disorder, recurrent, unspecified: Secondary | ICD-10-CM | POA: Diagnosis not present

## 2019-09-28 DIAGNOSIS — R7302 Impaired glucose tolerance (oral): Secondary | ICD-10-CM | POA: Diagnosis not present

## 2019-09-28 DIAGNOSIS — Z6841 Body Mass Index (BMI) 40.0 and over, adult: Secondary | ICD-10-CM | POA: Diagnosis not present

## 2019-09-28 DIAGNOSIS — Z1231 Encounter for screening mammogram for malignant neoplasm of breast: Secondary | ICD-10-CM | POA: Diagnosis not present

## 2019-09-28 DIAGNOSIS — Z01419 Encounter for gynecological examination (general) (routine) without abnormal findings: Secondary | ICD-10-CM | POA: Diagnosis not present

## 2019-09-28 DIAGNOSIS — Z1389 Encounter for screening for other disorder: Secondary | ICD-10-CM | POA: Diagnosis not present

## 2019-09-28 DIAGNOSIS — Z130 Encounter for screening for diseases of the blood and blood-forming organs and certain disorders involving the immune mechanism: Secondary | ICD-10-CM | POA: Diagnosis not present

## 2019-09-28 MED FILL — metFORMIN HCL 1000 MG TABS: 1000 | 90 days supply | Qty: 180 | Fill #0

## 2019-09-28 MED FILL — ESCITALOPRAM 10 MG TABLET: 10 | 90 days supply | Qty: 90 | Fill #0

## 2019-09-30 MED FILL — LOSARTAN POTASSIUM 25 MG TA: 25 | 30 days supply | Qty: 30 | Fill #4

## 2019-10-28 DIAGNOSIS — F439 Reaction to severe stress, unspecified: Secondary | ICD-10-CM | POA: Diagnosis not present

## 2019-10-28 DIAGNOSIS — E669 Obesity, unspecified: Secondary | ICD-10-CM | POA: Diagnosis not present

## 2019-10-28 DIAGNOSIS — I10 Essential (primary) hypertension: Secondary | ICD-10-CM | POA: Diagnosis not present

## 2019-10-28 DIAGNOSIS — E890 Postprocedural hypothyroidism: Secondary | ICD-10-CM | POA: Diagnosis not present

## 2019-10-28 DIAGNOSIS — R7303 Prediabetes: Secondary | ICD-10-CM | POA: Diagnosis not present

## 2019-10-28 MED FILL — LOSARTAN POTASSIUM 50 MG TA: 50 | 90 days supply | Qty: 90 | Fill #0

## 2019-10-29 ENCOUNTER — Emergency Department (HOSPITAL_BASED_OUTPATIENT_CLINIC_OR_DEPARTMENT_OTHER): Payer: 59

## 2019-10-29 ENCOUNTER — Encounter (HOSPITAL_BASED_OUTPATIENT_CLINIC_OR_DEPARTMENT_OTHER): Payer: Self-pay | Admitting: Emergency Medicine

## 2019-10-29 ENCOUNTER — Emergency Department (HOSPITAL_BASED_OUTPATIENT_CLINIC_OR_DEPARTMENT_OTHER)
Admission: EM | Admit: 2019-10-29 | Discharge: 2019-10-29 | Disposition: A | Discharge status: Home / Self Care | Payer: 59 | Attending: Emergency Medicine | Admitting: Emergency Medicine

## 2019-10-29 ENCOUNTER — Other Ambulatory Visit: Payer: Self-pay

## 2019-10-29 DIAGNOSIS — H81399 Other peripheral vertigo, unspecified ear: Secondary | ICD-10-CM | POA: Diagnosis not present

## 2019-10-29 DIAGNOSIS — Z7982 Long term (current) use of aspirin: Secondary | ICD-10-CM | POA: Diagnosis not present

## 2019-10-29 DIAGNOSIS — Z888 Allergy status to other drugs, medicaments and biological substances status: Secondary | ICD-10-CM | POA: Diagnosis not present

## 2019-10-29 DIAGNOSIS — I10 Essential (primary) hypertension: Secondary | ICD-10-CM

## 2019-10-29 DIAGNOSIS — E039 Hypothyroidism, unspecified: Secondary | ICD-10-CM | POA: Diagnosis not present

## 2019-10-29 DIAGNOSIS — Z85850 Personal history of malignant neoplasm of thyroid: Secondary | ICD-10-CM | POA: Diagnosis not present

## 2019-10-29 DIAGNOSIS — Z79899 Other long term (current) drug therapy: Secondary | ICD-10-CM | POA: Diagnosis not present

## 2019-10-29 DIAGNOSIS — R42 Dizziness and giddiness: Secondary | ICD-10-CM | POA: Diagnosis not present

## 2019-10-29 DIAGNOSIS — Z882 Allergy status to sulfonamides status: Secondary | ICD-10-CM | POA: Diagnosis not present

## 2019-10-29 LAB — CBC WITH DIFFERENTIAL/PLATELET
Abs Immature Granulocytes: 0.04 10*3/uL (ref 0.00–0.07)
Basophils Absolute: 0 10*3/uL (ref 0.0–0.1)
Basophils Relative: 0 %
Eosinophils Absolute: 0.1 10*3/uL (ref 0.0–0.5)
Eosinophils Relative: 1 %
HCT: 41.9 % (ref 36.0–46.0)
Hemoglobin: 13.2 g/dL (ref 12.0–15.0)
Immature Granulocytes: 1 %
Lymphocytes Relative: 14 %
Lymphs Abs: 1.3 10*3/uL (ref 0.7–4.0)
MCH: 28 pg (ref 26.0–34.0)
MCHC: 31.5 g/dL (ref 30.0–36.0)
MCV: 88.8 fL (ref 80.0–100.0)
Monocytes Absolute: 0.4 10*3/uL (ref 0.1–1.0)
Monocytes Relative: 4 %
Neutro Abs: 7 10*3/uL (ref 1.7–7.7)
Neutrophils Relative %: 80 %
Platelets: 290 10*3/uL (ref 150–400)
RBC: 4.72 MIL/uL (ref 3.87–5.11)
RDW: 13.3 % (ref 11.5–15.5)
WBC: 8.8 10*3/uL (ref 4.0–10.5)
nRBC: 0 % (ref 0.0–0.2)

## 2019-10-29 LAB — BASIC METABOLIC PANEL
Anion gap: 9 (ref 5–15)
BUN: 9 mg/dL (ref 8–23)
CO2: 26 mmol/L (ref 22–32)
Calcium: 8.7 mg/dL — ABNORMAL LOW (ref 8.9–10.3)
Chloride: 104 mmol/L (ref 98–111)
Creatinine, Ser: 0.68 mg/dL (ref 0.44–1.00)
GFR calc Af Amer: >60 mL/min (ref >60)
GFR calc non Af Amer: >60 mL/min (ref >60)
Glucose, Bld: 122 mg/dL — ABNORMAL HIGH (ref 70–99)
Potassium: 4.3 mmol/L (ref 3.5–5.1)
Sodium: 139 mmol/L (ref 135–145)

## 2019-10-29 LAB — TROPONIN I (HIGH SENSITIVITY): Troponin I (High Sensitivity): 2 ng/L (ref <18)

## 2019-10-29 MED ORDER — SODIUM CHLORIDE 0.9 % IV BOLUS
1000.0000 mL | Freq: Once | INTRAVENOUS | Status: AC
  Administered 2019-10-29: 1000 mL via INTRAVENOUS

## 2019-10-29 MED ORDER — MECLIZINE HCL 25 MG PO TABS: 25.0000 mg | ORAL_TABLET | Freq: Three times a day (TID) | ORAL | 0 refills | Status: DC | PRN

## 2019-10-29 MED ORDER — ONDANSETRON HCL 4 MG/2ML IJ SOLN
4.0000 mg | Freq: Once | INTRAMUSCULAR | Status: AC
  Administered 2019-10-29: 10:00:00 4 mg via INTRAVENOUS
  Filled 2019-10-29: qty 2

## 2019-10-29 MED ORDER — MECLIZINE HCL 25 MG PO TABS
25.0000 mg | ORAL_TABLET | Freq: Once | ORAL | Status: AC
  Administered 2019-10-29: 25 mg via ORAL
  Filled 2019-10-29: qty 1

## 2019-10-29 MED FILL — MECLIZINE 25 MG TABLET: 25 | 5 days supply | Qty: 15 | Fill #0

## 2019-10-29 NOTE — ED Notes (Signed)
Pt taken to CT, EKG will done when he returns. RN Vincente Liberty informed.

## 2019-10-29 NOTE — ED Triage Notes (Signed)
Pt states she is having some dizziness and nausea on Tuesday.  Pt states her BP at home has been higher and saw her PCP yesterday and her Losartan was increased to 50mg  daily.  Pt still having nausea, vomiting and dizziness.  No chest pain, no sob.

## 2019-10-29 NOTE — ED Notes (Signed)
Pt on monitor 

## 2019-10-29 NOTE — ED Provider Notes (Signed)
[facility] EMERGENCY DEPARTMENT Provider Note   CSN: [id number] Arrival date & time: 10/29/19  [id number]     History Chief Complaint  Patient presents with  . Hypertension    Katelyn [name] is a 64 y.o. female.  Patient is a 64 year old female with history of hypertension, hypothyroidism, prediabetes.  She presents today for evaluation of dizziness, nausea/vomiting, and elevated blood pressure.  She has noted these symptoms for the past 3 days.  She increased her losartan yesterday, however her symptoms persist.  She describes episodes of dizziness that she describes as a spinning sensation that caused her to feel nauseated and she has vomited on several occasions.  This seems to occur when she turns her head or changes position.  She denies fevers or chills.  She denies chest pain or cough.  The history is provided by the patient.       Past Medical History:  Diagnosis Date  . Cancer [facility])    thyroid cancer  . Hypertension   . Hypothyroidism   . PONV (postoperative nausea and vomiting)   . Pre-diabetes   . S/P laparoscopic assisted vaginal hysterectomy (LAVH) 02/26/2018  . Seasonal allergies   . SVD (spontaneous vaginal delivery)    x 2    Patient Active Problem List   Diagnosis Date Noted  . S/P laparoscopic assisted vaginal hysterectomy (LAVH) 02/26/2018  . Cystocele and rectocele with incomplete uterovaginal prolapse 02/26/2018    Past Surgical History:  Procedure Laterality Date  . ANTERIOR AND POSTERIOR REPAIR N/A 02/26/2018   Procedure: ANTERIOR (CYSTOCELE) AND POSTERIOR REPAIR (RECTOCELE);  Surgeon: Janyth Contes, MD;  Location: [facility];  Service: Gynecology;  Laterality: N/A;  . COLONOSCOPY    . LAPAROSCOPIC VAGINAL HYSTERECTOMY WITH SALPINGECTOMY Bilateral 02/26/2018   Procedure: LAPAROSCOPIC ASSISTED VAGINAL HYSTERECTOMY WITH SALPINGECTOMY;  Surgeon: Janyth Contes, MD;  Location: [facility];  Service: Gynecology;  Laterality: Bilateral;  .  TONSILLECTOMY    . TOTAL THYROIDECTOMY     in [place]  . WISDOM TOOTH EXTRACTION       OB History   No obstetric history on file.     History reviewed. No pertinent family history.  Social History   Tobacco Use  . Smoking status: Never Smoker  . Smokeless tobacco: Never Used  Substance Use Topics  . Alcohol use: Never  . Drug use: Never    Home Medications Prior to Admission medications   Medication Sig Start Date End Date Taking? Authorizing Provider  aspirin EC 81 MG tablet Take 81 mg by mouth daily.    [provider]  benzonatate (TESSALON PERLES) 100 MG capsule Take 1 capsule (100 mg total) by mouth 3 (three) times daily as needed. 06/23/18   Evelina Dun A, FNP  Calcium Carb-Cholecalciferol (CALCIUM+D3 PO) Take 1 tablet by mouth at bedtime.    [provider]  Cholecalciferol (VITAMIN D3 PO) Take 1 tablet by mouth 2 (two) times a week.    [provider]  Cyanocobalamin (VITAMIN B-12 PO) Take 1 tablet by mouth at bedtime.    [provider]  fluticasone (FLONASE) 50 MCG/ACT nasal spray Place 2 sprays into both nostrils daily. 06/23/18   Sharion Balloon, FNP  ibuprofen (ADVIL,MOTRIN) 800 MG tablet Take 1 tablet (800 mg total) by mouth every 8 (eight) hours as needed (mild pain). 02/27/18   Bovard-Stuckert, Jeral Fruit, MD  levothyroxine (SYNTHROID, LEVOTHROID) 125 MCG tablet Take 125 mcg by mouth daily before breakfast.    [provider]  losartan (COZAAR)  25 MG tablet Take 25 mg by mouth daily.    [provider]  metFORMIN (GLUCOPHAGE-XR) 500 MG 24 hr tablet Take 1,000 mg by mouth 2 (two) times daily. 01/17/18   [provider]  traMADol (ULTRAM) 50 MG tablet Take 1 tablet (50 mg total) by mouth every 6 (six) hours as needed for moderate pain. 02/27/18   Bovard-Stuckert, Jeral Fruit, MD    Allergies    Lisinopril and Sulfa antibiotics  Review of Systems   Review of Systems  All other systems reviewed and are  negative.   Physical Exam Updated Vital Signs BP (!) 213/97 (BP Location: Right Arm)   Pulse 80   Temp 97.7 F (36.5 C) (Oral)   Resp 16   Ht 5\' 4"  (1.626 m)   Wt 122.5 kg   LMP  (LMP Unknown)   BMI 46.35 kg/m   Physical Exam Vitals and nursing note reviewed.  Constitutional:      General: She is not in acute distress.    Appearance: She is well-developed. She is not diaphoretic.  HENT:     Head: Normocephalic and atraumatic.  Eyes:     Extraocular Movements: Extraocular movements intact.     Pupils: Pupils are equal, round, and reactive to light.  Cardiovascular:     Rate and Rhythm: Normal rate and regular rhythm.     Heart sounds: No murmur. No friction rub. No gallop.   Pulmonary:     Effort: Pulmonary effort is normal. No respiratory distress.     Breath sounds: Normal breath sounds. No wheezing.  Abdominal:     General: Bowel sounds are normal. There is no distension.     Palpations: Abdomen is soft.     Tenderness: There is no abdominal tenderness.  Musculoskeletal:        General: Normal range of motion.     Cervical back: Normal range of motion and neck supple.  Skin:    General: Skin is warm and dry.  Neurological:     General: No focal deficit present.     Mental Status: She is alert and oriented to person, place, and time.     Cranial Nerves: No cranial nerve deficit.     Sensory: No sensory deficit.     Motor: No weakness.     Coordination: Coordination normal.     ED Results / Procedures / Treatments   Labs (all labs ordered are listed, but only abnormal results are displayed) Labs Reviewed  BASIC METABOLIC PANEL  CBC WITH DIFFERENTIAL/PLATELET  TROPONIN I (HIGH SENSITIVITY)    EKG EKG Interpretation  Date/Time:  Thursday October 29 2019 10:16:05 EST Ventricular Rate:  77 PR Interval:    QRS Duration: 94 QT Interval:  399 QTC Calculation: 452 R Axis:   25 Text Interpretation: Sinus rhythm Normal ECG Confirmed by Veryl Speak [phone])  on 10/29/2019 10:52:33 AM   Radiology No results found.  Procedures Procedures (including critical care time)  Medications Ordered in ED Medications  sodium chloride 0.9 % bolus 1,000 mL (has no administration in time range)  ondansetron (ZOFRAN) injection 4 mg (has no administration in time range)  meclizine (ANTIVERT) tablet 25 mg (has no administration in time range)    ED Course  I have reviewed the triage vital signs and the nursing notes.  Pertinent labs & imaging results that were available during my care of the patient were reviewed by me and considered in my medical decision making (see chart for details).  MDM Rules/Calculators/A&P  Patient presents here with complaints of dizziness and elevated blood pressure. From her description of the dizziness, it sounds like peripheral vertigo.  Workup today shows no other etiology, including labs, EKG, and CT of the head.    She was given IVF and meclizine here in the ED and is feeling considerably better.  BP now [id number] systolic.  I suspect that her symptoms are primarily related to peripheral vertigo rather than hypertensive urgency.  I suspect her blood pressure is elevated as a result of feeling badly from the vertigo.  Patient will be discharged with meclizine and is to follow-up as needed.  Final Clinical Impression(s) / ED Diagnoses Final diagnoses:  None    Rx / DC Orders ED Discharge Orders    None       Veryl Speak, MD 10/29/19 1141

## 2019-10-29 NOTE — Discharge Instructions (Addendum)
Take meclizine as prescribed as needed for dizziness.  Keep a record of your blood pressures and take this with you to your next doctor's appointment to discuss.  Return to the emergency department in the meantime if symptoms significantly worsen or change.

## 2019-10-29 NOTE — ED Notes (Signed)
Pt sts she feels much better after IVFs and meds

## 2019-10-30 DIAGNOSIS — I10 Essential (primary) hypertension: Secondary | ICD-10-CM | POA: Diagnosis not present

## 2019-10-30 MED FILL — LOSARTAN POTASSIUM 100 MG T: 100 | 90 days supply | Qty: 90 | Fill #0

## 2019-11-12 DIAGNOSIS — I10 Essential (primary) hypertension: Secondary | ICD-10-CM | POA: Diagnosis not present

## 2019-11-12 MED FILL — HYDROCHLOROTHIAZIDE 25 MG T: 25 | 90 days supply | Qty: 45 | Fill #0

## 2019-12-15 DIAGNOSIS — Z13220 Encounter for screening for lipoid disorders: Secondary | ICD-10-CM | POA: Diagnosis not present

## 2019-12-15 DIAGNOSIS — Z85850 Personal history of malignant neoplasm of thyroid: Secondary | ICD-10-CM | POA: Diagnosis not present

## 2019-12-15 DIAGNOSIS — R7303 Prediabetes: Secondary | ICD-10-CM | POA: Diagnosis not present

## 2019-12-15 DIAGNOSIS — E890 Postprocedural hypothyroidism: Secondary | ICD-10-CM | POA: Diagnosis not present

## 2019-12-15 MED FILL — LOSARTAN POTASSIUM 100 MG T: 100 | 90 days supply | Qty: 90 | Fill #0

## 2019-12-22 ENCOUNTER — Other Ambulatory Visit (HOSPITAL_COMMUNITY): Payer: Self-pay | Admitting: Internal Medicine

## 2019-12-22 DIAGNOSIS — Z85850 Personal history of malignant neoplasm of thyroid: Secondary | ICD-10-CM | POA: Diagnosis not present

## 2019-12-22 DIAGNOSIS — E785 Hyperlipidemia, unspecified: Secondary | ICD-10-CM | POA: Diagnosis not present

## 2019-12-22 DIAGNOSIS — E890 Postprocedural hypothyroidism: Secondary | ICD-10-CM | POA: Diagnosis not present

## 2019-12-22 DIAGNOSIS — R7303 Prediabetes: Secondary | ICD-10-CM | POA: Diagnosis not present

## 2019-12-22 DIAGNOSIS — E669 Obesity, unspecified: Secondary | ICD-10-CM | POA: Diagnosis not present

## 2019-12-22 MED FILL — METFORMIN HCL ER 500 MG TB2: 500 | 90 days supply | Qty: 360 | Fill #0

## 2019-12-22 MED FILL — LEVOTHYROXINE SODIUM 112 MC: 112 | 90 days supply | Qty: 90 | Fill #0

## 2019-12-22 MED FILL — ATORVASTATIN 10 MG TABLET: 10 | 90 days supply | Qty: 90 | Fill #0

## 2019-12-29 MED FILL — ESCITALOPRAM 10 MG TABLET: 10 | 90 days supply | Qty: 90 | Fill #1

## 2020-01-07 ENCOUNTER — Other Ambulatory Visit (HOSPITAL_COMMUNITY): Payer: Self-pay | Admitting: Family Medicine

## 2020-01-07 DIAGNOSIS — I10 Essential (primary) hypertension: Secondary | ICD-10-CM | POA: Diagnosis not present

## 2020-01-07 MED FILL — LOSARTAN-HCTZ 100-12.5 MG T: 100-12.5 | 90 days supply | Qty: 90 | Fill #0

## 2020-01-12 DIAGNOSIS — H43821 Vitreomacular adhesion, right eye: Secondary | ICD-10-CM | POA: Diagnosis not present

## 2020-01-12 DIAGNOSIS — H524 Presbyopia: Secondary | ICD-10-CM | POA: Diagnosis not present

## 2020-01-12 DIAGNOSIS — H43392 Other vitreous opacities, left eye: Secondary | ICD-10-CM | POA: Diagnosis not present

## 2020-01-12 DIAGNOSIS — H34232 Retinal artery branch occlusion, left eye: Secondary | ICD-10-CM | POA: Diagnosis not present

## 2020-02-02 DIAGNOSIS — H348322 Tributary (branch) retinal vein occlusion, left eye, stable: Secondary | ICD-10-CM | POA: Diagnosis not present

## 2020-02-02 DIAGNOSIS — H3562 Retinal hemorrhage, left eye: Secondary | ICD-10-CM | POA: Diagnosis not present

## 2020-02-02 DIAGNOSIS — H35341 Macular cyst, hole, or pseudohole, right eye: Secondary | ICD-10-CM | POA: Diagnosis not present

## 2020-02-04 MED FILL — OFLOXACIN 0.3% EYE DROPS: 0.3 | 25 days supply | Qty: 5 | Fill #0

## 2020-02-04 MED FILL — DUREZOL 0.05% EYE DROPS: 0.05 | 50 days supply | Qty: 5 | Fill #0

## 2020-02-11 DIAGNOSIS — H35341 Macular cyst, hole, or pseudohole, right eye: Secondary | ICD-10-CM | POA: Diagnosis not present

## 2020-02-12 DIAGNOSIS — H35341 Macular cyst, hole, or pseudohole, right eye: Secondary | ICD-10-CM | POA: Diagnosis not present

## 2020-02-19 DIAGNOSIS — H35341 Macular cyst, hole, or pseudohole, right eye: Secondary | ICD-10-CM | POA: Diagnosis not present

## 2020-03-09 DIAGNOSIS — H348322 Tributary (branch) retinal vein occlusion, left eye, stable: Secondary | ICD-10-CM | POA: Diagnosis not present

## 2020-03-09 DIAGNOSIS — H35341 Macular cyst, hole, or pseudohole, right eye: Secondary | ICD-10-CM | POA: Diagnosis not present

## 2020-03-17 MED FILL — LEVOTHYROXINE SODIUM 112 MC: 112 | 90 days supply | Qty: 90 | Fill #1

## 2020-03-29 ENCOUNTER — Other Ambulatory Visit (HOSPITAL_COMMUNITY): Payer: Self-pay | Admitting: Nurse Practitioner

## 2020-03-29 DIAGNOSIS — I10 Essential (primary) hypertension: Secondary | ICD-10-CM | POA: Diagnosis not present

## 2020-03-29 MED FILL — ESCITALOPRAM 10 MG TABLET: 10 | 90 days supply | Qty: 90 | Fill #2

## 2020-03-29 MED FILL — LOSARTAN-HCTZ 100-12.5 MG T: 100-12.5 | 90 days supply | Qty: 90 | Fill #1

## 2020-03-31 MED FILL — ATORVASTATIN CALCIUM 10 MG: 10 | 90 days supply | Qty: 90 | Fill #1

## 2020-06-16 DIAGNOSIS — Z13220 Encounter for screening for lipoid disorders: Secondary | ICD-10-CM | POA: Diagnosis not present

## 2020-06-16 DIAGNOSIS — I10 Essential (primary) hypertension: Secondary | ICD-10-CM | POA: Diagnosis not present

## 2020-06-16 MED FILL — LEVOTHYROXINE SODIUM 112 MC: 112 | 90 days supply | Qty: 90 | Fill #2

## 2020-06-22 DIAGNOSIS — L720 Epidermal cyst: Secondary | ICD-10-CM | POA: Diagnosis not present

## 2020-06-22 DIAGNOSIS — D225 Melanocytic nevi of trunk: Secondary | ICD-10-CM | POA: Diagnosis not present

## 2020-06-22 DIAGNOSIS — L578 Other skin changes due to chronic exposure to nonionizing radiation: Secondary | ICD-10-CM | POA: Diagnosis not present

## 2020-06-22 DIAGNOSIS — Z0000 Encounter for general adult medical examination without abnormal findings: Secondary | ICD-10-CM | POA: Diagnosis not present

## 2020-06-22 DIAGNOSIS — D2372 Other benign neoplasm of skin of left lower limb, including hip: Secondary | ICD-10-CM | POA: Diagnosis not present

## 2020-06-22 DIAGNOSIS — D2272 Melanocytic nevi of left lower limb, including hip: Secondary | ICD-10-CM | POA: Diagnosis not present

## 2020-06-22 DIAGNOSIS — D224 Melanocytic nevi of scalp and neck: Secondary | ICD-10-CM | POA: Diagnosis not present

## 2020-06-29 MED FILL — ESCITALOPRAM 10 MG TABLET: 10 | 90 days supply | Qty: 90 | Fill #3

## 2020-07-07 MED FILL — ESCITALOPRAM 10 MG TABLET: 10 | 90 days supply | Qty: 90 | Fill #3

## 2020-07-11 MED FILL — LOSARTAN-HCTZ 100-12.5 MG T: 100-12.5 | 90 days supply | Qty: 90 | Fill #2

## 2020-08-09 MED FILL — METFORMIN HCL ER 500 MG TB2: 500 | 90 days supply | Qty: 360 | Fill #1

## 2020-09-01 DIAGNOSIS — H524 Presbyopia: Secondary | ICD-10-CM | POA: Diagnosis not present

## 2020-09-01 DIAGNOSIS — H3562 Retinal hemorrhage, left eye: Secondary | ICD-10-CM | POA: Diagnosis not present

## 2020-09-01 DIAGNOSIS — H34232 Retinal artery branch occlusion, left eye: Secondary | ICD-10-CM | POA: Diagnosis not present

## 2020-09-01 DIAGNOSIS — H2511 Age-related nuclear cataract, right eye: Secondary | ICD-10-CM | POA: Diagnosis not present

## 2020-09-01 DIAGNOSIS — H11002 Unspecified pterygium of left eye: Secondary | ICD-10-CM | POA: Diagnosis not present

## 2020-09-01 DIAGNOSIS — H43392 Other vitreous opacities, left eye: Secondary | ICD-10-CM | POA: Diagnosis not present

## 2020-09-13 MED FILL — ATORVASTATIN CALCIUM 10 MG: 10 | 90 days supply | Qty: 90 | Fill #2

## 2020-09-13 MED FILL — LEVOTHYROXINE SODIUM 112 MC: 112 | 90 days supply | Qty: 90 | Fill #3

## 2020-10-03 ENCOUNTER — Other Ambulatory Visit (HOSPITAL_COMMUNITY): Payer: Self-pay | Admitting: Obstetrics and Gynecology

## 2020-10-03 DIAGNOSIS — F339 Major depressive disorder, recurrent, unspecified: Secondary | ICD-10-CM | POA: Diagnosis not present

## 2020-10-03 DIAGNOSIS — Z6841 Body Mass Index (BMI) 40.0 and over, adult: Secondary | ICD-10-CM | POA: Diagnosis not present

## 2020-10-03 DIAGNOSIS — Z1389 Encounter for screening for other disorder: Secondary | ICD-10-CM | POA: Diagnosis not present

## 2020-10-03 DIAGNOSIS — Z1231 Encounter for screening mammogram for malignant neoplasm of breast: Secondary | ICD-10-CM | POA: Diagnosis not present

## 2020-10-03 DIAGNOSIS — I10 Essential (primary) hypertension: Secondary | ICD-10-CM | POA: Diagnosis not present

## 2020-10-03 DIAGNOSIS — Z01419 Encounter for gynecological examination (general) (routine) without abnormal findings: Secondary | ICD-10-CM | POA: Diagnosis not present

## 2020-10-03 DIAGNOSIS — Z130 Encounter for screening for diseases of the blood and blood-forming organs and certain disorders involving the immune mechanism: Secondary | ICD-10-CM | POA: Diagnosis not present

## 2020-10-03 MED FILL — ESCITALOPRAM 10 MG TABLET: 10 | 90 days supply | Qty: 90 | Fill #0

## 2020-10-10 MED FILL — LOSARTAN-HCTZ 100-12.5 MG T: 100-12.5 | 30 days supply | Qty: 30 | Fill #3

## 2020-10-12 ENCOUNTER — Other Ambulatory Visit: Payer: Self-pay | Admitting: Obstetrics and Gynecology

## 2020-10-12 DIAGNOSIS — R928 Other abnormal and inconclusive findings on diagnostic imaging of breast: Secondary | ICD-10-CM

## 2020-10-28 ENCOUNTER — Ambulatory Visit: Payer: 59

## 2020-10-28 ENCOUNTER — Ambulatory Visit
Admission: RE | Admit: 2020-10-28 | Discharge: 2020-10-28 | Disposition: A | Discharge status: Home / Self Care | Payer: 59 | Source: Ambulatory Visit | Attending: Obstetrics and Gynecology | Admitting: Obstetrics and Gynecology

## 2020-10-28 ENCOUNTER — Other Ambulatory Visit: Payer: Self-pay

## 2020-10-28 DIAGNOSIS — R928 Other abnormal and inconclusive findings on diagnostic imaging of breast: Secondary | ICD-10-CM | POA: Diagnosis not present

## 2020-11-09 MED FILL — LOSARTAN-HCTZ 100-12.5 MG T: 100-12.5 | 30 days supply | Qty: 30 | Fill #4

## 2020-11-16 DIAGNOSIS — K635 Polyp of colon: Secondary | ICD-10-CM | POA: Diagnosis not present

## 2020-11-16 DIAGNOSIS — Z86010 Personal history of colonic polyps: Secondary | ICD-10-CM | POA: Diagnosis not present

## 2020-11-16 DIAGNOSIS — Z1211 Encounter for screening for malignant neoplasm of colon: Secondary | ICD-10-CM | POA: Diagnosis not present

## 2020-11-16 DIAGNOSIS — D123 Benign neoplasm of transverse colon: Secondary | ICD-10-CM | POA: Diagnosis not present

## 2020-12-12 MED FILL — LEVOTHYROXINE SODIUM 112 MC: 112 | 90 days supply | Qty: 90 | Fill #4

## 2020-12-12 MED FILL — ATORVASTATIN CALCIUM 10 MG: 10 | 90 days supply | Qty: 90 | Fill #3

## 2020-12-12 MED FILL — LOSARTAN-HCTZ 100-12.5 MG T: 100-12.5 | 30 days supply | Qty: 30 | Fill #5

## 2020-12-16 DIAGNOSIS — R7303 Prediabetes: Secondary | ICD-10-CM | POA: Diagnosis not present

## 2020-12-16 DIAGNOSIS — E890 Postprocedural hypothyroidism: Secondary | ICD-10-CM | POA: Diagnosis not present

## 2020-12-16 DIAGNOSIS — Z85850 Personal history of malignant neoplasm of thyroid: Secondary | ICD-10-CM | POA: Diagnosis not present

## 2020-12-16 DIAGNOSIS — E785 Hyperlipidemia, unspecified: Secondary | ICD-10-CM | POA: Diagnosis not present

## 2020-12-20 ENCOUNTER — Other Ambulatory Visit (HOSPITAL_COMMUNITY): Payer: Self-pay | Admitting: Nurse Practitioner

## 2020-12-20 DIAGNOSIS — I10 Essential (primary) hypertension: Secondary | ICD-10-CM | POA: Diagnosis not present

## 2021-01-03 ENCOUNTER — Other Ambulatory Visit (HOSPITAL_COMMUNITY): Payer: Self-pay | Admitting: Internal Medicine

## 2021-01-03 MED FILL — METFORMIN HCL ER 500 MG TB2: 500 | 90 days supply | Qty: 360 | Fill #0

## 2021-01-03 MED FILL — ESCITALOPRAM 10 MG TABLET: 10 | 90 days supply | Qty: 90 | Fill #1

## 2021-01-05 MED FILL — LOSARTAN-HCTZ 100-12.5 MG T: 100-12.5 | 30 days supply | Qty: 30 | Fill #0

## 2021-01-09 DIAGNOSIS — R7303 Prediabetes: Secondary | ICD-10-CM | POA: Diagnosis not present

## 2021-01-09 DIAGNOSIS — Z85850 Personal history of malignant neoplasm of thyroid: Secondary | ICD-10-CM | POA: Diagnosis not present

## 2021-01-09 DIAGNOSIS — E785 Hyperlipidemia, unspecified: Secondary | ICD-10-CM | POA: Diagnosis not present

## 2021-01-09 DIAGNOSIS — E890 Postprocedural hypothyroidism: Secondary | ICD-10-CM | POA: Diagnosis not present

## 2021-01-09 DIAGNOSIS — E669 Obesity, unspecified: Secondary | ICD-10-CM | POA: Diagnosis not present

## 2021-01-09 DIAGNOSIS — Z7984 Long term (current) use of oral hypoglycemic drugs: Secondary | ICD-10-CM | POA: Diagnosis not present

## 2021-02-06 ENCOUNTER — Other Ambulatory Visit (HOSPITAL_COMMUNITY): Payer: Self-pay

## 2021-02-06 MED FILL — Losartan Potassium & Hydrochlorothiazide Tab 100-12.5 MG: ORAL | 30 days supply | Qty: 30 | Fill #0 | Status: AC

## 2021-03-03 ENCOUNTER — Other Ambulatory Visit (HOSPITAL_COMMUNITY): Payer: Self-pay | Admitting: Internal Medicine

## 2021-03-03 ENCOUNTER — Other Ambulatory Visit (HOSPITAL_COMMUNITY): Payer: Self-pay

## 2021-03-03 MED ORDER — ATORVASTATIN CALCIUM 10 MG PO TABS
10.0000 mg | ORAL_TABLET | Freq: Every day | ORAL | 3 refills | Status: DC
  Filled 2021-03-03: qty 90, 90d supply, fill #0
  Filled 2021-06-10: qty 90, 90d supply, fill #1
  Filled 2021-09-26: qty 90, 90d supply, fill #2
  Filled 2021-12-20: qty 90, 90d supply, fill #3

## 2021-03-03 MED FILL — Losartan Potassium & Hydrochlorothiazide Tab 100-12.5 MG: ORAL | 30 days supply | Qty: 30 | Fill #1 | Status: AC

## 2021-03-04 ENCOUNTER — Other Ambulatory Visit (HOSPITAL_COMMUNITY): Payer: Self-pay

## 2021-03-04 ENCOUNTER — Other Ambulatory Visit (HOSPITAL_COMMUNITY): Payer: Self-pay | Admitting: Internal Medicine

## 2021-03-06 ENCOUNTER — Other Ambulatory Visit (HOSPITAL_COMMUNITY): Payer: Self-pay | Admitting: Internal Medicine

## 2021-03-06 ENCOUNTER — Other Ambulatory Visit (HOSPITAL_COMMUNITY): Payer: Self-pay

## 2021-03-07 ENCOUNTER — Other Ambulatory Visit (HOSPITAL_COMMUNITY): Payer: Self-pay

## 2021-03-07 DIAGNOSIS — J0141 Acute recurrent pansinusitis: Secondary | ICD-10-CM | POA: Diagnosis not present

## 2021-03-07 DIAGNOSIS — R059 Cough, unspecified: Secondary | ICD-10-CM | POA: Diagnosis not present

## 2021-03-07 MED ORDER — AMOXICILLIN-POT CLAVULANATE 500-125 MG PO TABS
1.0000 | ORAL_TABLET | Freq: Three times a day (TID) | ORAL | 0 refills | Status: AC
  Filled 2021-03-07: qty 30, 10d supply, fill #0

## 2021-03-07 MED ORDER — BENZONATATE 100 MG PO CAPS
100.0000 mg | ORAL_CAPSULE | Freq: Every evening | ORAL | 0 refills | Status: DC | PRN
  Filled 2021-03-07: qty 20, 10d supply, fill #0

## 2021-03-08 ENCOUNTER — Other Ambulatory Visit (HOSPITAL_COMMUNITY): Payer: Self-pay

## 2021-03-08 ENCOUNTER — Other Ambulatory Visit (HOSPITAL_COMMUNITY): Payer: Self-pay | Admitting: Internal Medicine

## 2021-03-08 MED ORDER — LEVOTHYROXINE SODIUM 112 MCG PO TABS
112.0000 ug | ORAL_TABLET | Freq: Every day | ORAL | 4 refills | Status: DC
  Filled 2021-03-08: qty 90, 90d supply, fill #0
  Filled 2021-06-10: qty 90, 90d supply, fill #1
  Filled 2021-09-08: qty 90, 90d supply, fill #2
  Filled 2021-11-29: qty 90, 90d supply, fill #3
  Filled 2022-02-28: qty 90, 90d supply, fill #4

## 2021-03-22 ENCOUNTER — Other Ambulatory Visit (HOSPITAL_COMMUNITY): Payer: Self-pay

## 2021-03-22 DIAGNOSIS — R059 Cough, unspecified: Secondary | ICD-10-CM | POA: Diagnosis not present

## 2021-03-22 DIAGNOSIS — K224 Dyskinesia of esophagus: Secondary | ICD-10-CM | POA: Diagnosis not present

## 2021-03-22 DIAGNOSIS — J9801 Acute bronchospasm: Secondary | ICD-10-CM | POA: Diagnosis not present

## 2021-03-22 MED ORDER — ALBUTEROL SULFATE HFA 108 (90 BASE) MCG/ACT IN AERS
1.0000 | INHALATION_SPRAY | RESPIRATORY_TRACT | 5 refills | Status: DC | PRN
  Filled 2021-03-22: qty 18, 33d supply, fill #0
  Filled 2021-11-30: qty 18, 33d supply, fill #1

## 2021-03-22 MED ORDER — FLOVENT HFA 110 MCG/ACT IN AERO
2.0000 | INHALATION_SPRAY | Freq: Two times a day (BID) | RESPIRATORY_TRACT | 5 refills | Status: DC
  Filled 2021-03-22: qty 12, 30d supply, fill #0

## 2021-04-04 DIAGNOSIS — J3489 Other specified disorders of nose and nasal sinuses: Secondary | ICD-10-CM | POA: Diagnosis not present

## 2021-04-04 DIAGNOSIS — R053 Chronic cough: Secondary | ICD-10-CM | POA: Diagnosis not present

## 2021-04-04 DIAGNOSIS — R490 Dysphonia: Secondary | ICD-10-CM | POA: Diagnosis not present

## 2021-04-04 DIAGNOSIS — J384 Edema of larynx: Secondary | ICD-10-CM | POA: Diagnosis not present

## 2021-04-04 DIAGNOSIS — K219 Gastro-esophageal reflux disease without esophagitis: Secondary | ICD-10-CM | POA: Diagnosis present

## 2021-04-07 ENCOUNTER — Other Ambulatory Visit: Payer: Self-pay

## 2021-04-07 ENCOUNTER — Ambulatory Visit (INDEPENDENT_AMBULATORY_CARE_PROVIDER_SITE_OTHER): Payer: 59 | Admitting: Internal Medicine

## 2021-04-07 ENCOUNTER — Institutional Professional Consult (permissible substitution): Payer: 59 | Admitting: Internal Medicine

## 2021-04-07 ENCOUNTER — Encounter: Payer: Self-pay | Admitting: Internal Medicine

## 2021-04-07 ENCOUNTER — Other Ambulatory Visit (HOSPITAL_COMMUNITY): Payer: Self-pay

## 2021-04-07 VITALS — BP 130/84 | HR 74 | Ht 64.5 in | Wt 263.6 lb

## 2021-04-07 DIAGNOSIS — R053 Chronic cough: Secondary | ICD-10-CM | POA: Diagnosis not present

## 2021-04-07 DIAGNOSIS — R059 Cough, unspecified: Secondary | ICD-10-CM

## 2021-04-07 DIAGNOSIS — J387 Other diseases of larynx: Secondary | ICD-10-CM

## 2021-04-07 LAB — CBC WITH DIFFERENTIAL/PLATELET
Basophils Absolute: 0 10*3/uL (ref 0.0–0.1)
Basophils Relative: 0.4 % (ref 0.0–3.0)
Eosinophils Absolute: 0.3 10*3/uL (ref 0.0–0.7)
Eosinophils Relative: 3.7 % (ref 0.0–5.0)
HCT: 36.9 % (ref 36.0–46.0)
Hemoglobin: 12.4 g/dL (ref 12.0–15.0)
Lymphocytes Relative: 25.9 % (ref 12.0–46.0)
Lymphs Abs: 1.8 10*3/uL (ref 0.7–4.0)
MCHC: 33.4 g/dL (ref 30.0–36.0)
MCV: 85.3 fl (ref 78.0–100.0)
Monocytes Absolute: 0.5 10*3/uL (ref 0.1–1.0)
Monocytes Relative: 7.6 % (ref 3.0–12.0)
Neutro Abs: 4.3 10*3/uL (ref 1.4–7.7)
Neutrophils Relative %: 62.4 % (ref 43.0–77.0)
Platelets: 302 10*3/uL (ref 150.0–400.0)
RBC: 4.33 Mil/uL (ref 3.87–5.11)
RDW: 14.4 % (ref 11.5–15.5)
WBC: 6.8 10*3/uL (ref 4.0–10.5)

## 2021-04-07 LAB — NITRIC OXIDE: Nitric Oxide: 9

## 2021-04-07 MED ORDER — BISOPROLOL FUMARATE 5 MG PO TABS
5.0000 mg | ORAL_TABLET | Freq: Every day | ORAL | 3 refills | Status: DC
  Filled 2021-04-07: qty 30, 30d supply, fill #0

## 2021-04-07 NOTE — Patient Instructions (Signed)
ICD-10-CM   1. Cough  R05.9 Nitric oxide    2. Chronic cough  R05.3     3. Irritable larynx syndrome  J38.7       Cough is called yclical cough/LPR cough or cough neuropathy or irritable larynx syndrome Unclear if losartan is playing a role but possibly so Retest probability for asthma at this point is low but still not fully ruled out Acid reflux could be playing a role based on inflamed vocal cords seen by ENT per your report  Plan  -Check CBC with differential and blood IgE -Check high-resolution CT scan of the chest supine and prone -Stop losartan and instead start bisoprolol 5 mg once daily  -Continue to monitor your blood pressure at home frequently -Stop Flovent but  cntinue other medications including Flonase as per ENT advise  -If you do not have asthma Flovent could be irritating to the vocal cords -Refill voice rehab Garald Balding -Other nonpharmaceutical interventions for cough include  -Take 2-3 days of voice rest without any talking or whispering  -Drink cold water when he had a urged to cough  -Swallow saliva when he had a urged to cough  -Talk less frequently as possible  -Suck on sugarless lozenges when he have urged to cough   Follow-up - Return to see nurse practitioner in 4 to 6 weeks to evaluate blood pressure control and also cough improvement. -Based on follow-up consider gabapentin if needed

## 2021-04-07 NOTE — Progress Notes (Signed)
OV 04/07/2021  Subjective:  Patient ID: Katelyn [name], female , DOB: [birth year] , age 65 y.o. , MRN: [id number] , ADDRESS: [place] [id number] PCP Orpah Melter, MD Patient Care Team: Orpah Melter, MD as PCP - General (Family Medicine)  This Provider for this visit: Treatment Team:  Attending Provider: Brand Males, MD    04/07/2021 -   Chief Complaint  Patient presents with   Consult    Patient reports cough x 1 year, non productive, some smells make it worse with the cough,      HPI Katelyn [name] 65 y.o. -presents for evaluation of chronic cough.  I took care of her of her husband 20th-3 years ago in the ICU and he passed away.  Shortly after that her mother also passed away.  She had prolonged grief reaction.  She just came off Lexapro.  She finds fulfillment with the help of her children and her grandchildren.  She has 4-5 grandchildren at this point.  She works as a Dealer at [employer].  She tells me that a few years ago she was on lisinopril and had chronic cough and the cough resolved when she stopped lisinopril.  After that she went on losartan.  She is currently on losartan.  For the last 1 year she has had insidious onset of chronic cough the cough is the same and feels similar to lisinopril cough.  She is wondering if losartan can be causing the cough.  She says the cough is significant.  Symptom severity is below.  Stable since onset.  Cough is mostly in the daytime but sometimes wakes her up at night.  She has laryngeal quality to the cough.  Made worse by talking or laughing.  Made better by staying quiet and drinking cold water.  She not able to talk for a long time and zoom meetings.  This makes her cough and want to reach out to water.  Her voice gets hoarse.  Its a dry cough.  There is no wheezing or shortness of breath.    Cough related issue  - she has seen ENT and apparently vocal cords are red.  Unclear if she has  acid reflux.  - Of note few to several years ago she had thyroidectomy without any problems.  A few years ago she had intubation for the procedure and after that she has not been able to sing  -Unclear if she has acid reflux  -She reports no asthma but has seasonal allergies.  Previous blood eosinophils normal.  Today nitric oxide 9 ppb in and normal  -She does have hypertension on losartan.  Previously had cough with lisinopril.   Dr Lorenza Cambridge Reflux Symptom Index (> 13-15 suggestive of LPR cough) 0 -> 5  =  none ->severe problem  Hoarseness of problem with voice 3  Clearing  Of Throat 4  Excess throat mucus or feeling of post nasal drip 3  Difficulty swallowing food, liquid or tablets 0  Cough after eating or lying down 5  Breathing difficulties or choking episodes 0  Troublesome or annoying cough 5  Sensation of something sticking in throat or lump in throat 5  Heartburn, chest pain, indigestion, or stomach acid coming up 1  TOTAL 26    Results for Katelyn [name] (MRN [id number]) as of 04/07/2021 09:52  Ref. Range 10/29/2019 10:05  Eosinophils Absolute Latest Ref Range: 0.0 - 0.5 K/uL 0.1    CT  Chest data  No results found.    PFT  No flowsheet data found.     has a past medical history of Cancer ([place]), Hypertension, Hypothyroidism, PONV (postoperative nausea and vomiting), Pre-diabetes, S/P laparoscopic assisted vaginal hysterectomy (LAVH) (02/26/2018), Seasonal allergies, and SVD (spontaneous vaginal delivery).   reports that she has never smoked. She has never used smokeless tobacco.  Past Surgical History:  Procedure Laterality Date   ANTERIOR AND POSTERIOR REPAIR N/A 02/26/2018   Procedure: ANTERIOR (CYSTOCELE) AND POSTERIOR REPAIR (RECTOCELE);  Surgeon: Janyth Contes, MD;  Location: [facility];  Service: Gynecology;  Laterality: N/A;   COLONOSCOPY     LAPAROSCOPIC VAGINAL HYSTERECTOMY WITH SALPINGECTOMY Bilateral 02/26/2018   Procedure: LAPAROSCOPIC ASSISTED  VAGINAL HYSTERECTOMY WITH SALPINGECTOMY;  Surgeon: Janyth Contes, MD;  Location: [facility];  Service: Gynecology;  Laterality: Bilateral;   TONSILLECTOMY     TOTAL THYROIDECTOMY     in [place]   WISDOM TOOTH EXTRACTION      Allergies  Allergen Reactions   Lisinopril Cough   Sulfa Antibiotics Rash    Immunization History  Administered Date(s) Administered   Hepatitis B, adult 04/22/1985, 05/19/1985, 10/22/1985   Influenza Whole 07/16/2011, 07/23/2012, 08/03/2013, 07/06/2014   Influenza,inj,quad, With Preservative 07/25/2015   MMR 11/03/2003   PFIZER(Purple Top)SARS-COV-2 Vaccination 10/14/2019, 11/04/2019, 08/03/2020   Tdap 03/10/2008, 05/28/2016   Zoster, Live 10/22/2018, 03/18/2019    No family history on file.   Current Outpatient Medications:    albuterol (VENTOLIN HFA) 108 (90 Base) MCG/ACT inhaler, Inhale 1 puff into the lungs every 4 (four) hours as needed., Disp: 18 g, Rfl: 5   aspirin EC 81 MG tablet, Take 81 mg by mouth daily., Disp: , Rfl:    atorvastatin (LIPITOR) 10 MG tablet, Take 1 tablet (10 mg total) by mouth at bedtime., Disp: 90 tablet, Rfl: 3   Calcium Carb-Cholecalciferol (CALCIUM+D3 PO), Take 1 tablet by mouth at bedtime., Disp: , Rfl:    Cholecalciferol (VITAMIN D3 PO), Take 1 tablet by mouth 2 (two) times a week., Disp: , Rfl:    Cyanocobalamin (VITAMIN B-12 PO), Take 1 tablet by mouth at bedtime., Disp: , Rfl:    escitalopram (LEXAPRO) 10 MG tablet, TAKE 1 TABLET BY MOUTH ONCE DAILY, Disp: 90 tablet, Rfl: 5   fluticasone (FLOVENT HFA) 110 MCG/ACT inhaler, Inhale 2 puffs into the lungs 2 (two) times daily., Disp: 12 g, Rfl: 5   ibuprofen (ADVIL,MOTRIN) 800 MG tablet, Take 1 tablet (800 mg total) by mouth every 8 (eight) hours as needed (mild pain)., Disp: 30 tablet, Rfl: 0   levothyroxine (SYNTHROID) 112 MCG tablet, Take 1 tablet (112 mcg total) by mouth daily in the morning on an empty stomach., Disp: 90 tablet, Rfl: 4   losartan (COZAAR) 25 MG tablet,  Take 25 mg by mouth daily., Disp: , Rfl:    losartan-hydrochlorothiazide (HYZAAR) 100-12.5 MG tablet, TAKE 1 TABLET BY MOUTH ONCE A DAY, Disp: 90 tablet, Rfl: 3   losartan-hydrochlorothiazide (HYZAAR) 100-12.5 MG tablet, TAKE 1 TABLET BY MOUTH ONCE A DAY, Disp: 90 tablet, Rfl: 3   meclizine (ANTIVERT) 25 MG tablet, Take 1 tablet (25 mg total) by mouth 3 (three) times daily as needed for dizziness., Disp: 15 tablet, Rfl: 0   metFORMIN (GLUCOPHAGE-XR) 500 MG 24 hr tablet, Take 1,000 mg by mouth 2 (two) times daily., Disp: , Rfl: 3   metFORMIN (GLUCOPHAGE-XR) 500 MG 24 hr tablet, TAKE 4 TABLETS BY MOUTH ONCE A DAY, Disp: 360 tablet, Rfl: 4   benzonatate (TESSALON  PERLES) 100 MG capsule, Take 1 capsule (100 mg total) by mouth 3 (three) times daily as needed. (Patient not taking: Reported on 04/07/2021), Disp: 20 capsule, Rfl: 0   benzonatate (TESSALON) 100 MG capsule, Take 1-2 capsules (100-200 mg total) by mouth at bedtime as needed for cough. (Patient not taking: Reported on 04/07/2021), Disp: 20 capsule, Rfl: 0   fluticasone (FLONASE) 50 MCG/ACT nasal spray, Place 2 sprays into both nostrils daily. (Patient not taking: Reported on 04/07/2021), Disp: 16 g, Rfl: 6   levothyroxine (SYNTHROID) 112 MCG tablet, TAKE 1 TABLET BY MOUTH ON AN EMPTY STOMACH DAILY IN THE MORNING, Disp: 90 tablet, Rfl: 4   traMADol (ULTRAM) 50 MG tablet, Take 1 tablet (50 mg total) by mouth every 6 (six) hours as needed for moderate pain. (Patient not taking: Reported on 04/07/2021), Disp: 20 tablet, Rfl: 1      Objective:   Vitals:   04/07/21 0934  BP: 130/84  Pulse: 74  SpO2: 96%  Weight: 263 lb 9.6 oz (119.6 kg)  Height: 5' 4.5" (1.638 m)    Estimated body mass index is 44.55 kg/m as calculated from the following:   Height as of this encounter: 5' 4.5" (1.638 m).   Weight as of this encounter: 263 lb 9.6 oz (119.6 kg).  @WEIGHTCHANGE @  [employer]   04/07/21 0934  Weight: 263 lb 9.6 oz (119.6 kg)      Physical Exam  General Appearance:    Alert, cooperative, no distress, appears stated age - yes , Deconditioned looking - no , OBESE  - no, Sitting on Wheelchair -  no  Head:    Normocephalic, without obvious abnormality, atraumatic  Eyes:    PERRL, conjunctiva/corneas clear,  Ears:    Normal TM's and external ear canals, both ears  Nose:   Nares normal, septum midline, mucosa normal, no drainage    or sinus tenderness. OXYGEN ON  - no . Patient is @ ra   Throat:   Lips, mucosa, and tongue normal; teeth and gums normal. Cyanosis on lips - no  Neck:   Supple, symmetrical, trachea midline, no adenopathy;    thyroid:  no enlargement/tenderness/nodules; no carotid   bruit or JVD  Back:     Symmetric, no curvature, ROM normal, no CVA tenderness  Lungs:     Distress - no , Wheeze no, Barrell Chest - no, Purse lip breathing - no, Crackles - no   Chest Wall:    No tenderness or deformity.    Heart:    Regular rate and rhythm, S1 and S2 normal, no rub   or gallop, Murmur - no  Breast Exam:    NOT DONE  Abdomen:     Soft, non-tender, bowel sounds active all four quadrants,    no masses, no organomegaly. Visceral obesity - yes  Genitalia:   NOT DONE  Rectal:   NOT DONE  Extremities:   Extremities - normal, Has Cane - no, Clubbing - no, Edema - no  Pulses:   2+ and symmetric all extremities  Skin:   Stigmata of Connective Tissue Disease - no  Lymph nodes:   Cervical, supraclavicular, and axillary nodes normal  Psychiatric:  Neurologic:   Pleasant - yes, Anxious - no, Flat affect - no  CAm-ICU - neg, Alert and Oriented x 3 - yes, Moves all 4s - yes, Speech - normal, Cognition - intact        Assessment:       ICD-10-CM  1. Cough  R05.9 Nitric oxide    2. Chronic cough  R05.3     3. Irritable larynx syndrome  J38.7          Plan:     Patient Instructions     ICD-10-CM   1. Cough  R05.9 Nitric oxide    2. Chronic cough  R05.3     3. Irritable larynx syndrome  J38.7        Cough is called yclical cough/LPR cough or cough neuropathy or irritable larynx syndrome Unclear if losartan is playing a role but possibly so Retest probability for asthma at this point is low but still not fully ruled out Acid reflux could be playing a role based on inflamed vocal cords seen by ENT per your report  Plan  -Check CBC with differential and blood IgE -Check high-resolution CT scan of the chest supine and prone -Stop losartan and instead start bisoprolol 5 mg once daily  -Continue to monitor your blood pressure at home frequently -Stop Flovent but  cntinue other medications including Flonase as per ENT advise  -If you do not have asthma Flovent could be irritating to the vocal cords -Refill voice rehab Garald Balding -Other nonpharmaceutical interventions for cough include  -Take 2-3 days of voice rest without any talking or whispering  -Drink cold water when he had a urged to cough  -Swallow saliva when he had a urged to cough  -Talk less frequently as possible  -Suck on sugarless lozenges when he have urged to cough   Follow-up - Return to see nurse practitioner in 4 to 6 weeks to evaluate blood pressure control and also cough improvement. -Based on follow-up consider gabapentin if needed    SIGNATURE    Dr. Brand Males, M.D., F.C.C.P,  Pulmonary and Critical Care Medicine Staff Physician, [place] Director - Interstitial Lung Disease  Program  Pulmonary [place] [facility] [place], [place], [address]  Pager: [phone], If no answer or between  15:00h - 7:00h: call [phone] Telephone: [phone]  10:23 AM 04/07/2021

## 2021-04-10 ENCOUNTER — Other Ambulatory Visit (HOSPITAL_COMMUNITY): Payer: Self-pay

## 2021-04-10 LAB — IGE: IgE (Immunoglobulin E), Serum: 142 kU/L — ABNORMAL HIGH (ref <=114)

## 2021-04-10 MED FILL — Metformin HCl Tab ER 24HR 500 MG: ORAL | 90 days supply | Qty: 360 | Fill #0 | Status: AC

## 2021-04-19 ENCOUNTER — Other Ambulatory Visit (HOSPITAL_COMMUNITY): Payer: Self-pay

## 2021-04-19 MED ORDER — LEVOTHYROXINE SODIUM 112 MCG PO TABS
112.0000 ug | ORAL_TABLET | Freq: Every morning | ORAL | 3 refills | Status: DC
  Filled 2021-04-19: qty 90, 90d supply, fill #0

## 2021-04-24 ENCOUNTER — Ambulatory Visit: Payer: 59 | Attending: Internal Medicine | Admitting: Speech Pathology

## 2021-04-24 ENCOUNTER — Other Ambulatory Visit: Payer: Self-pay

## 2021-04-24 ENCOUNTER — Telehealth: Payer: Self-pay | Admitting: Internal Medicine

## 2021-04-24 ENCOUNTER — Other Ambulatory Visit (HOSPITAL_COMMUNITY): Payer: Self-pay

## 2021-04-24 DIAGNOSIS — R498 Other voice and resonance disorders: Secondary | ICD-10-CM | POA: Diagnosis not present

## 2021-04-24 MED ORDER — LOSARTAN POTASSIUM-HCTZ 100-12.5 MG PO TABS
1.0000 | ORAL_TABLET | Freq: Every day | ORAL | 3 refills | Status: DC
  Filled 2021-04-24: qty 90, 90d supply, fill #0
  Filled 2021-07-24: qty 90, 90d supply, fill #1
  Filled 2021-10-10: qty 90, 90d supply, fill #2
  Filled 2022-01-21: qty 90, 90d supply, fill #3

## 2021-04-24 NOTE — Telephone Encounter (Signed)
Can resume old medication and needs to follow up with PCP regarding b/p management .  May need higher dose , will leave to PCP regarding changes   Please contact office for sooner follow up if symptoms do not improve or worsen or seek emergency care

## 2021-04-24 NOTE — Telephone Encounter (Signed)
Call made to patient, confirmed DOB. She states this weekend her BP was 150/90. She states she has been taking the bisoprolol for about a week however over the weekend  her BP ran high this weekend. Her most recent BP  this morning was 140/90. Her normal is 120 over 70 or 80. She went back to her Losartan/HCTZ and has since stopped the Bisoprolol. Requesting recommendations. She denies any acute symptoms, no headache or dizziness.   TP please advise. Thanks :)

## 2021-04-24 NOTE — Therapy (Signed)
[facility] [address] [place], [place], [address] Phone: [phone]   Fax:  [phone]  Speech Language Pathology Treatment  Patient Details  Name: Katelyn [name] MRN: [id number] Date of Birth: June 17, 1956 Referring Provider (SLP): Dr. Brand Males   Encounter Date: 04/24/2021   End of Session - 04/24/21 1426     Visit Number 1    Number of Visits 17    Date for SLP Re-Evaluation 06/23/21    SLP Start Time 1232    SLP Stop Time  1316    SLP Time Calculation (min) 44 min    Activity Tolerance Patient tolerated treatment well             Past Medical History:  Diagnosis Date   Cancer ([place])    thyroid cancer   Hypertension    Hypothyroidism    PONV (postoperative nausea and vomiting)    Pre-diabetes    S/P laparoscopic assisted vaginal hysterectomy (LAVH) 02/26/2018   Seasonal allergies    SVD (spontaneous vaginal delivery)    x 2    Past Surgical History:  Procedure Laterality Date   ANTERIOR AND POSTERIOR REPAIR N/A 02/26/2018   Procedure: ANTERIOR (CYSTOCELE) AND POSTERIOR REPAIR (RECTOCELE);  Surgeon: Janyth Contes, MD;  Location: [facility];  Service: Gynecology;  Laterality: N/A;   COLONOSCOPY     LAPAROSCOPIC VAGINAL HYSTERECTOMY WITH SALPINGECTOMY Bilateral 02/26/2018   Procedure: LAPAROSCOPIC ASSISTED VAGINAL HYSTERECTOMY WITH SALPINGECTOMY;  Surgeon: Janyth Contes, MD;  Location: [facility];  Service: Gynecology;  Laterality: Bilateral;   TONSILLECTOMY     TOTAL THYROIDECTOMY     in [place] EXTRACTION      There were no vitals filed for this visit.   Subjective Assessment - 04/24/21 1248     Subjective "My voice has changed since I was intubated"    Patient is accompained by: Family member               SLP Evaluation [facility] - 04/24/21 1419       SLP Visit Information   SLP Received On 04/24/21    Referring Provider (SLP) Dr. 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 Improve my voice      General Information   HPI For the last 1 year she has had insidious onset of chronic cough the cough is the same and feels similar to lisinopril cough.  She is wondering if losartan can be causing the cough.  She says the cough is significant.  Symptom severity is below.  Stable since onset.  Cough is mostly in the daytime but sometimes wakes her up at night.  She has laryngeal quality to the cough.  Made worse by talking or laughing.  Made better by staying quiet and drinking cold water.  She not able to talk for a long time and zoom meetings.  This makes her cough and want to reach out to water.  Her voice gets hoarse.  Its a dry cough.  There is no wheezing or shortness of breath.       Thyroidectomy 7 years ago.ENT consult reveaqled post glottic edema likely c/w LPR, vocal folds WNL no mass or nodule    Mobility Status walks independently      Balance Screen  Has the patient fallen in the past 6 months No    Has the patient had a decrease in activity level because of a fear of falling?  No    Is the patient reluctant to leave their home because of a fear of falling?  No      Prior Functional Status   Cognitive/Linguistic Baseline Within functional limits     Lives With Alone    Available Support Family    Vocation Full time employment      Cognition   Overall Cognitive Status Within Functional Limits for tasks assessed      Oral Motor/Sensory Function   Overall Oral Motor/Sensory Function Appears within functional limits for tasks assessed      Motor Speech   Phonation Hoarse    Resonance Within functional limits    Phonation Impaired    Vocal Abuses Habitual Cough/Throat Clear;Prolonged Vocal Use    Tension Present Neck    Volume Soft;Appropriate    Pitch Low                   SLP Education - 04/24/21 1425     Education Details eliminate throat clears, throat clear alternatives, abdominal breathing              SLP Short Term Goals - 04/24/21 1442       SLP SHORT TERM GOAL #1   Title Pt will demonstrate abominal breathing 18/20 sentences with rare min A    Time 4    Period Weeks    Status New      SLP SHORT TERM GOAL #2   Title Pt will report carryover of relaxation strategies outside of therapy 4x a week    Time 4    Period Weeks    Status New      SLP SHORT TERM GOAL #3   Title Pt will report decrerase in chronic cough/throat clearing by 25% subjectively    Time 4    Period Weeks    Status New      SLP SHORT TERM GOAL #4   Title Pt will complete HEP for voice and vocal warm ups with rare min A    Time 4    Period Weeks    Status New      SLP SHORT TERM GOAL #5   Title Pt will carryover flow phonation 18/20 sentence with rare min A    Time 4    Period Weeks    Status New              SLP Long Term Goals - 04/24/21 1446       SLP LONG TERM GOAL #1   Title Pt will converse for 15 minutes with abdominal breathing    Time 8    Period Weeks    Status New      SLP LONG TERM GOAL #2   Title Pt will report 50% reduction of chronic cough/throat clear over 2 weeks subjectively    Time 8    Period Weeks    Status New      SLP LONG TERM GOAL #3   Title Pt will carryover 3 strategies to report 50% improvement in vocal quality and 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 - 04/24/21 1426     Clinical Impression Statement Bethena Roys Quinto is referred  for outpt ST by pulomonolgy due to chronic cough and hypersensitive larynx. She reports some voice changes s/p intubation for surgery several years ago. ENT noted post glottic edema. Today she presents with mild hoarseness and intermittent coughing and throat clearing. Oretha has not started PPI. She noted a severe problem clearing her throat on the RSI (Reflux Symptom Index) which she has independently began to try to reduce. Deneise identifies perfum, temperature, food smells, spicy and acidic foods and speaking as triggers for cough/throat clear. She scored a 24 on the Cough Severity Index indicating "always" people ask if she is sick due to cough and "almost always" to people asking "what's wrong, coughing affecting her voice and that her coughing problem upsets her. During the evaluation, she was noted to clear her throat 3x and had 1 coughing episode. Kimberly also feels her voice quality has changed, describing her voice as "rough" with difficulty reaching "higher tones." Prior to this, Nelani enjoyed singing, however she has not felt she is able to maintain high notes. 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 Cough and voice changes have affected her ability to project and  teach professional classes and after a presentation, she feels her throat is strained and sore.   Perceptual Voice Evaluation     Characteristic voice use: uses voice approximately 6 hours per day for job, and does larger classes/presentation   Environmental risks: cough triggered by smell, temperature, spicy/acid foods    Misuse: clavicular breathing, speaks on residual capactiy   Phonotraumatic behaviors:  frequent throat clearing, hyperphonia, chronic cough   Vocal characteristics:  hoarse, , rough, strained, breathy,  reduced ability to project, vocal fatigue  Objective Voice Measurements   Maximum phonation time for sustained "ah": 15   Average fundamental frequency during sustained "ah": 311.1  Hz +WNL for gender)   Oral  reading lowest average 92.5 Hz - of note, oral reading pitch was much lower than sustained /a/ pitch. Glottal fry noted throughout reading task  Pitch glide highest:523.3 Hz; Lowest 146.8     Habitual pitch: 196 Glottal Function Index 12 (Norm <4) Reflux Symptom Index 24 (norm 10-13) Cough Severity Index 24 Initiated training on abdominal breathing, education re: chronic cough/VCD and LPR as well as vocal tension recommend skilled ST to reduce sx of chronic cough and maximize efficiency and quality of voice for success at work   Speech Therapy Frequency 2x / week    Duration 8 weeks    Treatment/Interventions SLP instruction and feedback;Compensatory strategies;Functional tasks;Compensatory techniques;Internal/external aids;Patient/family education    Potential to Achieve Goals Good             Patient will benefit from skilled therapeutic intervention in order to improve the following deficits and impairments:   Other voice and resonance disorders    Problem List Patient Active Problem List   Diagnosis Date Noted   S/P laparoscopic assisted vaginal hysterectomy (LAVH) 02/26/2018   Cystocele and rectocele with incomplete uterovaginal prolapse 02/26/2018  Emylia Latella, Annye Rusk MS, University 04/24/2021, 2:50 PM  [facility] [address] [place], [place], [address] Phone: [phone]   Fax:  [phone]   Name: MISTI TOWLE MRN: [id number] Date of Birth: [birth year]

## 2021-04-24 NOTE — Patient Instructions (Signed)
   Great job eliminating throat clears - instead of clearing your throat, do a hard swallow, sip water, then try some gentle relaxed sniff and blows  Temperatures, smells, cleaners, stress,dust,  exercise, can trigger a hypersensitive larynx  Practice abdominal sniff blow in the mirror trying to move abdomen and and keep chest relatively still

## 2021-04-24 NOTE — Telephone Encounter (Signed)
Called and spoke with patient to let her know of message from [facility]. Patient stated that she needs someone to send in prescription but that her PCP would not till she has an appointment. Advised she would need to call them and make them aware of what's going on and since they prescribed it they would need to send it in. Patient said "I'll just have high blood pressure" and hung up the phone. Nothing further needed at this time.

## 2021-04-26 ENCOUNTER — Encounter: Payer: 59 | Admitting: Speech Pathology

## 2021-05-01 ENCOUNTER — Ambulatory Visit (INDEPENDENT_AMBULATORY_CARE_PROVIDER_SITE_OTHER): Payer: 59 | Admitting: Otolaryngology

## 2021-05-03 ENCOUNTER — Other Ambulatory Visit: Payer: Self-pay

## 2021-05-03 ENCOUNTER — Ambulatory Visit (INDEPENDENT_AMBULATORY_CARE_PROVIDER_SITE_OTHER)
Admission: RE | Admit: 2021-05-03 | Discharge: 2021-05-03 | Disposition: A | Discharge status: Home / Self Care | Payer: 59 | Source: Ambulatory Visit | Attending: Internal Medicine | Admitting: Internal Medicine

## 2021-05-03 DIAGNOSIS — R059 Cough, unspecified: Secondary | ICD-10-CM

## 2021-05-03 DIAGNOSIS — R053 Chronic cough: Secondary | ICD-10-CM | POA: Diagnosis not present

## 2021-05-03 DIAGNOSIS — R911 Solitary pulmonary nodule: Secondary | ICD-10-CM | POA: Diagnosis not present

## 2021-05-03 DIAGNOSIS — R918 Other nonspecific abnormal finding of lung field: Secondary | ICD-10-CM | POA: Diagnosis not present

## 2021-05-03 DIAGNOSIS — I700 Atherosclerosis of aorta: Secondary | ICD-10-CM | POA: Diagnosis not present

## 2021-05-08 ENCOUNTER — Other Ambulatory Visit: Payer: Self-pay

## 2021-05-08 ENCOUNTER — Other Ambulatory Visit (HOSPITAL_COMMUNITY): Payer: Self-pay

## 2021-05-08 ENCOUNTER — Telehealth: Payer: Self-pay | Admitting: Primary Care

## 2021-05-08 ENCOUNTER — Encounter: Payer: Self-pay | Admitting: Primary Care

## 2021-05-08 ENCOUNTER — Ambulatory Visit: Payer: 59

## 2021-05-08 ENCOUNTER — Ambulatory Visit (INDEPENDENT_AMBULATORY_CARE_PROVIDER_SITE_OTHER): Payer: 59 | Admitting: Primary Care

## 2021-05-08 VITALS — BP 142/88 | HR 90 | Ht 64.5 in | Wt 265.0 lb

## 2021-05-08 DIAGNOSIS — R059 Cough, unspecified: Secondary | ICD-10-CM

## 2021-05-08 DIAGNOSIS — I272 Pulmonary hypertension, unspecified: Secondary | ICD-10-CM | POA: Diagnosis not present

## 2021-05-08 DIAGNOSIS — R918 Other nonspecific abnormal finding of lung field: Secondary | ICD-10-CM | POA: Diagnosis not present

## 2021-05-08 DIAGNOSIS — R911 Solitary pulmonary nodule: Secondary | ICD-10-CM | POA: Diagnosis not present

## 2021-05-08 DIAGNOSIS — R053 Chronic cough: Secondary | ICD-10-CM

## 2021-05-08 MED ORDER — MONTELUKAST SODIUM 10 MG PO TABS
10.0000 mg | ORAL_TABLET | Freq: Every day | ORAL | 5 refills | Status: DC
  Filled 2021-05-08: qty 30, 30d supply, fill #0
  Filled 2021-05-09: qty 90, 90d supply, fill #0
  Filled 2021-08-26: qty 90, 90d supply, fill #1

## 2021-05-08 MED ORDER — OMEPRAZOLE 20 MG PO CPDR
20.0000 mg | DELAYED_RELEASE_CAPSULE | Freq: Every day | ORAL | 3 refills | Status: DC
  Filled 2021-05-08: qty 90, 90d supply, fill #0
  Filled 2021-09-09: qty 90, 90d supply, fill #1
  Filled 2021-12-20: qty 90, 90d supply, fill #2
  Filled 2022-03-23: qty 90, 90d supply, fill #3

## 2021-05-08 NOTE — Progress Notes (Signed)
@Patient  ID: Katelyn [name], female    DOB: 14-Jun-1956, 65 y.o.   MRN: [id number]  Chief Complaint  Patient presents with   Cough    Recent CT scan    Referring provider: Orpah Melter, MD  HPI: 65 year old female, never smoked.  Past medical history significant for laparoscopic vaginal hysterectomy.  Patient of Dr. Chase Caller, seen for initial consult on 04/07/2021 for cough.  Previous [facility] pulmonary encounter: HPI Katelyn [name] 64 y.o. -presents for evaluation of chronic cough.  I took care of her of her husband 20th-3 years ago in the ICU and he passed away.  Shortly after that her mother also passed away.  She had prolonged grief reaction.  She just came off Lexapro.  She finds fulfillment with the help of her children and her grandchildren.  She has 4-5 grandchildren at this point.  She works as a Dealer at [employer].  She tells me that a few years ago she was on lisinopril and had chronic cough and the cough resolved when she stopped lisinopril.  After that she went on losartan.  She is currently on losartan.  For the last 1 year she has had insidious onset of chronic cough the cough is the same and feels similar to lisinopril cough.  She is wondering if losartan can be causing the cough.  She says the cough is significant.  Symptom severity is below.  Stable since onset.  Cough is mostly in the daytime but sometimes wakes her up at night.  She has laryngeal quality to the cough.  Made worse by talking or laughing.  Made better by staying quiet and drinking cold water.  She not able to talk for a long time and zoom meetings.  This makes her cough and want to reach out to water.  Her voice gets hoarse.  Its a dry cough.  There is no wheezing or shortness of breath.    Cough related issue  - she has seen ENT and apparently vocal cords are red.  Unclear if she has acid reflux.  - Of note few to several years ago she had thyroidectomy without any problems.  A few years ago she  had intubation for the procedure and after that she has not been able to sing  -Unclear if she has acid reflux  -She reports no asthma but has seasonal allergies.  Previous blood eosinophils normal.  Today nitric oxide 9 ppb in and normal  -She does have hypertension on losartan.  Previously had cough with lisinopril.   Dr Lorenza Cambridge Reflux Symptom Index (> 13-15 suggestive of LPR cough) 0 -> 5  =  none ->severe problem  Hoarseness of problem with voice 3  Clearing  Of Throat 4  Excess throat mucus or feeling of post nasal drip 3  Difficulty swallowing food, liquid or tablets 0  Cough after eating or lying down 5  Breathing difficulties or choking episodes 0  Troublesome or annoying cough 5  Sensation of something sticking in throat or lump in throat 5  Heartburn, chest pain, indigestion, or stomach acid coming up 1  TOTAL 26   05/08/2021- Interim hx  Patient presents today for 1 month follow-up/cough. Dr. Chase Caller felt she had cough neuropathy or irritable larynx syndrome.  He advised her to stop Losartan and start bisoprolol 27m daily. Flovent hfa was discontinued. She was ordered for labs and HRCT. She was referred to voice rehab. Consider gabapentin in needed.   Cough is better, she  has been cutting back on her caffeine. She is taking omeprazole 76m over the counter. She gets bronchospasms at work which causes her to cough. She is a new grandmother so she has been at home the last several weeks. She feels back to normal. Cough has resolved. She is off Flovent and has not needed to use albuterol rescue inhaler in over two weeks. Her boss has put in an air purifier at work. She had pneumonia 30 years ago, she was not hospitalized but was out of work for 1 month. She has two dogs at home.   Imaging:  05/03/21 HRCT- mild patchy subpleural reticulation and ground glass opacity in both lungs. No traction bronchiectasis or frank honeycombing. Findings could be due to nonspecific  postinfectious/postinflammatory scarring with nonspecific interstitial pneumonia (NSIP) or early usual interstitial pneumonia not excluded. Solid 448mleft lower lobe pulmonary nodules. Dilated main pulmonary artery suggesting pulmonary arterial hypertension. Aortic atherosclerosis    Allergies  Allergen Reactions   Lisinopril Cough   Sulfa Antibiotics Rash    Immunization History  Administered Date(s) Administered   Hepatitis B, adult 04/22/1985, 05/19/1985, 10/22/1985   Influenza Whole 07/16/2011, 07/23/2012, 08/03/2013, 07/06/2014   Influenza,inj,quad, With Preservative 07/25/2015   MMR 11/03/2003   PFIZER(Purple Top)SARS-COV-2 Vaccination 10/14/2019, 11/04/2019, 08/03/2020   Tdap 03/10/2008, 05/28/2016   Zoster, Live 10/22/2018, 03/18/2019    Past Medical History:  Diagnosis Date   [place] [place]   thyroid cancer   Hypertension    Hypothyroidism    PONV (postoperative nausea and vomiting)    Pre-diabetes    S/P laparoscopic assisted vaginal hysterectomy (LAVH) 02/26/2018   Seasonal allergies    SVD (spontaneous vaginal delivery)    x 2    Tobacco History: Social History   Tobacco Use  Smoking Status Never  Smokeless Tobacco Never   Counseling given: Not Answered   Outpatient Medications Prior to Visit  Medication Sig Dispense Refill   albuterol (VENTOLIN HFA) 108 (90 Base) MCG/ACT inhaler Inhale 1 puff into the lungs every 4 (four) hours as needed. 18 g 5   aspirin EC 81 MG tablet Take 81 mg by mouth daily. (Patient not taking: No sig reported)     atorvastatin (LIPITOR) 10 MG tablet Take 1 tablet (10 mg total) by mouth at bedtime. 90 tablet 3   benzonatate (TESSALON PERLES) 100 MG capsule Take 1 capsule (100 mg total) by mouth 3 (three) times daily as needed. (Patient not taking: No sig reported) 20 capsule 0   benzonatate (TESSALON) 100 MG capsule Take 1-2 capsules (100-200 mg total) by mouth at bedtime as needed for cough. (Patient not taking: No sig reported) 20  capsule 0   bisoprolol (ZEBETA) 5 MG tablet Take 1 tablet (5 mg total) by mouth daily. 30 tablet 3   Calcium Carb-Cholecalciferol (CALCIUM+D3 PO) Take 1 tablet by mouth at bedtime.     Cholecalciferol (VITAMIN D3 PO) Take 1 tablet by mouth 2 (two) times a week. (Patient not taking: No sig reported)     Cyanocobalamin (VITAMIN B-12 PO) Take 1 tablet by mouth at bedtime.     escitalopram (LEXAPRO) 10 MG tablet TAKE 1 TABLET BY MOUTH ONCE DAILY 90 tablet 5   fluticasone (FLONASE) 50 MCG/ACT nasal spray Place 2 sprays into both nostrils daily. (Patient not taking: No sig reported) 16 g 6   fluticasone (FLOVENT HFA) 110 MCG/ACT inhaler Inhale 2 puffs into the lungs 2 (two) times daily. (Patient not taking: No sig reported) 12 g 5  ibuprofen (ADVIL,MOTRIN) 800 MG tablet Take 1 tablet (800 mg total) by mouth every 8 (eight) hours as needed (mild pain). (Patient not taking: No sig reported) 30 tablet 0   levothyroxine (SYNTHROID) 112 MCG tablet TAKE 1 TABLET BY MOUTH ON AN EMPTY STOMACH DAILY IN THE MORNING 90 tablet 4   levothyroxine (SYNTHROID) 112 MCG tablet Take 1 tablet (112 mcg total) by mouth daily in the morning on an empty stomach. (Patient not taking: No sig reported) 90 tablet 4   levothyroxine (SYNTHROID) 112 MCG tablet Take 1 tablet (112 mcg total) by mouth in the morning on an empty stomach once daily 90 tablet 3   losartan-hydrochlorothiazide (HYZAAR) 100-12.5 MG tablet TAKE 1 TABLET BY MOUTH ONCE A DAY 90 tablet 3   losartan-hydrochlorothiazide (HYZAAR) 100-12.5 MG tablet Take 1 tablet by mouth daily. 90 tablet 3   meclizine (ANTIVERT) 25 MG tablet Take 1 tablet (25 mg total) by mouth 3 (three) times daily as needed for dizziness. (Patient not taking: No sig reported) 15 tablet 0   metFORMIN (GLUCOPHAGE-XR) 500 MG 24 hr tablet Take 1,000 mg by mouth 2 (two) times daily. (Patient not taking: No sig reported)  3   metFORMIN (GLUCOPHAGE-XR) 500 MG 24 hr tablet TAKE 4 TABLETS BY MOUTH ONCE A DAY  (Patient not taking: No sig reported) 360 tablet 4   traMADol (ULTRAM) 50 MG tablet Take 1 tablet (50 mg total) by mouth every 6 (six) hours as needed for moderate pain. (Patient not taking: No sig reported) 20 tablet 1   No facility-administered medications prior to visit.    Review of Systems  Review of Systems  Constitutional: Negative.   HENT: Negative.    Respiratory:  Negative for cough, chest tightness, shortness of breath and wheezing.   Cardiovascular: Negative.     Physical Exam  BP (!) 142/88   Pulse 90   Ht 5' 4.5" (1.638 m)   Wt 265 lb (120.2 kg)   LMP  (LMP Unknown)   SpO2 97%   BMI 44.78 kg/m  Physical Exam Constitutional:      Appearance: Normal appearance.  HENT:     Head: Normocephalic and atraumatic.     Mouth/Throat:     Comments: Deferred d/t masking Cardiovascular:     Rate and Rhythm: Normal rate and regular rhythm.  Pulmonary:     Effort: Pulmonary effort is normal.     Breath sounds: Normal breath sounds.  Skin:    General: Skin is warm and dry.  Neurological:     General: No focal deficit present.     Mental Status: She is alert and oriented to person, place, and time. Mental status is at baseline.  Psychiatric:        Mood and Affect: Mood normal.        Behavior: Behavior normal.        Thought Content: Thought content normal.        Judgment: Judgment normal.     Lab Results:  CBC    Component Value Date/Time   WBC 6.8 04/07/2021 1031   RBC 4.33 04/07/2021 1031   HGB 12.4 04/07/2021 1031   HCT 36.9 04/07/2021 1031   PLT 302.0 04/07/2021 1031   MCV 85.3 04/07/2021 1031   MCH 28.0 10/29/2019 1005   MCHC 33.4 04/07/2021 1031   RDW 14.4 04/07/2021 1031   LYMPHSABS 1.8 04/07/2021 1031   MONOABS 0.5 04/07/2021 1031   EOSABS 0.3 04/07/2021 1031   BASOSABS 0.0 04/07/2021 1031  BMET    Component Value Date/Time   NA 139 10/29/2019 1005   K 4.3 10/29/2019 1005   CL 104 10/29/2019 1005   CO2 26 10/29/2019 1005   GLUCOSE  122 (H) 10/29/2019 1005   BUN 9 10/29/2019 1005   CREATININE 0.68 10/29/2019 1005   CALCIUM 8.7 (L) 10/29/2019 1005   GFRNONAA >60 10/29/2019 1005   GFRAA >60 10/29/2019 1005    BNP No results found for: BNP  ProBNP No results found for: PROBNP  Imaging: CT Chest High Resolution  Result Date: 05/04/2021 CLINICAL DATA:  Chronic cough for 1 year. EXAM: CT CHEST WITHOUT CONTRAST TECHNIQUE: Multidetector CT imaging of the chest was performed following the standard protocol without intravenous contrast. High resolution imaging of the lungs, as well as inspiratory and expiratory imaging, was performed. COMPARISON:  None. FINDINGS: Cardiovascular: Normal heart size. No significant pericardial effusion/thickening. Left anterior descending and right coronary atherosclerosis. Atherosclerotic nonaneurysmal thoracic aorta. Dilated main pulmonary artery (3.6 cm diameter). Mediastinum/Nodes: Total thyroidectomy. Unremarkable esophagus. No pathologically enlarged axillary, mediastinal or hilar lymph nodes, noting limited sensitivity for the detection of hilar adenopathy on this noncontrast study. Lungs/Pleura: No pneumothorax. No pleural effusion. Solid 4 mm peripheral left lower lobe pulmonary nodule (series 5/image 189). No acute consolidative airspace disease, lung masses or additional significant pulmonary nodules. No significant lobular air trapping or evidence of tracheobronchomalacia on the expiration sequence. Mild patchy subpleural reticulation and ground-glass opacity in both lungs, asymmetrically prominent in the left lung, persistent on prone imaging. No clear apicobasilar gradient. No significant regions of traction bronchiectasis, architectural distortion or frank honeycombing. Upper abdomen: No acute abnormality. Musculoskeletal: No aggressive appearing focal osseous lesions. Moderate thoracic spondylosis. IMPRESSION: 1. Mild patchy subpleural reticulation and ground-glass opacity in both lungs,  asymmetrically prominent in the left lung. No traction bronchiectasis or frank honeycombing. Findings could be due to nonspecific postinfectious/postinflammatory scarring, with an interstitial lung disease such as nonspecific interstitial pneumonia (NSIP) or early usual interstitial pneumonia (UIP) not excluded. Consider follow-up high-resolution chest CT study in 12 months to assess temporal pattern stability, as clinically warranted. Findings are indeterminate for UIP per consensus guidelines: Diagnosis of Idiopathic Pulmonary Fibrosis: An Official ATS/ERS/JRS/ALAT Clinical Practice Guideline. [place], Iss 5, [phone], Jun 15 2017. 2. Solid 4 mm left lower lobe pulmonary nodule. No follow-up needed if patient is low-risk. Non-contrast chest CT can be considered in 12 months if patient is high-risk. This recommendation follows the consensus statement: Guidelines for Management of Incidental Pulmonary Nodules Detected on CT Images: From the Fleischner Society 2017; Radiology 2017; 284:228-243. 3. Dilated main pulmonary artery, suggesting pulmonary arterial hypertension. 4. Two-vessel coronary atherosclerosis. 5. Aortic Atherosclerosis (ICD10-I70.0). Electronically Signed   By: Ilona Sorrel M.D.   On: 05/04/2021 12:02     Assessment & Plan:   Chronic cough - Patient has had a chronic dry cough x 1 year. Cough improved/resolved on PPI and off ICS and ARB. No known hx covid, she had a bad pneumonia 30 years ago. Eos absolute 300, IgE 142.  - HRCT 05/03/21 showed mild patchy subpleural reticulation and ground glass opacity in both lungs. No traction bronchiectasis or frank honeycombing. Findings could be due to nonspecific postinfectious/postinflammatory scarring with nonspecific interstitial pneumonia (NSIP) or early usual interstitial pneumonia not excluded.  - Continue Omeprazole 11m once daily before first meal  - Start Singulair 152mat bedtime, allergic component could be  contributing to cough - Needs repeat HRCT in 12 months to  assess pattern stability  Pulmonary hypertension ([place]) - Question of pulmonary HTN on most recent CT chest imaging in July 2022, she  needs an echocardiogram to assess PA pressure. If elevated would consider getting home sleep study to rule out sleep apnea as cause   Left lower lobe pulmonary nodule - Solid 33m LLL pulmonary nodules. Patient is considered low risk, will follow-up on HRCT in 12 months    EMartyn Ehrich NP 05/08/2021

## 2021-05-08 NOTE — Assessment & Plan Note (Signed)
-   Solid 94m LLL pulmonary nodules. Patient is considered low risk, will follow-up on HRCT in 12 months

## 2021-05-08 NOTE — Assessment & Plan Note (Signed)
-   Question of pulmonary HTN on most recent CT chest imaging in July 2022, she  needs an echocardiogram to assess PA pressure. If elevated would consider getting home sleep study to rule out sleep apnea as cause

## 2021-05-08 NOTE — Telephone Encounter (Signed)
Patient of yours you saw for consult for cough. She had an HRCT 05/03/21 showed mild patchy subpleural reticulation and ground glass opacity in both lungs. No traction bronchiectasis or frank honeycombing. Findings could be due to nonspecific postinfectious/postinflammatory scarring with nonspecific interstitial pneumonia (NSIP) or early usual interstitial pneumonia not excluded. Solid 44m LLL pulmonary nodules. Dilated main pulmonary artery.   Her cough has resolved/improved after stopping Flovent and starting omeprazole. Eos absolute were 300 and IgE 142, I put her on Singulair '10mg'$  at bedtime. She never had covid-19 that she is aware of, she did have a bad pneumonia 30 years ago but was not hospitalized. I will repeat HRCT in 12 months. I also am checking echo d/t suggestion of pulmonary HTN. Do you want any additional work up based on image findings?

## 2021-05-08 NOTE — Patient Instructions (Addendum)
CT chest showed post infectious/inflammatory scarring with nonspecific features, needs repeat high resolution CT chest in 12 months to assess pattern stability. You also had a small 36m left lower lobe pulmonary nodules, you are considered low risk d.t being a nonsmoker. Dilated main pulmonary artery suggesting pulmonary arterial hypertension   On your labs, eosinophils were 300 and IgE >100 (indicated possible allergic component contributing to cough)  Recommendations: - Start Singulair '10mg'$  at bedtime  - Continue Zyrtec or any other over the counter antihistamine of your preference  - Continue omeprazole '20mg'$  daily   Orders: - Echocardiogram re: pulmonary hypertension   Follow-up: - 3 months with Dr. RChase Caller    Montelukast Tablets What is this medication? MONTELUKAST (mon te LOO kast) prevents and treats the symptoms of asthma and allergies. It works by decreasing inflammation in the airways, making it easierto breathe. Do not use this medication to treat a sudden asthma attack. This medicine may be used for other purposes; ask your health care provider orpharmacist if you have questions. COMMON BRAND NAME(S): Singulair What should I tell my care team before I take this medication? They need to know if you have any of these conditions: Liver disease An unusual or allergic reaction to montelukast, other medications, foods, dyes, or preservatives Pregnant or trying to get pregnant Breast-feeding How should I use this medication? Take this medication by mouth with water. Take it as directed on the prescription label at the same time every day. You can take this medication with or without food. If it upsets your stomach, take it with food. Keep takingit unless your care team tells you to stop. A special MedGuide will be given to you by the pharmacist with eachprescription and refill. Be sure to read this information carefully each time. Talk to your care team about the use of this  medication in children. While this medication may be prescribed for children as young as 15 years for selectedconditions, precautions do apply. Overdosage: If you think you have taken too much of this medicine contact apoison control center or emergency room at once. NOTE: This medicine is only for you. Do not share this medicine with others. What if I miss a dose? If you miss a dose, skip it. Take your next dose at the normal time. Do nottake extra or 2 doses at the same time to make up for the missed dose. What may interact with this medication? Medications for seizures like phenytoin, phenobarbital, and carbamazepine Rifabutin Rifampin This list may not describe all possible interactions. Give your health care provider a list of all the medicines, herbs, non-prescription drugs, or dietary supplements you use. Also tell them if you smoke, drink alcohol, or use illegaldrugs. Some items may interact with your medicine. What should I watch for while using this medication? Visit your health care provider for regular checks on your progress. Tell your health care provider if your allergy or asthma symptoms do not improve. Takeyour medication even when you do not have symptoms. If you have asthma, talk to your health care provider about what to do in an acute asthma attack. Always have your rescue medication for asthma attacks withyou. Patients and their families should watch for new or worsening thoughts of suicide or depression. Also watch for sudden changes in feelings such as feeling anxious, agitated, panicky, irritable, hostile, aggressive, impulsive, severely restless, overly excited and hyperactive, or not being able to sleep. Any worsening of mood or thoughts of suicide or dying should be reported  toyour health care provider right away. What side effects may I notice from receiving this medication? Side effects that you should report to your care team as soon as possible: Allergic reactions-skin  rash, itching, hives, swelling of the face, lips, tongue, or throat Flu-like symptoms-fever, chills, muscle pain, cough, headache, fatigue Mood and behavior changes such as anxiety, nervousness, confusion, hallucinations, irritability, hostility, thoughts of suicide or self-harm, worsening mood, feelings of depression Pain, tingling, or numbness in the hands or feet Sinus pain or pressure around the face or forehead Trouble sleeping Vivid dreams or nightmares Side effects that usually do not require medical attention (report to your careteam if they continue or are bothersome): Cough Diarrhea Headache Runny or stuffy nose Sore throat Stomach pain This list may not describe all possible side effects. Call your doctor for medical advice about side effects. You may report side effects to FDA at1-800-FDA-1088. Where should I keep my medication? Keep out of the reach of children and pets. Store at room temperature between 15 and 30 degrees C (59 and 86 degrees F). Protect from light and moisture. Protect from light and moisture. Keep the container tightly closed. Get rid of any unused medication after the expirationdate. To get rid of medications that are no longer needed or expired: Take the medication to a medication take-back program. Check with your pharmacy or law enforcement to find a location. If you cannot return the medication, check the label or package insert to see if the medication should be thrown out in the garbage or flushed down the toilet. If you are not sure, ask your care team. If it is safe to put in the trash, empty the medication out of the container. Mix the medication with cat litter, dirt, coffee grounds, or other unwanted substance. Seal the mixture in a bag or container. Put it in the trash. NOTE: This sheet is a summary. It may not cover all possible information. If you have questions about this medicine, talk to your doctor, pharmacist, orhealth care provider.  2022  Elsevier/Gold Standard (2020-10-28 11:01:47)

## 2021-05-08 NOTE — Assessment & Plan Note (Addendum)
-   Patient has had a chronic dry cough x 1 year. Cough improved/resolved on PPI and off ICS and ARB. No known hx covid, she had a bad pneumonia 30 years ago. Eos absolute 300, IgE 142.  - HRCT 05/03/21 showed mild patchy subpleural reticulation and ground glass opacity in both lungs. No traction bronchiectasis or frank honeycombing. Findings could be due to nonspecific postinfectious/postinflammatory scarring with nonspecific interstitial pneumonia (NSIP) or early usual interstitial pneumonia not excluded.  - Continue Omeprazole '20mg'$  once daily before first meal  - Start Singulair '10mg'$  at bedtime, allergic component could be contributing to cough - Needs repeat HRCT in 12 months to assess pattern stability

## 2021-05-09 ENCOUNTER — Other Ambulatory Visit: Payer: 59

## 2021-05-09 ENCOUNTER — Other Ambulatory Visit (HOSPITAL_COMMUNITY): Payer: Self-pay

## 2021-05-09 DIAGNOSIS — R053 Chronic cough: Secondary | ICD-10-CM | POA: Diagnosis not present

## 2021-05-09 DIAGNOSIS — R918 Other nonspecific abnormal finding of lung field: Secondary | ICD-10-CM

## 2021-05-09 DIAGNOSIS — E890 Postprocedural hypothyroidism: Secondary | ICD-10-CM | POA: Diagnosis not present

## 2021-05-09 DIAGNOSIS — Z85850 Personal history of malignant neoplasm of thyroid: Secondary | ICD-10-CM | POA: Diagnosis not present

## 2021-05-09 DIAGNOSIS — Z7984 Long term (current) use of oral hypoglycemic drugs: Secondary | ICD-10-CM | POA: Diagnosis not present

## 2021-05-09 DIAGNOSIS — E785 Hyperlipidemia, unspecified: Secondary | ICD-10-CM | POA: Diagnosis not present

## 2021-05-09 DIAGNOSIS — R7303 Prediabetes: Secondary | ICD-10-CM | POA: Diagnosis not present

## 2021-05-09 MED ORDER — WEGOVY 0.25 MG/0.5ML ~~LOC~~ SOAJ: 0.2500 mg | SUBCUTANEOUS | 0 refills | Status: DC

## 2021-05-09 NOTE — Telephone Encounter (Signed)
Thanks for the good workup and question and taking care of her Katelyn [name] whil eI was away  Plna  - followup I -5 3 months or so to see response to singulair  - PFT at time of followup - ANA, Quant Gold, DS DNA, RF, CCP, ssa, ssb, scl-70, at time of follouowup - rest per your plan

## 2021-05-09 NOTE — Telephone Encounter (Signed)
Called and spoke with pt letting her know that after Beth spoke with MR, MR does want labwork to be done. Pt verbalized understanding. Orders have been placed. Nothing further needed.

## 2021-05-09 NOTE — Telephone Encounter (Signed)
Raquel Sarna can you please let patient know I spoke with MR and he does want some labs like I had mentioned. Please order above. Needs PFTs and FU in 3 months. Thanks

## 2021-05-10 ENCOUNTER — Other Ambulatory Visit (HOSPITAL_COMMUNITY): Payer: Self-pay

## 2021-05-10 ENCOUNTER — Other Ambulatory Visit: Payer: Self-pay

## 2021-05-10 ENCOUNTER — Ambulatory Visit: Payer: 59

## 2021-05-10 DIAGNOSIS — R498 Other voice and resonance disorders: Secondary | ICD-10-CM

## 2021-05-10 MED ORDER — SAXENDA 18 MG/3ML ~~LOC~~ SOPN
PEN_INJECTOR | SUBCUTANEOUS | 11 refills | Status: DC
  Filled 2021-05-10 – 2021-06-01 (×4): qty 15, 30d supply, fill #0

## 2021-05-10 NOTE — Therapy (Signed)
[facility] [address] [place], [place], [address] Phone: [phone]   Fax:  [phone]  Speech Language Pathology Treatment  Patient Details  Name: Katelyn [name] MRN: [id number] Date of Birth: [birth year] Referring Provider (SLP): Dr. Brand Males   Encounter Date: 05/10/2021   End of Session - 05/10/21 1550     Visit Number 2    Number of Visits 17    Date for SLP Re-Evaluation 06/23/21    SLP Start Time 8    SLP Stop Time  1445    SLP Time Calculation (min) 43 min    Activity Tolerance Patient tolerated treatment well             Past Medical History:  Diagnosis Date   Cancer ([place])    thyroid cancer   Hypertension    Hypothyroidism    PONV (postoperative nausea and vomiting)    Pre-diabetes    S/P laparoscopic assisted vaginal hysterectomy (LAVH) 02/26/2018   Seasonal allergies    SVD (spontaneous vaginal delivery)    x 2    Past Surgical History:  Procedure Laterality Date   ANTERIOR AND POSTERIOR REPAIR N/A 02/26/2018   Procedure: ANTERIOR (CYSTOCELE) AND POSTERIOR REPAIR (RECTOCELE);  Surgeon: Janyth Contes, MD;  Location: [facility];  Service: Gynecology;  Laterality: N/A;   COLONOSCOPY     LAPAROSCOPIC VAGINAL HYSTERECTOMY WITH SALPINGECTOMY Bilateral 02/26/2018   Procedure: LAPAROSCOPIC ASSISTED VAGINAL HYSTERECTOMY WITH SALPINGECTOMY;  Surgeon: Janyth Contes, MD;  Location: [facility];  Service: Gynecology;  Laterality: Bilateral;   TONSILLECTOMY     TOTAL THYROIDECTOMY     in [place] EXTRACTION      There were no vitals filed for this visit.   Subjective Assessment - 05/10/21 1542     Subjective "the cough is much better"    Currently in Pain? No/denies                   ADULT SLP TREATMENT - 05/10/21 1543       General Information   Behavior/Cognition Alert;Cooperative;Pleasant mood      Treatment Provided   Treatment provided  Cognitive-Linquistic      Cognitive-Linquistic Treatment   Treatment focused on Voice;Patient/family/caregiver education    Skilled Treatment Pt reports "cough is much better," with decreased caffine intake, use of air purifier at work, and limiting spicy foods. Pt has been taking Singulair the last two days. Cough has reportedly decreased from several times a day to no cough within the last few days. 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 Her voice feels "tired" daily and she has some intermittent hoarseness. Pt has been practicing sniff-blow at home. Further education and instruction completed for use of abdominal breathing. SOVT exercises introduced this session to warm-up/cool-down voice before and after work. Occasional min A required to produce voice via straw. Some pitch breaks exhibited for high pitch during pitch glides, which pt has also noted while singing. Pt plans to limit singing for awhile due to persistent laryngeal irritation. SLP provided handout re: SOVTE and additional throat clear alternatives to increase carryover at home.      Assessment / Recommendations / Plan   Plan Continue with current plan of care      Progression Toward Goals   Progression toward goals Progressing toward goals              SLP Education - 05/10/21  1550     Education Details throat clear alternatives, abdominal breathing, SOVT exericses    Person(s) Educated Patient    Methods Explanation;Demonstration;Handout    Comprehension Verbalized understanding;Returned demonstration;Need further instruction              SLP Short Term Goals - 05/10/21 1401       SLP SHORT TERM GOAL #1   Title Pt will demonstrate abominal breathing 18/20 sentences with rare min A    Time 4    Period Weeks    Status On-going      SLP SHORT TERM GOAL #2   Title Pt will report carryover of relaxation strategies outside of therapy 4x a week    Time 4     Period Weeks    Status On-going      SLP SHORT TERM GOAL #3   Title Pt will report decrerase in chronic cough/throat clearing by 25% subjectively    Time 4    Period Weeks    Status On-going      SLP SHORT TERM GOAL #4   Title Pt will complete HEP for voice and vocal warm ups with rare min A    Time 4    Period Weeks    Status On-going      SLP SHORT TERM GOAL #5   Title Pt will carryover flow phonation 18/20 sentence with rare min A    Time 4    Period Weeks    Status On-going              SLP Long Term Goals - 05/10/21 1401       SLP LONG TERM GOAL #1   Title Pt will converse for 15 minutes with abdominal breathing    Time 8    Period Weeks    Status On-going      SLP LONG TERM GOAL #2   Title Pt will report 50% reduction of chronic cough/throat clear over 2 weeks subjectively    Time 8    Period Weeks    Status On-going      SLP LONG TERM GOAL #3   Title Pt will carryover 3 strategies to report 50% improvement in vocal quality and 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 Pt reports cough has mostly resolved since ST evaluation. Pt is concerned about her voice as she experiences intermittent hoarseness and vocal fatigue. Abdominal breathing and throat clear alternatives reviewed this session. SOVTE introduced this session and to be completed before/after work. 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 Cough and voice  changes have affected her ability to project and  teach professional classes and after a presentation, she feels  her throat is strained and sore.    Speech Therapy Frequency 2x / week    Duration 8 weeks    Treatment/Interventions SLP instruction and feedback;Compensatory strategies;Functional tasks;Compensatory techniques;Internal/external aids;Patient/family education    Potential to Achieve Goals Good    SLP Home Exercise Plan provided    Consulted and Agree with Plan of Care Patient             Patient will benefit from skilled therapeutic intervention in order to improve the following deficits and impairments:   Other voice and resonance disorders    Problem List Patient Active Problem List   Diagnosis Date Noted   Chronic cough 05/08/2021   Pulmonary hypertension ([place]) 05/08/2021    Class: Question of   Left lower lobe pulmonary nodule 05/08/2021   S/P laparoscopic assisted vaginal hysterectomy (LAVH) 02/26/2018   Cystocele and rectocele with incomplete uterovaginal prolapse 02/26/2018    Alinda Deem, MA CCC-SLP 05/10/2021, 3:53 PM  [facility] [address] [place], [place], [address] Phone: [phone]   Fax:  [phone]   Name: Katelyn [name] MRN: [id number] Date of Birth: 06/26/1956

## 2021-05-10 NOTE — Patient Instructions (Addendum)
What to Do Instead of Clearing Your Throat Use these strategies instead of clearing your throat. 1. Swallow your saliva. Swallow as hard as you can.  2. Take a drink of water. 3. Suck on ice chips. 4. Use a silent cough. Whisper the word "huh" from your belly without making a sound and then swallow. This is like a cough but without using your voice. 5. Hum on an "M" and then swallow. 6. Use a light, gentle cough (like tapping your vocal folds together) and then swallow. 7. Silently count to 10 and then swallow  

## 2021-05-11 ENCOUNTER — Other Ambulatory Visit (HOSPITAL_COMMUNITY): Payer: Self-pay

## 2021-05-12 LAB — RHEUMATOID FACTOR: Rheumatoid fact SerPl-aCnc: <14 IU/mL (ref <14)

## 2021-05-12 LAB — QUANTIFERON-TB GOLD PLUS
Mitogen-NIL: >10 IU/mL
NIL: 0.04 IU/mL
QuantiFERON-TB Gold Plus: NEGATIVE
TB1-NIL: 0 IU/mL
TB2-NIL: 0 IU/mL

## 2021-05-12 LAB — CYCLIC CITRUL PEPTIDE ANTIBODY, IGG: Cyclic Citrullin Peptide Ab: <16 U

## 2021-05-12 LAB — ANTI-DNA ANTIBODY, DOUBLE-STRANDED: ds DNA Ab: 1 IU/mL

## 2021-05-12 LAB — SJOGREN'S SYNDROME ANTIBODS(SSA + SSB)
SSA (Ro) (ENA) Antibody, IgG: <1 AI
SSB (La) (ENA) Antibody, IgG: <1 AI

## 2021-05-12 LAB — ANTI-SCLERODERMA ANTIBODY: Scleroderma (Scl-70) (ENA) Antibody, IgG: <1 AI

## 2021-05-12 LAB — ANA: Anti Nuclear Antibody (ANA): NEGATIVE

## 2021-05-15 ENCOUNTER — Ambulatory Visit: Payer: 59 | Admitting: Speech Pathology

## 2021-05-17 ENCOUNTER — Ambulatory Visit: Payer: 59 | Attending: Internal Medicine | Admitting: Speech Pathology

## 2021-05-17 ENCOUNTER — Encounter: Payer: Self-pay | Admitting: Speech Pathology

## 2021-05-17 ENCOUNTER — Other Ambulatory Visit: Payer: Self-pay

## 2021-05-17 DIAGNOSIS — R498 Other voice and resonance disorders: Secondary | ICD-10-CM | POA: Insufficient documentation

## 2021-05-17 NOTE — Progress Notes (Signed)
Please let patient know her allergy markers such as eosinophils and IgE were somewhat elevated. Continue Singulair that was recently just started for this. Serology (including rheumatoid factor, ANA, scleroderma, TB, Sjogrens were negative). Follow up in 3 months with MR

## 2021-05-17 NOTE — Patient Instructions (Signed)
  10 normal hum  10 pitch glides up and down   10 sirens  National Anthem or [employer] Shirlee [place]  If you don't have a straw do the lip trills or VVVVV  Get the wedge   Great job using the straw exercises to warm up and cool down your voice  Great job drinking water  Practice your presentations with finding spots where you can take a belly breaths  When you feel vocal strain and here a vocal fry (frog/hoarse) it is a sign that you need to breathe more frequently  Feel free to bring in your presentation to practice  Notice if when you project you are holding air in your throat or you feel tension in your throat, neck or jaw

## 2021-05-17 NOTE — Therapy (Signed)
[facility] [address] [place], [place], [address] Phone: [phone]   Fax:  [phone]  Speech Language Pathology Treatment  Patient Details  Name: Katelyn [name] MRN: [id number] Date of Birth: [birth year] Referring Provider (SLP): Dr. Brand Males   Encounter Date: 05/17/2021   End of Session - 05/17/21 1550     Visit Number 3    Number of Visits 17    Date for SLP Re-Evaluation 06/23/21    SLP Start Time 1410   arrived 10 minute late   SLP Stop Time  1444    SLP Time Calculation (min) 34 min    Activity Tolerance Patient tolerated treatment well             Past Medical History:  Diagnosis Date   Cancer ([place])    thyroid cancer   Hypertension    Hypothyroidism    PONV (postoperative nausea and vomiting)    Pre-diabetes    S/P laparoscopic assisted vaginal hysterectomy (LAVH) 02/26/2018   Seasonal allergies    SVD (spontaneous vaginal delivery)    x 2    Past Surgical History:  Procedure Laterality Date   ANTERIOR AND POSTERIOR REPAIR N/A 02/26/2018   Procedure: ANTERIOR (CYSTOCELE) AND POSTERIOR REPAIR (RECTOCELE);  Surgeon: Janyth Contes, MD;  Location: [facility];  Service: Gynecology;  Laterality: N/A;   COLONOSCOPY     LAPAROSCOPIC VAGINAL HYSTERECTOMY WITH SALPINGECTOMY Bilateral 02/26/2018   Procedure: LAPAROSCOPIC ASSISTED VAGINAL HYSTERECTOMY WITH SALPINGECTOMY;  Surgeon: Janyth Contes, MD;  Location: [facility];  Service: Gynecology;  Laterality: Bilateral;   TONSILLECTOMY     TOTAL THYROIDECTOMY     in [place] EXTRACTION      There were no vitals filed for this visit.   Subjective Assessment - 05/17/21 1411     Subjective "I thought I was going to pulmlonary" Pt 10 minute late    Currently in Pain? No/denies                   ADULT SLP TREATMENT - 05/17/21 1414       General Information   Behavior/Cognition Alert;Cooperative;Pleasant mood       Treatment Provided   Treatment provided Cognitive-Linquistic      Cognitive-Linquistic Treatment   Treatment focused on Voice;Patient/family/caregiver education    Skilled Treatment Pt reports cough/thoat clear is 90% improved subjectively. She is completing SOVTE before work, before presenting and after work. She completed SOVTE (semioccluded vocal tract exercises) today with min A for more structured completion doing 10 reps of each exercise as a minimum. She is keeping a straw in the car. Trained her on SOVTE alternatives if she does not have a straw (lip trills, fricative /v/, nasals /m,n/) with occasional min A. 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 Today, we targeted abdominal breathing more frequently in speech tasks and conversation with modeling and rare min verbal cues. Pt is to go through her presentation and ID places where she is to take a good breath to power her voice. She has not needed lozenges and has reduce throat clears. Today, she cleared her throat 5x, and required verbal cues for awareness.      Assessment / Recommendations / Plan   Plan Continue with current plan of care      Progression Toward Goals   Progression toward goals Progressing toward goals  SLP Education - 05/17/21 1546     Education Details SOVTE, abdominal breathing, breath more frequently in presentations    Person(s) Educated Patient    Methods Explanation;Demonstration;Verbal cues;Handout    Comprehension Returned demonstration;Verbal cues required;Verbalized understanding;Need further instruction              SLP Short Term Goals - 05/17/21 1549       SLP SHORT TERM GOAL #1   Title Pt will demonstrate abominal breathing 18/20 sentences with rare min A    Time 3    Period Weeks    Status On-going      SLP SHORT TERM GOAL #2   Title Pt will report carryover of relaxation strategies outside of therapy 4x a week    Time 3     Period Weeks    Status On-going      SLP SHORT TERM GOAL #3   Title Pt will report decrerase in chronic cough/throat clearing by 25% subjectively    Time 3    Period Weeks    Status Achieved      SLP SHORT TERM GOAL #4   Title Pt will complete HEP for voice and vocal warm ups with rare min A    Time 4    Period Weeks    Status Achieved      SLP SHORT TERM GOAL #5   Title Pt will carryover flow phonation 18/20 sentence with rare min A    Time 3    Period Weeks    Status On-going              SLP Long Term Goals - 05/17/21 1549       SLP LONG TERM GOAL #1   Title Pt will converse for 15 minutes with abdominal breathing    Time 7    Period Weeks    Status On-going      SLP LONG TERM GOAL #2   Title Pt will report 50% reduction of chronic cough/throat clear over 2 weeks subjectively    Time 7    Period Weeks    Status On-going      SLP LONG TERM GOAL #3   Title Pt will carryover 3 strategies to report 50% improvement in vocal quality and 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 Pt reports cough has mostly resolved since ST evaluation. Pt is concerned about her voice as she experiences intermittent hoarseness and vocal fatigue. Abdominal breathing and throat clear alternatives reviewed this session. SOVTE completed this session and to be completed before/after work. 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 Cough and voice  changes have affected her ability to project and  teach  professional classes and after a presentation, she feels her throat is strained and sore. Targeted more frequent breath support in conversation.Continue skilled ST to maximize clear loud phonation for teaching professional classes. She is electing to attend 1x a week as she has a new grandbaby she is helping out with    Speech Therapy Frequency 1x /week   pt electing 1x a week   Duration 8 weeks    Treatment/Interventions SLP instruction and feedback;Compensatory strategies;Functional tasks;Compensatory techniques;Internal/external aids;Patient/family education    Potential to Achieve Goals Good             Patient will benefit from skilled therapeutic intervention in order to improve the following deficits and impairments:   Other voice and resonance disorders    Problem List Patient Active Problem List   Diagnosis Date Noted   Chronic cough 05/08/2021   Pulmonary hypertension ([facility]) 05/08/2021    Class: Question of   Left lower lobe pulmonary nodule 05/08/2021   S/P laparoscopic assisted vaginal hysterectomy (LAVH) 02/26/2018   Cystocele and rectocele with incomplete uterovaginal prolapse 02/26/2018    Anberlyn Feimster, Annye Rusk MS, CCC-SLP 05/17/2021, 3:51 PM  [facility] [address] [place], [place], [address] Phone: [phone]   Fax:  [phone]   Name: SHANEIL TAPIA MRN: [id number] Date of Birth: 31-Dec-1955

## 2021-05-18 ENCOUNTER — Other Ambulatory Visit (HOSPITAL_COMMUNITY): Payer: Self-pay

## 2021-05-18 ENCOUNTER — Ambulatory Visit (HOSPITAL_COMMUNITY)
Admission: RE | Admit: 2021-05-18 | Discharge: 2021-05-18 | Disposition: A | Discharge status: Home / Self Care | Payer: 59 | Source: Ambulatory Visit | Attending: Primary Care | Admitting: Primary Care

## 2021-05-18 DIAGNOSIS — R059 Cough, unspecified: Secondary | ICD-10-CM | POA: Insufficient documentation

## 2021-05-18 DIAGNOSIS — I272 Pulmonary hypertension, unspecified: Secondary | ICD-10-CM | POA: Insufficient documentation

## 2021-05-18 LAB — ECHOCARDIOGRAM COMPLETE
Area-P 1/2: 3.53 cm2
Calc EF: 65.1 %
S' Lateral: 2.6 cm
Single Plane A2C EF: 64.7 %
Single Plane A4C EF: 64.9 %

## 2021-05-19 ENCOUNTER — Other Ambulatory Visit (HOSPITAL_COMMUNITY): Payer: Self-pay

## 2021-05-22 ENCOUNTER — Encounter: Payer: 59 | Admitting: Speech Pathology

## 2021-05-22 ENCOUNTER — Other Ambulatory Visit (HOSPITAL_COMMUNITY): Payer: Self-pay

## 2021-05-24 ENCOUNTER — Other Ambulatory Visit (HOSPITAL_COMMUNITY): Payer: Self-pay

## 2021-05-24 ENCOUNTER — Ambulatory Visit: Payer: 59 | Admitting: Speech Pathology

## 2021-05-25 ENCOUNTER — Other Ambulatory Visit (HOSPITAL_COMMUNITY): Payer: Self-pay

## 2021-05-25 ENCOUNTER — Other Ambulatory Visit (HOSPITAL_COMMUNITY): Payer: 59

## 2021-05-26 ENCOUNTER — Other Ambulatory Visit (HOSPITAL_COMMUNITY): Payer: Self-pay

## 2021-05-29 ENCOUNTER — Other Ambulatory Visit: Payer: Self-pay

## 2021-05-29 ENCOUNTER — Ambulatory Visit: Payer: 59

## 2021-05-29 DIAGNOSIS — R498 Other voice and resonance disorders: Secondary | ICD-10-CM | POA: Diagnosis not present

## 2021-05-29 NOTE — Patient Instructions (Addendum)
If a cough is triggered, try to use abdominal breathing and relaxation techniques  Practice your presentations on your own to get additional practice with the belly breathing   Use the "voice memos" to record yourself so you can listen back to yourself

## 2021-05-29 NOTE — Therapy (Signed)
[facility] [address] [place], [place], [address] Phone: [phone]   Fax:  [phone]  Speech Language Pathology Treatment  Patient Details  Name: Katelyn [name] MRN: [id number] Date of Birth: 1956/08/31 Referring Provider (SLP): Dr. Brand Males   Encounter Date: 05/29/2021   End of Session - 05/29/21 1908     Visit Number 4    Number of Visits 17    Date for SLP Re-Evaluation 06/23/21    SLP Start Time 1531    SLP Stop Time  [id number]    SLP Time Calculation (min) 42 min    Activity Tolerance Patient tolerated treatment well             Past Medical History:  Diagnosis Date   Cancer ([place])    thyroid cancer   Hypertension    Hypothyroidism    PONV (postoperative nausea and vomiting)    Pre-diabetes    S/P laparoscopic assisted vaginal hysterectomy (LAVH) 02/26/2018   Seasonal allergies    SVD (spontaneous vaginal delivery)    x 2    Past Surgical History:  Procedure Laterality Date   ANTERIOR AND POSTERIOR REPAIR N/A 02/26/2018   Procedure: ANTERIOR (CYSTOCELE) AND POSTERIOR REPAIR (RECTOCELE);  Surgeon: Janyth Contes, MD;  Location: [facility];  Service: Gynecology;  Laterality: N/A;   COLONOSCOPY     LAPAROSCOPIC VAGINAL HYSTERECTOMY WITH SALPINGECTOMY Bilateral 02/26/2018   Procedure: LAPAROSCOPIC ASSISTED VAGINAL HYSTERECTOMY WITH SALPINGECTOMY;  Surgeon: Janyth Contes, MD;  Location: [facility];  Service: Gynecology;  Laterality: Bilateral;   TONSILLECTOMY     TOTAL THYROIDECTOMY     in [place] EXTRACTION      There were no vitals filed for this visit.   Subjective Assessment - 05/29/21 1538     Subjective "I had a coughing episode in the waiting room. It was really hot" re: triggers    Currently in Pain? No/denies                   ADULT SLP TREATMENT - 05/29/21 0001       General Information   Behavior/Cognition Alert;Cooperative;Pleasant mood       Treatment Provided   Treatment provided Cognitive-Linquistic      Cognitive-Linquistic Treatment   Treatment focused on Voice;Patient/family/caregiver education    Skilled Treatment Pt reports she just experienced a coughing episode in the waiting room as she was triggered by the heat. Water immediately reduced coughing per patient. SLP reviewed using abdominal breathing and relaxation techniques when coughing episodes occur. Pt verbalized understanding but reports she rarely has opportunities to practice these techniques as coughing episodes are now rare. Pt presented short segments of work powerpoint, in which occasional to rare verbal and visual cues required to utilize abdominal breathing to optimize breath support. Pt exhibits increasing awareness of when extra breaths are needed while presenting. SLP recommended pt practice presentations at home and record self as feedback.      Assessment / Recommendations / Plan   Plan Continue with current plan of care      Progression Toward Goals   Progression toward goals Progressing toward goals              SLP Education - 05/29/21 1908     Education Details awareness of when breath support needed, VCD techniques    Person(s) Educated Patient    Methods Explanation;Demonstration;Handout    Comprehension Verbalized understanding;Returned demonstration  SLP Short Term Goals - 05/29/21 1909       SLP SHORT TERM GOAL #1   Title Pt will demonstrate abominal breathing 18/20 sentences with rare min A    Time 2    Period Weeks    Status On-going      SLP SHORT TERM GOAL #2   Title Pt will report carryover of relaxation strategies outside of therapy 4x a week    Time 2    Period Weeks    Status On-going      SLP SHORT TERM GOAL #3   Title Pt will report decrerase in chronic cough/throat clearing by 25% subjectively    Status Achieved      SLP SHORT TERM GOAL #4   Title Pt will complete HEP for voice and vocal warm  ups with rare min A    Status Achieved      SLP SHORT TERM GOAL #5   Title Pt will carryover flow phonation 18/20 sentence with rare min A    Time 2    Period Weeks    Status On-going              SLP Long Term Goals - 05/29/21 1910       SLP LONG TERM GOAL #1   Title Pt will converse for 15 minutes with abdominal breathing    Time 6    Period Weeks    Status On-going      SLP LONG TERM GOAL #2   Title Pt will report 50% reduction of chronic cough/throat clear over 2 weeks subjectively    Time 6    Period Weeks    Status On-going      SLP LONG TERM GOAL #3   Title Pt will carryover 3 strategies to report 50% improvement in vocal quality and 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 Pt reports cough has mostly resolved since ST evaluation. Pt is concerned about her voice as she experiences intermittent hoarseness and vocal fatigue. Abdominal breathing targeted in mock presentation of work powerpoint, with occasional fading to rare min A needed to optimize breath support.  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 Voice changes have affected her ability to project and  teach professional classes and after a presentation, she feels her voice is still somewhat hoarse. Targeted more frequent breath support in conversation.Continue skilled ST to maximize clear loud phonation for teaching professional classes. She is electing to attend 1x a  week as she has a new grandbaby she is helping out with    Speech Therapy Frequency 1x /week   pt electing 1x a week   Duration 8 weeks  Treatment/Interventions SLP instruction and feedback;Compensatory strategies;Functional tasks;Compensatory techniques;Internal/external aids;Patient/family education    Potential to Achieve Goals Good    SLP Home Exercise Plan provided    Consulted and Agree with Plan of Care Patient             Patient will benefit from skilled therapeutic intervention in order to improve the following deficits and impairments:   Other voice and resonance disorders    Problem List Patient Active Problem List   Diagnosis Date Noted   Chronic cough 05/08/2021   Pulmonary hypertension ([place]) 05/08/2021    Class: Question of   Left lower lobe pulmonary nodule 05/08/2021   S/P laparoscopic assisted vaginal hysterectomy (LAVH) 02/26/2018   Cystocele and rectocele with incomplete uterovaginal prolapse 02/26/2018    Alinda Deem, MA CCC-SLP 05/29/2021, 7:16 PM  [facility] [address] [place], [place], [address] Phone: [phone]   Fax:  [phone]   Name: MYLIAH SPROWL MRN: [id number] Date of Birth: 05-28-1956

## 2021-05-31 NOTE — Progress Notes (Signed)
Echocardiogram was normal; unable to assess PA pressure. If she has no shortness of breath symptoms would not recommend further work up, if she does we can refer her to cardiology for possible heart cath

## 2021-06-01 ENCOUNTER — Other Ambulatory Visit (HOSPITAL_COMMUNITY): Payer: Self-pay

## 2021-06-01 MED ORDER — INSULIN PEN NEEDLE 31G X 5 MM MISC
0 refills | Status: DC
  Filled 2021-06-01: qty 100, 90d supply, fill #0

## 2021-06-02 ENCOUNTER — Other Ambulatory Visit: Payer: Self-pay | Admitting: Primary Care

## 2021-06-02 NOTE — Progress Notes (Signed)
Opened in error

## 2021-06-02 NOTE — Progress Notes (Signed)
I called the patient and gave her results per provider and she voices understanding. She will call back to let us know if she has any new symptoms to call the office and we would place the referral.

## 2021-06-05 ENCOUNTER — Other Ambulatory Visit: Payer: Self-pay

## 2021-06-05 ENCOUNTER — Ambulatory Visit: Payer: 59 | Admitting: Speech Pathology

## 2021-06-05 DIAGNOSIS — R498 Other voice and resonance disorders: Secondary | ICD-10-CM | POA: Diagnosis not present

## 2021-06-05 NOTE — Patient Instructions (Addendum)
  Remember in presentation to sniff when you are presenting and conversing  Do voice warm up with straws before you present (in the car on the way)   When you feel a cough or throat clear coming on, try to pre-treat it with abdominal sniff blow   Or if you get into a cough, try to get into a relaxed sniff blow pattern and don't talk until the feeling has passed  If you get coughing in dry heat in the winter consider a humidifier  Listen for the "fry" in your voice - if you hear it or feel strain make sure you are breathing

## 2021-06-06 NOTE — Therapy (Signed)
[facility] [address] [place], [place], [address] Phone: [phone]   Fax:  [phone]  Speech Language Pathology Treatment & Discharge Summary  Patient Details  Name: Katelyn [name] MRN: [id number] Date of Birth: 1956/04/20 Referring Provider (SLP): Dr. Brand Males   Encounter Date: 06/05/2021   End of Session - 06/05/21 1447     Visit Number 5    Number of Visits 17    Date for SLP Re-Evaluation 06/23/21    SLP Start Time 1404    SLP Stop Time  47    SLP Time Calculation (min) 28 min             Past Medical History:  Diagnosis Date   Cancer ([place])    thyroid cancer   Hypertension    Hypothyroidism    PONV (postoperative nausea and vomiting)    Pre-diabetes    S/P laparoscopic assisted vaginal hysterectomy (LAVH) 02/26/2018   Seasonal allergies    SVD (spontaneous vaginal delivery)    x 2    Past Surgical History:  Procedure Laterality Date   ANTERIOR AND POSTERIOR REPAIR N/A 02/26/2018   Procedure: ANTERIOR (CYSTOCELE) AND POSTERIOR REPAIR (RECTOCELE);  Surgeon: Janyth Contes, MD;  Location: [facility];  Service: Gynecology;  Laterality: N/A;   COLONOSCOPY     LAPAROSCOPIC VAGINAL HYSTERECTOMY WITH SALPINGECTOMY Bilateral 02/26/2018   Procedure: LAPAROSCOPIC ASSISTED VAGINAL HYSTERECTOMY WITH SALPINGECTOMY;  Surgeon: Janyth Contes, MD;  Location: [facility];  Service: Gynecology;  Laterality: Bilateral;   TONSILLECTOMY     TOTAL THYROIDECTOMY     in [place] EXTRACTION      There were no vitals filed for this visit.   Subjective Assessment - 06/05/21 1407     Subjective "I think I'm good if you think so"    Currently in Pain? No/denies                   ADULT SLP TREATMENT - 06/05/21 1408       General Information   Behavior/Cognition Alert;Cooperative;Pleasant mood      Treatment Provided   Treatment provided Cognitive-Linquistic       Cognitive-Linquistic Treatment   Treatment focused on Voice;Patient/family/caregiver education    Skilled Treatment Katelyn [name] enters room with clear phonation and abdominal breathing in conversation with supervision cues. Katelyn [name] practiced her presentation for work with good volume/projection and frequent abdominal breathing to reduce laryngeal strain and power voice. 1 episode of glottal fry noted which Katelyn [name]. No evident of tesnion dysphonia, hoarseness today. Katelyn [name] has not had a cough drop in several weeks and is using sniff blow to reduce duration of cough, however this has not been needed lately. Katelyn [name] will continue on PPI. Katelyn [name] scored a 0/40 on the Cough Severity Index, a great improvement from initial score of 24/40. Per her report, couhg has improved greater than 75% subjectively.      Assessment / Recommendations / Plan   Plan Discharge SLP treatment due to (comment)      Progression Toward Goals   Progression toward goals Goals met, education completed, patient discharged from [facility] - 06/05/21 1439     Education Details semioccluded vocal tract exercises prior to presetning, listen for fry voice and breathe if it is noted    Person(s) Educated Patient    Methods Explanation;Demonstration;Handout    Comprehension Verbalized understanding;Returned demonstration  SPEECH THERAPY DISCHARGE SUMMARY  Visits from Start of Care: 5  Current functional level related to goals / functional outcomes: See goals   Remaining deficits: Occasional cough   Education / Equipment:  Abdominal breathing for cough, vocal hygiene, HEP for voice  Patient agrees to discharge. Patient goals were met. Patient is being discharged due to meeting the stated rehab goals.Marland Kitchen        SLP Short Term Goals - 06/05/21 1446       SLP SHORT TERM GOAL #1   Title Pt will demonstrate abominal breathing 18/20 sentences with rare min A    Time 2     Period Weeks    Status Achieved      SLP SHORT TERM GOAL #2   Title Pt will report carryover of relaxation strategies outside of therapy 4x a week    Baseline per pt report 06/05/21    Time 2    Period Weeks    Status Achieved      SLP SHORT TERM GOAL #3   Title Pt will report decrerase in chronic cough/throat clearing by 25% subjectively    Status Achieved      SLP SHORT TERM GOAL #4   Title Pt will complete HEP for voice and vocal warm ups with rare min A    Status Achieved      SLP SHORT TERM GOAL #5   Title Pt will carryover flow phonation 18/20 sentence with rare min A    Time 2    Period Weeks    Status Achieved              SLP Long Term Goals - 06/05/21 1447       SLP LONG TERM GOAL #1   Title Pt will converse for 15 minutes with abdominal breathing    Time 6    Period Weeks    Status Achieved      SLP LONG TERM GOAL #2   Title Pt will report 50% reduction of chronic cough/throat clear over 2 weeks subjectively    Time 6    Period Weeks    Status Achieved      SLP LONG TERM GOAL #3   Title Pt will carryover 3 strategies to report 50% improvement in vocal quality and 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 Katelyn [name] has practiced her presentations focusing on breath support for volume. Katelyn [name] improved her score on Cough Severity Index from 24 to 0. Katelyn [name] is aware to continue semioccluded voice  warm ups prior to presenting or meeting. Phonation clear today in mock presentation and conversation with 1 episode of vocal fry Katelyn [name] self [name] with breath.  Goals met, pt in agreement with d/c. Katelyn [name] will f/u with Dr. Chase Caller in 6-8 weeks. D/c ST at this time.    Speech Therapy Frequency 1x /week    Duration 8 weeks    Treatment/Interventions SLP instruction and feedback;Compensatory strategies;Functional tasks;Compensatory techniques;Internal/external aids;Patient/family education    Potential to Achieve Goals Good             Patient will benefit from skilled therapeutic intervention in order to improve the following deficits and impairments:   Other voice and resonance disorders    Problem List Patient Active Problem List   Diagnosis Date Noted   Chronic cough 05/08/2021   Pulmonary hypertension ([place]) 05/08/2021    Class: Question of   Left lower lobe pulmonary nodule 05/08/2021   S/P laparoscopic assisted vaginal hysterectomy (LAVH) 02/26/2018   Cystocele and rectocele with incomplete uterovaginal prolapse 02/26/2018    Katelyn [name], [place], CCC-SLP 06/06/2021, 3:58 PM  [facility] [address] [place], [place], [address] Phone: [phone]   Fax:  [phone]   Name: Katelyn [name] MRN: [id number] Date of Birth: Jul 22, 1956

## 2021-06-12 ENCOUNTER — Other Ambulatory Visit (HOSPITAL_COMMUNITY): Payer: Self-pay

## 2021-06-12 ENCOUNTER — Ambulatory Visit: Payer: 59 | Admitting: Speech Pathology

## 2021-06-21 ENCOUNTER — Encounter: Payer: 59 | Admitting: Speech Pathology

## 2021-06-22 DIAGNOSIS — N6452 Nipple discharge: Secondary | ICD-10-CM | POA: Diagnosis not present

## 2021-06-23 ENCOUNTER — Other Ambulatory Visit: Payer: Self-pay | Admitting: Obstetrics and Gynecology

## 2021-06-23 DIAGNOSIS — N6452 Nipple discharge: Secondary | ICD-10-CM

## 2021-07-11 DIAGNOSIS — D171 Benign lipomatous neoplasm of skin and subcutaneous tissue of trunk: Secondary | ICD-10-CM | POA: Diagnosis not present

## 2021-07-11 DIAGNOSIS — L738 Other specified follicular disorders: Secondary | ICD-10-CM | POA: Diagnosis not present

## 2021-07-11 DIAGNOSIS — D225 Melanocytic nevi of trunk: Secondary | ICD-10-CM | POA: Diagnosis not present

## 2021-07-11 DIAGNOSIS — L723 Sebaceous cyst: Secondary | ICD-10-CM | POA: Diagnosis not present

## 2021-07-11 DIAGNOSIS — L570 Actinic keratosis: Secondary | ICD-10-CM | POA: Diagnosis not present

## 2021-07-11 DIAGNOSIS — L918 Other hypertrophic disorders of the skin: Secondary | ICD-10-CM | POA: Diagnosis not present

## 2021-07-11 DIAGNOSIS — D224 Melanocytic nevi of scalp and neck: Secondary | ICD-10-CM | POA: Diagnosis not present

## 2021-07-11 DIAGNOSIS — D2372 Other benign neoplasm of skin of left lower limb, including hip: Secondary | ICD-10-CM | POA: Diagnosis not present

## 2021-07-11 DIAGNOSIS — D2261 Melanocytic nevi of right upper limb, including shoulder: Secondary | ICD-10-CM | POA: Diagnosis not present

## 2021-07-24 ENCOUNTER — Other Ambulatory Visit (HOSPITAL_COMMUNITY): Payer: Self-pay

## 2021-07-31 ENCOUNTER — Ambulatory Visit
Admission: RE | Admit: 2021-07-31 | Discharge: 2021-07-31 | Disposition: A | Discharge status: Home / Self Care | Payer: 59 | Source: Ambulatory Visit | Attending: Obstetrics and Gynecology | Admitting: Obstetrics and Gynecology

## 2021-07-31 ENCOUNTER — Other Ambulatory Visit: Payer: Self-pay

## 2021-07-31 DIAGNOSIS — Z85850 Personal history of malignant neoplasm of thyroid: Secondary | ICD-10-CM | POA: Diagnosis not present

## 2021-07-31 DIAGNOSIS — N644 Mastodynia: Secondary | ICD-10-CM | POA: Diagnosis not present

## 2021-07-31 DIAGNOSIS — E785 Hyperlipidemia, unspecified: Secondary | ICD-10-CM | POA: Diagnosis not present

## 2021-07-31 DIAGNOSIS — Z0000 Encounter for general adult medical examination without abnormal findings: Secondary | ICD-10-CM | POA: Diagnosis not present

## 2021-07-31 DIAGNOSIS — N6452 Nipple discharge: Secondary | ICD-10-CM | POA: Diagnosis not present

## 2021-07-31 DIAGNOSIS — R922 Inconclusive mammogram: Secondary | ICD-10-CM | POA: Diagnosis not present

## 2021-07-31 DIAGNOSIS — E669 Obesity, unspecified: Secondary | ICD-10-CM | POA: Diagnosis not present

## 2021-07-31 DIAGNOSIS — I10 Essential (primary) hypertension: Secondary | ICD-10-CM | POA: Diagnosis not present

## 2021-07-31 DIAGNOSIS — R7303 Prediabetes: Secondary | ICD-10-CM | POA: Diagnosis not present

## 2021-07-31 DIAGNOSIS — E890 Postprocedural hypothyroidism: Secondary | ICD-10-CM | POA: Diagnosis not present

## 2021-08-09 ENCOUNTER — Other Ambulatory Visit (HOSPITAL_COMMUNITY): Payer: Self-pay

## 2021-08-09 DIAGNOSIS — Z7984 Long term (current) use of oral hypoglycemic drugs: Secondary | ICD-10-CM | POA: Diagnosis not present

## 2021-08-09 DIAGNOSIS — E890 Postprocedural hypothyroidism: Secondary | ICD-10-CM | POA: Diagnosis not present

## 2021-08-09 DIAGNOSIS — E785 Hyperlipidemia, unspecified: Secondary | ICD-10-CM | POA: Diagnosis not present

## 2021-08-09 DIAGNOSIS — R7303 Prediabetes: Secondary | ICD-10-CM | POA: Diagnosis not present

## 2021-08-09 DIAGNOSIS — Z85850 Personal history of malignant neoplasm of thyroid: Secondary | ICD-10-CM | POA: Diagnosis not present

## 2021-08-09 MED ORDER — WEGOVY 0.25 MG/0.5ML ~~LOC~~ SOAJ
0.2500 mg | SUBCUTANEOUS | 0 refills | Status: DC
  Filled 2021-10-10 – 2021-10-20 (×3): qty 2, 28d supply, fill #0

## 2021-08-09 MED FILL — Metformin HCl Tab ER 24HR 500 MG: ORAL | 90 days supply | Qty: 360 | Fill #1 | Status: AC

## 2021-08-10 ENCOUNTER — Other Ambulatory Visit (HOSPITAL_COMMUNITY): Payer: Self-pay

## 2021-08-28 ENCOUNTER — Other Ambulatory Visit (HOSPITAL_COMMUNITY): Payer: Self-pay

## 2021-09-08 ENCOUNTER — Other Ambulatory Visit (HOSPITAL_COMMUNITY): Payer: Self-pay

## 2021-09-11 ENCOUNTER — Other Ambulatory Visit (HOSPITAL_COMMUNITY): Payer: Self-pay

## 2021-09-26 ENCOUNTER — Other Ambulatory Visit (HOSPITAL_COMMUNITY): Payer: Self-pay

## 2021-09-27 ENCOUNTER — Other Ambulatory Visit (HOSPITAL_COMMUNITY): Payer: Self-pay

## 2021-09-27 DIAGNOSIS — J069 Acute upper respiratory infection, unspecified: Secondary | ICD-10-CM | POA: Diagnosis not present

## 2021-09-27 DIAGNOSIS — F4321 Adjustment disorder with depressed mood: Secondary | ICD-10-CM | POA: Diagnosis not present

## 2021-09-27 MED ORDER — FLUTICASONE PROPIONATE 50 MCG/ACT NA SUSP
1.0000 | Freq: Every day | NASAL | 1 refills | Status: DC
  Filled 2021-09-27: qty 16, 60d supply, fill #0

## 2021-09-27 MED ORDER — ESCITALOPRAM OXALATE 10 MG PO TABS
10.0000 mg | ORAL_TABLET | Freq: Every day | ORAL | 1 refills | Status: DC
  Filled 2021-09-27: qty 60, 60d supply, fill #0
  Filled 2021-11-29: qty 60, 60d supply, fill #1

## 2021-09-27 MED ORDER — BENZONATATE 200 MG PO CAPS
200.0000 mg | ORAL_CAPSULE | Freq: Three times a day (TID) | ORAL | 0 refills | Status: DC
  Filled 2021-09-27: qty 30, 10d supply, fill #0

## 2021-09-27 MED ORDER — ALBUTEROL SULFATE HFA 108 (90 BASE) MCG/ACT IN AERS
1.0000 | INHALATION_SPRAY | RESPIRATORY_TRACT | 1 refills | Status: DC | PRN
  Filled 2021-09-27: qty 36, 66d supply, fill #0
  Filled 2021-09-27: qty 18, 33d supply, fill #0
  Filled 2022-02-26: qty 18, 33d supply, fill #1

## 2021-10-02 ENCOUNTER — Ambulatory Visit (INDEPENDENT_AMBULATORY_CARE_PROVIDER_SITE_OTHER): Payer: 59 | Admitting: Acute Care

## 2021-10-02 ENCOUNTER — Other Ambulatory Visit: Payer: Self-pay

## 2021-10-02 ENCOUNTER — Ambulatory Visit (INDEPENDENT_AMBULATORY_CARE_PROVIDER_SITE_OTHER): Payer: 59

## 2021-10-02 ENCOUNTER — Encounter: Payer: Self-pay | Admitting: Acute Care

## 2021-10-02 ENCOUNTER — Other Ambulatory Visit (HOSPITAL_COMMUNITY): Payer: Self-pay

## 2021-10-02 DIAGNOSIS — R059 Cough, unspecified: Secondary | ICD-10-CM | POA: Diagnosis not present

## 2021-10-02 DIAGNOSIS — R0989 Other specified symptoms and signs involving the circulatory and respiratory systems: Secondary | ICD-10-CM

## 2021-10-02 LAB — D-DIMER, QUANTITATIVE: D-Dimer, Quant: 0.57 mcg/mL FEU — ABNORMAL HIGH (ref <0.50)

## 2021-10-02 MED ORDER — PREDNISONE 10 MG PO TABS
ORAL_TABLET | ORAL | 0 refills | Status: DC
  Filled 2021-10-02: qty 20, 8d supply, fill #0

## 2021-10-02 MED ORDER — HYDROCODONE BIT-HOMATROP MBR 5-1.5 MG/5ML PO SOLN
5.0000 mL | Freq: Every evening | ORAL | 0 refills | Status: DC | PRN
  Filled 2021-10-02: qty 120, 24d supply, fill #0

## 2021-10-02 MED ORDER — ALBUTEROL SULFATE (2.5 MG/3ML) 0.083% IN NEBU
2.5000 mg | INHALATION_SOLUTION | Freq: Four times a day (QID) | RESPIRATORY_TRACT | 12 refills | Status: DC | PRN
  Filled 2021-10-02: qty 75, 7d supply, fill #0
  Filled 2022-09-24 – 2022-10-02 (×2): qty 75, 7d supply, fill #1

## 2021-10-02 MED ORDER — AMOXICILLIN-POT CLAVULANATE 875-125 MG PO TABS
1.0000 | ORAL_TABLET | Freq: Two times a day (BID) | ORAL | 0 refills | Status: DC
  Filled 2021-10-02: qty 14, 7d supply, fill #0

## 2021-10-02 MED ORDER — SODIUM CHLORIDE 3 % IN NEBU
INHALATION_SOLUTION | RESPIRATORY_TRACT | 12 refills | Status: DC | PRN
  Filled 2021-10-02: qty 750, fill #0

## 2021-10-02 NOTE — Progress Notes (Signed)
History of Present Illness Katelyn [name] is a 65 y.o. female never smoker Past medical history significant for laparoscopic vaginal hysterectomy.  Patient of Dr. Chase Caller, seen for initial consult on 04/07/2021 for cough.   10/02/2021 Pt. Has been doing well until a bus trip x 6 hours each way over a 3 day period of time. She developed the cough after this and has had it for 2-3 weeks. She was using cough drops. Thought she had a sinus infection and was taking her Zyrtec, Singulair  and Mucinex. She has been pushing fluids . She went to a party 12/13 2022 and she had a coughing spell that felt like a bronchospasm . She used her albuterol inhaler and this really helped. She is under a vent at work which has also been triggering a cough. She has been using Albuterol inhaler to stop the cough. This has been working. She said she never dropped her sats with cough. She has some hoarseness. This is new today. She has a non-productive cough. She had a PCP visit virtually. Covid test was negative . The provider did not want to add steroids or antibiotics.  She does wheeze with coughing spells. RUL.   Test Results: CXR 10/02/2021 Interlobular septal thickening suggests early edema, though atypical infection could have this appearance  CBC Latest Ref Rng & Units 04/07/2021 10/29/2019 02/27/2018  WBC 4.0 - 10.5 K/uL 6.8 8.8 11.0(H)  Hemoglobin 12.0 - 15.0 g/dL 12.4 13.2 11.5(L)  Hematocrit 36.0 - 46.0 % 36.9 41.9 35.6(L)  Platelets 150.0 - 400.0 K/uL 302.0 290 217    BMP Latest Ref Rng & Units 10/29/2019 02/27/2018 02/17/2018  Glucose 70 - 99 mg/dL 122(H) 109(H) 111(H)  BUN 8 - 23 mg/dL 9 8 17   Creatinine 0.44 - 1.00 mg/dL 0.68 0.73 0.98  Sodium 135 - 145 mmol/L 139 141 137  Potassium 3.5 - 5.1 mmol/L 4.3 4.3 4.4  Chloride 98 - 111 mmol/L 104 105 105  CO2 22 - 32 mmol/L 26 27 22   Calcium 8.9 - 10.3 mg/dL 8.7(L) 8.4(L) 9.0    BNP No results found for: BNP  ProBNP No results found for:  PROBNP  PFT No results found for: FEV1PRE, FEV1POST, FVCPRE, FVCPOST, TLC, DLCOUNC, PREFEV1FVCRT, PSTFEV1FVCRT  No results found.   Past medical hx Past Medical History:  Diagnosis Date   Cancer ([place])    thyroid cancer   Hypertension    Hypothyroidism    PONV (postoperative nausea and vomiting)    Pre-diabetes    S/P laparoscopic assisted vaginal hysterectomy (LAVH) 02/26/2018   Seasonal allergies    SVD (spontaneous vaginal delivery)    x 2     Social History   Tobacco Use   Smoking status: Never   Smokeless tobacco: Never  Vaping Use   Vaping Use: Never used  Substance Use Topics   Alcohol use: Never   Drug use: Never    Ms.Erhard reports that she has never smoked. She has never used smokeless tobacco. She reports that she does not drink alcohol and does not use drugs.  Tobacco Cessation: Never smoker   Past surgical hx, Family hx, Social hx all reviewed.  Current Outpatient Medications on File Prior to Visit  Medication Sig   albuterol (VENTOLIN HFA) 108 (90 Base) MCG/ACT inhaler Inhale 1 puff into the lungs every 4 (four) hours as needed.   albuterol (VENTOLIN HFA) 108 (90 Base) MCG/ACT inhaler Inhale 1 puff into the lungs every 4 (four) hours as needed.  aspirin EC 81 MG tablet Take 81 mg by mouth daily.   atorvastatin (LIPITOR) 10 MG tablet Take 1 tablet (10 mg total) by mouth at bedtime.   benzonatate (TESSALON PERLES) 100 MG capsule Take 1 capsule (100 mg total) by mouth 3 (three) times daily as needed.   benzonatate (TESSALON) 100 MG capsule Take 1-2 capsules (100-200 mg total) by mouth at bedtime as needed for cough.   benzonatate (TESSALON) 200 MG capsule Take 1 capsule (200 mg total) by mouth 3 (three) times daily.   bisoprolol (ZEBETA) 5 MG tablet Take 1 tablet (5 mg total) by mouth daily.   Calcium Carb-Cholecalciferol (CALCIUM+D3 PO) Take 1 tablet by mouth at bedtime.   Cholecalciferol (VITAMIN D3 PO) Take 1 tablet by mouth 2 (two) times a week.    Cyanocobalamin (VITAMIN B-12 PO) Take 1 tablet by mouth at bedtime.   escitalopram (LEXAPRO) 10 MG tablet TAKE 1 TABLET BY MOUTH ONCE DAILY   escitalopram (LEXAPRO) 10 MG tablet Take 1 tablet (10 mg total) by mouth daily.   fluticasone (FLONASE) 50 MCG/ACT nasal spray Place 1 spray into both nostrils daily.   fluticasone (FLOVENT HFA) 110 MCG/ACT inhaler Inhale 2 puffs into the lungs 2 (two) times daily.   ibuprofen (ADVIL,MOTRIN) 800 MG tablet Take 1 tablet (800 mg total) by mouth every 8 (eight) hours as needed (mild pain).   Insulin Pen Needle 31G X 5 MM MISC Use as directed with Saxenda   levothyroxine (SYNTHROID) 112 MCG tablet Take 1 tablet (112 mcg total) by mouth daily in the morning on an empty stomach.   levothyroxine (SYNTHROID) 112 MCG tablet Take 1 tablet (112 mcg total) by mouth in the morning on an empty stomach once daily   Liraglutide -Weight Management (SAXENDA) 18 MG/3ML SOPN Inject 0.6mg  into the skin daily for 1 week, then 1.2mg  daily for 1 week, then 1.8mg  daily for 1 week, then 2.4mg  daily for 1 week, then 3mg  daily   losartan-hydrochlorothiazide (HYZAAR) 100-12.5 MG tablet TAKE 1 TABLET BY MOUTH ONCE A DAY   losartan-hydrochlorothiazide (HYZAAR) 100-12.5 MG tablet Take 1 tablet by mouth daily.   meclizine (ANTIVERT) 25 MG tablet Take 1 tablet (25 mg total) by mouth 3 (three) times daily as needed for dizziness.   metFORMIN (GLUCOPHAGE-XR) 500 MG 24 hr tablet Take 1,000 mg by mouth 2 (two) times daily.   metFORMIN (GLUCOPHAGE-XR) 500 MG 24 hr tablet TAKE 4 TABLETS BY MOUTH ONCE A DAY   montelukast (SINGULAIR) 10 MG tablet Take 1 tablet (10 mg total) by mouth at bedtime.   omeprazole (PRILOSEC) 20 MG capsule Take 1 capsule (20 mg total) by mouth daily.   Semaglutide-Weight Management (WEGOVY) 0.25 MG/0.5ML SOAJ Inject 0.25 mg into the skin once a week.   Semaglutide-Weight Management (WEGOVY) 0.25 MG/0.5ML SOAJ Inject 0.25 mg into the skin once a week.   traMADol (ULTRAM) 50  MG tablet Take 1 tablet (50 mg total) by mouth every 6 (six) hours as needed for moderate pain.   levothyroxine (SYNTHROID) 112 MCG tablet TAKE 1 TABLET BY MOUTH ON AN EMPTY STOMACH DAILY IN THE MORNING   No current facility-administered medications on file prior to visit.     Allergies  Allergen Reactions   Lisinopril Cough   Sulfa Antibiotics Rash    Review Of Systems:  Constitutional:   No  weight loss, night sweats,  Fevers, chills, fatigue, or  lassitude.  HEENT:   No headaches,  Difficulty swallowing,  Tooth/dental problems, or  Sore  throat,                No sneezing, itching, ear ache, +nasal congestion, post nasal drip,   CV:  No chest pain,  Orthopnea, PND, swelling in lower extremities, anasarca, dizziness, palpitations, syncope.   GI  No heartburn, indigestion, abdominal pain, nausea, vomiting, diarrhea, change in bowel habits, loss of appetite, bloody stools.   Resp: + shortness of breath with exertion or at rest.  No  excess mucus, No  productive cough,  + non-productive cough,  No coughing up of blood.  No change in color of mucus.  + wheezing.  No chest wall deformity  Skin: no rash or lesions.  GU: no dysuria, change in color of urine, no urgency or frequency.  No flank pain, no hematuria   MS:  No joint pain or swelling.  No decreased range of motion.  No back pain.  Psych:  No change in mood or affect. No depression or anxiety.  No memory loss.   Vital Signs BP 128/78 (BP Location: Left Arm, Patient Position: Sitting, Cuff Size: Normal)    Pulse 85    Temp 98.2 F (36.8 C) (Oral)    Ht 5\' 5"  (1.651 m)    Wt 261 lb (118.4 kg)    LMP  (LMP Unknown)    SpO2 96%    BMI 43.43 kg/m    Physical Exam:  General- No distress,  A&Ox3, pleasant ENT: No sinus tenderness, TM clear, pale nasal mucosa, no oral exudate,no post nasal drip, no LAN Cardiac: S1, S2, regular rate and rhythm, no murmur Chest: + wheeze/ No rales/ dullness; no accessory muscle use, no nasal  flaring, no sternal retractions Abd.: Soft Non-tender, ND, BS +, Body mass index is 43.43 kg/m.  Ext: No clubbing cyanosis, edema Neuro:  normal strength, MAE x 4, A&O x 3 Skin: No rashes, warm and dry, No lesions  Psych: normal mood and behavior   Assessment/Plan Acute on Chronic Cough with Wheezing and viral symptoms CXR with early edema vs atypical infection  Plan We will do a CXR today. We will call you with results We will send in albuterol nebs. We will send in an order for a nebulizer machine  Continue using albuterol inhaler as needed if not at home. Please use these daily x 2 while you are sick ( Morning and evening)  Prednisone taper; 10 mg tablets: 4 tabs x 2 days, 3 tabs x 2 days, 2 tabs x 2 days 1 tab x 2 days then stop.  Sips of water instead of throat clearing Sugar Free [employer] or [employer] originals for throat soothing. Delsym Cough syrup 5 cc's every 12 hours Non-sedating antihistamine of your choice daily ( Zyrtec, Allegra, Xyzol, Claritin ( Generic ok)  Continue Omeprazole 20mg  once daily before first meal  Start Singulair 10mg  at bedtime, allergic component could be contributing to cough Hydromet 5 cc's at bedtime as needed for cough. Do not use Tessalon Perles and Hydromet together. Use one or the other.  Do not drive if sleepy. Follow up in 2 weeks with Judson Roch NP or Eustaquio Maize NP Please contact office for sooner follow up if symptoms do not improve or worsen or seek emergency care   Call us if you need [place] sooner  Addendum I have called the patient with the results of her CXR. I explained the results were  early edema vs atypical infection . Symptoms are more likely post viral URI.  We will treat with  Augmentin x 7 days. I have asked her to call the office if she does not feel better in the next few days.  D-Dimer was slightly elevated, but patient denies any chest pain, back pain, swelling of tenderness in her legs/ knee.    Pulmonary Embolism [place]  Score Select Criteria:  Symptoms of DVT (3 points)>> No >> 0  No alternative diagnosis better explains the illness (3 points)>> N0 >> 0  Tachycardia with pulse > 100 (1.5 points)>> No>> 85 bpm>> 0  Immobilization (>= 3 days) or surgery in the previous four weeks (1.5 points)>> No >> 0  Prior history of DVT or pulmonary embolism (1.5 points)>> No >> 0  Presence of hemoptysis (1 point). No >> 0  Presence of malignancy (1 point)  No >> 0 Results: Total Criteria Point Count:  0  Low Probability Risk   Pulmonary Embolism Risk Score Interpretation Score > 6: High probability Score >= 2 and <= 6: Moderate probability Score < 2: Low Probability   I spent 45 minutes dedicated to the care of this patient on the date of this encounter to include pre-visit review of records, face-to-face time with the patient discussing conditions above, post visit ordering of testing, clinical documentation with the electronic health record, making appropriate referrals as documented, and communicating necessary information to the patient's healthcare team.   Magdalen Spatz, NP 10/02/2021  11:47 AM

## 2021-10-02 NOTE — Patient Instructions (Addendum)
We will do a CXR today. We will call you with results We will send in albuterol nebs. We will send in an order for a nebulizer machine  Continue using albuterol inhaler as needed if not at home. Please use these daily x 2 while you are sick ( Morning and evening)  Prednisone taper; 10 mg tablets: 4 tabs x 2 days, 3 tabs x 2 days, 2 tabs x 2 days 1 tab x 2 days then stop.  Sips of water instead of throat clearing Sugar Free [employer] or [employer] originals for throat soothing. Delsym Cough syrup 5 cc's every 12 hours Non-sedating antihistamine of your choice daily ( Zyrtec, Allegra, Xyzol, Claritin ( Generic ok)  Continue Omeprazole 20mg  once daily before first meal  Start Singulair 10mg  at bedtime, allergic component could be contributing to cough Hydromet 5 cc's at bedtime as needed for cough. Do not use Tessalon Perles and Hydromet together. Use one or the other.  Do not drive if sleepy. Follow up in 2 weeks with Judson Roch NP or Eustaquio Maize NP Please contact office for sooner follow up if symptoms do not improve or worsen or seek emergency care   Call us if you need [place] sooner

## 2021-10-03 ENCOUNTER — Other Ambulatory Visit (HOSPITAL_COMMUNITY): Payer: Self-pay

## 2021-10-03 ENCOUNTER — Telehealth: Payer: Self-pay | Admitting: Acute Care

## 2021-10-03 NOTE — Telephone Encounter (Signed)
Spoke with pt and informed her nebulizer machine order had been placed with [employer]. Pt stated understanding. Nothing further needed at this time.

## 2021-10-04 DIAGNOSIS — J398 Other specified diseases of upper respiratory tract: Secondary | ICD-10-CM | POA: Diagnosis not present

## 2021-10-04 DIAGNOSIS — R0602 Shortness of breath: Secondary | ICD-10-CM | POA: Diagnosis not present

## 2021-10-04 DIAGNOSIS — R911 Solitary pulmonary nodule: Secondary | ICD-10-CM | POA: Diagnosis not present

## 2021-10-10 ENCOUNTER — Other Ambulatory Visit (HOSPITAL_COMMUNITY): Payer: Self-pay

## 2021-10-17 DIAGNOSIS — H2511 Age-related nuclear cataract, right eye: Secondary | ICD-10-CM | POA: Diagnosis not present

## 2021-10-17 DIAGNOSIS — H18413 Arcus senilis, bilateral: Secondary | ICD-10-CM | POA: Diagnosis not present

## 2021-10-17 DIAGNOSIS — H25013 Cortical age-related cataract, bilateral: Secondary | ICD-10-CM | POA: Diagnosis not present

## 2021-10-17 DIAGNOSIS — H2513 Age-related nuclear cataract, bilateral: Secondary | ICD-10-CM | POA: Diagnosis not present

## 2021-10-17 DIAGNOSIS — H25043 Posterior subcapsular polar age-related cataract, bilateral: Secondary | ICD-10-CM | POA: Diagnosis not present

## 2021-10-20 ENCOUNTER — Other Ambulatory Visit (HOSPITAL_COMMUNITY): Payer: Self-pay

## 2021-10-23 ENCOUNTER — Ambulatory Visit (INDEPENDENT_AMBULATORY_CARE_PROVIDER_SITE_OTHER): Payer: 59 | Admitting: Acute Care

## 2021-10-23 ENCOUNTER — Encounter: Payer: Self-pay | Admitting: Acute Care

## 2021-10-23 ENCOUNTER — Other Ambulatory Visit: Payer: Self-pay

## 2021-10-23 VITALS — BP 142/82 | HR 85 | Temp 98.1°F | Ht 64.5 in | Wt 264.4 lb

## 2021-10-23 DIAGNOSIS — J069 Acute upper respiratory infection, unspecified: Secondary | ICD-10-CM

## 2021-10-23 NOTE — Progress Notes (Signed)
History of Present Illness Katelyn [name] is a 66 y.o. female never smoker  Past medical history significant for laparoscopic vaginal hysterectomy.  Patient of Dr. Chase Caller, seen for initial consult on 04/07/2021 for cough.   10/23/2021 Pt. Presents for follow up. She was seen 10/02/2021 for cough with wheezing and viral symptoms after sick exposures. CXR at the time showed early edema vs atypical infection. She was started on a prednisone taper, Albuterol nebs and Augmentin in addition to  hydromet cough syrup. She presents today for evaluation of treatment. Pt. States she has been doing better since treatment. She states she completed the Augmentin and prednisone taper. She has been using the albuterol nebs. She is better. She feels she has returned to baseline. She feels her voice is getting back to her baseline strength. She will have follow up HRCT in 04/2022 with follow up after.   Test Results: CXR 10/02/2021>> Acute visit Interlobular septal thickening suggests early edema, though atypical infection could have this appearance  05/03/2021 HRCT Chest Mild patchy subpleural reticulation and ground-glass opacity in both lungs, asymmetrically prominent in the left lung. No traction bronchiectasis or frank honeycombing. Findings could be due to nonspecific postinfectious/postinflammatory scarring, with an interstitial lung disease such as nonspecific interstitial pneumonia (NSIP) or early usual interstitial pneumonia   04/2021 Labs : Previous visit for ? ILD vs auto immune work up. Serology (including rheumatoid factor, ANA, scleroderma, TB, Sjogrens were negative) Eosinophils and IgE were somewhat elevated.   CBC Latest Ref Rng & Units 04/07/2021 10/29/2019 02/27/2018  WBC 4.0 - 10.5 K/uL 6.8 8.8 11.0(H)  Hemoglobin 12.0 - 15.0 g/dL 12.4 13.2 11.5(L)  Hematocrit 36.0 - 46.0 % 36.9 41.9 35.6(L)  Platelets 150.0 - 400.0 K/uL 302.0 290 217    BMP Latest Ref Rng & Units 10/29/2019 02/27/2018  02/17/2018  Glucose 70 - 99 mg/dL 122(H) 109(H) 111(H)  BUN 8 - 23 mg/dL 9 8 17   Creatinine 0.44 - 1.00 mg/dL 0.68 0.73 0.98  Sodium 135 - 145 mmol/L 139 141 137  Potassium 3.5 - 5.1 mmol/L 4.3 4.3 4.4  Chloride 98 - 111 mmol/L 104 105 105  CO2 22 - 32 mmol/L 26 27 22   Calcium 8.9 - 10.3 mg/dL 8.7(L) 8.4(L) 9.0    BNP No results found for: BNP  ProBNP No results found for: PROBNP  PFT No results found for: FEV1PRE, FEV1POST, FVCPRE, FVCPOST, TLC, DLCOUNC, PREFEV1FVCRT, PSTFEV1FVCRT  DG Chest 2 View  Result Date: 10/02/2021 CLINICAL DATA:  66 year old female with cough and congestion EXAM: CHEST - 2 VIEW COMPARISON:  None. FINDINGS: Cardiomediastinal silhouette within normal limits in size and contour. Interlobular septal thickening. No pleural effusion or confluent airspace disease. No pneumothorax or pleural effusion. No acute displaced fracture. Degenerative changes of the spine. IMPRESSION: Interlobular septal thickening suggests early edema, though atypical infection could have this appearance Electronically Signed   By: Corrie Mckusick D.O.   On: 10/02/2021 14:56     Past medical hx Past Medical History:  Diagnosis Date   Cancer ([place])    thyroid cancer   Hypertension    Hypothyroidism    PONV (postoperative nausea and vomiting)    Pre-diabetes    S/P laparoscopic assisted vaginal hysterectomy (LAVH) 02/26/2018   Seasonal allergies    SVD (spontaneous vaginal delivery)    x 2     Social History   Tobacco Use   Smoking status: Never   Smokeless tobacco: Never  Vaping Use   Vaping Use: Never used  Substance Use Topics   Alcohol use: Never   Drug use: Never    [name] reports that she has never smoked. She has never used smokeless tobacco. She reports that she does not drink alcohol and does not use drugs.  Tobacco Cessation: Never smoker   Past surgical hx, Family hx, Social hx all reviewed.  Current Outpatient Medications on File Prior to Visit  Medication  Sig   albuterol (PROVENTIL) (2.5 MG/3ML) 0.083% nebulizer solution Take 3 mLs (2.5 mg total) by nebulization every 6 (six) hours as needed for wheezing or shortness of breath.   albuterol (VENTOLIN HFA) 108 (90 Base) MCG/ACT inhaler Inhale 1 puff into the lungs every 4 (four) hours as needed.   albuterol (VENTOLIN HFA) 108 (90 Base) MCG/ACT inhaler Inhale 1 puff into the lungs every 4 (four) hours as needed.   amoxicillin-clavulanate (AUGMENTIN) 875-125 MG tablet Take 1 tablet by mouth 2 (two) times daily.   aspirin EC 81 MG tablet Take 81 mg by mouth daily.   atorvastatin (LIPITOR) 10 MG tablet Take 1 tablet (10 mg total) by mouth at bedtime.   benzonatate (TESSALON PERLES) 100 MG capsule Take 1 capsule (100 mg total) by mouth 3 (three) times daily as needed.   benzonatate (TESSALON) 100 MG capsule Take 1-2 capsules (100-200 mg total) by mouth at bedtime as needed for cough.   benzonatate (TESSALON) 200 MG capsule Take 1 capsule (200 mg total) by mouth 3 (three) times daily.   bisoprolol (ZEBETA) 5 MG tablet Take 1 tablet (5 mg total) by mouth daily.   Calcium Carb-Cholecalciferol (CALCIUM+D3 PO) Take 1 tablet by mouth at bedtime.   Cholecalciferol (VITAMIN D3 PO) Take 1 tablet by mouth 2 (two) times a week.   Cyanocobalamin (VITAMIN B-12 PO) Take 1 tablet by mouth at bedtime.   escitalopram (LEXAPRO) 10 MG tablet Take 1 tablet (10 mg total) by mouth daily.   fluticasone (FLONASE) 50 MCG/ACT nasal spray Place 1 spray into both nostrils daily.   fluticasone (FLOVENT HFA) 110 MCG/ACT inhaler Inhale 2 puffs into the lungs 2 (two) times daily.   HYDROcodone bit-homatropine (HYDROMET) 5-1.5 MG/5ML syrup Take 5 mLs by mouth at bedtime as needed for cough.   ibuprofen (ADVIL,MOTRIN) 800 MG tablet Take 1 tablet (800 mg total) by mouth every 8 (eight) hours as needed (mild pain).   Insulin Pen Needle 31G X 5 MM MISC Use as directed with Saxenda   levothyroxine (SYNTHROID) 112 MCG tablet Take 1 tablet  (112 mcg total) by mouth daily in the morning on an empty stomach.   levothyroxine (SYNTHROID) 112 MCG tablet Take 1 tablet (112 mcg total) by mouth in the morning on an empty stomach once daily   Liraglutide -Weight Management (SAXENDA) 18 MG/3ML SOPN Inject 0.6mg  into the skin daily for 1 week, then 1.2mg  daily for 1 week, then 1.8mg  daily for 1 week, then 2.4mg  daily for 1 week, then 3mg  daily   losartan-hydrochlorothiazide (HYZAAR) 100-12.5 MG tablet TAKE 1 TABLET BY MOUTH ONCE A DAY   losartan-hydrochlorothiazide (HYZAAR) 100-12.5 MG tablet Take 1 tablet by mouth daily.   meclizine (ANTIVERT) 25 MG tablet Take 1 tablet (25 mg total) by mouth 3 (three) times daily as needed for dizziness.   metFORMIN (GLUCOPHAGE-XR) 500 MG 24 hr tablet Take 1,000 mg by mouth 2 (two) times daily.   metFORMIN (GLUCOPHAGE-XR) 500 MG 24 hr tablet TAKE 4 TABLETS BY MOUTH ONCE A DAY   montelukast (SINGULAIR) 10 MG tablet Take 1 tablet (10  mg total) by mouth at bedtime.   omeprazole (PRILOSEC) 20 MG capsule Take 1 capsule (20 mg total) by mouth daily.   predniSONE (DELTASONE) 10 MG tablet Take 4 tablets by mouth daily for 2 days then 3 tablets daily for 2 days then 2 tablets daily for 2 days then 1 tablet daily for 2 days then stop   Semaglutide-Weight Management (WEGOVY) 0.25 MG/0.5ML SOAJ Inject 0.25 mg into the skin once a week.   Semaglutide-Weight Management (WEGOVY) 0.25 MG/0.5ML SOAJ Inject 0.25 mg into the skin once a week.   traMADol (ULTRAM) 50 MG tablet Take 1 tablet (50 mg total) by mouth every 6 (six) hours as needed for moderate pain.   escitalopram (LEXAPRO) 10 MG tablet TAKE 1 TABLET BY MOUTH ONCE DAILY   levothyroxine (SYNTHROID) 112 MCG tablet TAKE 1 TABLET BY MOUTH ON AN EMPTY STOMACH DAILY IN THE MORNING   No current facility-administered medications on file prior to visit.     Allergies  Allergen Reactions   Lisinopril Cough   Sulfa Antibiotics Rash    Review Of Systems:  Constitutional:    No  weight loss, night sweats,  Fevers, chills, fatigue, or  lassitude.  HEENT:   No headaches,  Difficulty swallowing,  Tooth/dental problems, or  Sore throat,                No sneezing, itching, ear ache, nasal congestion, post nasal drip,   CV:  No chest pain,  Orthopnea, PND, swelling in lower extremities, anasarca, dizziness, palpitations, syncope.   GI  No heartburn, indigestion, abdominal pain, nausea, vomiting, diarrhea, change in bowel habits, loss of appetite, bloody stools.   Resp: No shortness of breath with exertion or at rest.  No excess mucus, no productive cough,  No non-productive cough,  No coughing up of blood.  No change in color of mucus.  No wheezing.  No chest wall deformity  Skin: no rash or lesions.  GU: no dysuria, change in color of urine, no urgency or frequency.  No flank pain, no hematuria   MS:  No joint pain or swelling.  No decreased range of motion.  No back pain.  Psych:  No change in mood or affect. No depression or anxiety.  No memory loss.   Vital Signs BP (!) 142/82 (BP Location: Right Arm, Cuff Size: Normal)    Pulse 85    Temp 98.1 F (36.7 C) (Oral)    Ht 5' 4.5" (1.638 m)    Wt 264 lb 6.4 oz (119.9 kg)    LMP  (LMP Unknown)    SpO2 96%    BMI 44.68 kg/m    Physical Exam:  General- No distress,  A&Ox3, pleasant ENT: No sinus tenderness, TM clear, pale nasal mucosa, no oral exudate,no post nasal drip, no LAN Cardiac: S1, S2, regular rate and rhythm, no murmur Chest: No wheeze/ rales/ dullness; no accessory muscle use, no nasal flaring, no sternal retractions Abd.: Soft Non-tender, ND, BS +, Body mass index is 44.68 kg/m.  Ext: No clubbing cyanosis, edema Neuro:  normal strength, MAE x 4, A&O x 3, appropriate Skin: No rashes, warm and dry, No lesions  Psych: normal mood and behavior   Assessment/Plan Resolved Pneumonia vs viral infection  Plan I am so glad you are better. Continue Singulair daily  Will need follow up HRCT 04/2022  based on Dr. Golden Pop previous recommendations.  Sips of water instead of throat clearing for cough Sugar Free [employer] or [employer]  originals for throat soothing. Delsym Cough syrup 5 cc's every 12 hours Non-sedating antihistamine of your choice daily ( Zyrtec, Allegra, Xyzol, Claritin ( Generic ok)  Continue Omeprazole 20mg  once daily before first meal  Start Singulair 10mg  at bedtime, allergic component could be contributing to cough Follow up 04/2022 after HRCT Chest with Eustaquio Maize NP, Keegan Bensch NP, or Dr. Chase Caller Please contact office for sooner follow up if symptoms do not improve or worsen or seek emergency care   I spent 35  minutes dedicated to the care of this patient on the date of this encounter to include pre-visit review of records, face-to-face time with the patient discussing conditions above, post visit ordering of testing, clinical documentation with the electronic health record, making appropriate referrals as documented, and communicating necessary information to the patient's healthcare team.      Magdalen Spatz, NP 10/23/2021  12:27 PM

## 2021-10-23 NOTE — Patient Instructions (Addendum)
It is good to see you today I am so glad you are better. Continue Singulair daily  Will need follow up HRCT 04/2022 based on Dr. Golden Pop previous recommendations.  Sips of water instead of throat clearing for cough Sugar Free [employer] or [employer] originals for throat soothing. Delsym Cough syrup 5 cc's every 12 hours Non-sedating antihistamine of your choice daily ( Zyrtec, Allegra, Xyzol, Claritin ( Generic ok)  Continue Omeprazole 20mg  once daily before first meal  Start Singulair 10mg  at bedtime, allergic component could be contributing to cough Follow up 04/2022 after HRCT Chest with Eustaquio Maize NP, Aleighna Wojtas NP, or Dr. Chase Caller Please contact office for sooner follow up if symptoms do not improve or worsen or seek emergency care

## 2021-11-09 ENCOUNTER — Telehealth: Payer: Self-pay | Admitting: Acute Care

## 2021-11-09 NOTE — Telephone Encounter (Signed)
Office patient of Dr. Chase Caller , will send to him for recommendations

## 2021-11-09 NOTE — Telephone Encounter (Signed)
Primary Pulmonologist: ? Last office visit and with whom: 10/23/21 with Judson Roch What do we see them for (pulmonary problems): cough and URI Last OV assessment/plan: Assessment/Plan Resolved Pneumonia vs viral infection  Plan I am so glad you are better. Continue Singulair daily  Will need follow up HRCT 04/2022 based on Dr. Golden Pop previous recommendations.  Sips of water instead of throat clearing for cough Sugar Free [employer] or [employer] originals for throat soothing. Delsym Cough syrup 5 cc's every 12 hours Non-sedating antihistamine of your choice daily ( Zyrtec, Allegra, Xyzol, Claritin ( Generic ok)  Continue Omeprazole 73m once daily before first meal  Start Singulair 137mat bedtime, allergic component could be contributing to cough Follow up 04/2022 after HRCT Chest with BeEustaquio MaizeP, Sarah NP, or Dr. RaChase Callerlease contact office for sooner follow up if symptoms do not improve or worsen or seek emergency care   Was appointment offered to patient (explain)?  Patient wanted recommendations first.    Reason for call: Called and spoke with patient. She was last seen by SG on 10/23/21. She had finished the amoxicillin and prednisone that had been sent in back in December and was feeling better at this appointment. She has noticed that the cough has increased over the past 3-4 days. The cough has been nonproductive. She also feels like she has some post nasal drip as well. She denied any fevers or body aches.   She has been using her albuterol HFA and nebulizer to help with the cough. She has been using her Flovent, Claritin, sinus rinses and Singulair as prescribed.   (examples of things to ask: : When did symptoms start? Fever? Cough? Productive? Color to sputum? More sputum than usual? Wheezing? Have you needed increased oxygen? Are you taking your respiratory medications? What over the counter measures have you tried?)  Allergies  Allergen Reactions   Lisinopril Cough    Sulfa Antibiotics Rash    Immunization History  Administered Date(s) Administered   Hepatitis B, adult 04/22/1985, 05/19/1985, 10/22/1985   Influenza Whole 07/16/2011, 07/23/2012, 08/03/2013, 07/06/2014   Influenza,inj,quad, With Preservative 07/25/2015   MMR 11/03/2003   PFIZER(Purple Top)SARS-COV-2 Vaccination 10/14/2019, 11/04/2019, 08/03/2020   Tdap 03/10/2008, 05/28/2016   Zoster, Live 10/22/2018, 03/18/2019     She feels like she is developing a sinus infection. She wants to know what else should she do.   [facility]   TP, can you please advise since SG is not available today? Thanks!

## 2021-11-10 ENCOUNTER — Other Ambulatory Visit (HOSPITAL_COMMUNITY): Payer: Self-pay

## 2021-11-10 MED ORDER — WEGOVY 0.25 MG/0.5ML ~~LOC~~ SOAJ
0.2500 mg | SUBCUTANEOUS | 0 refills | Status: DC
  Filled 2021-11-10: qty 2, 28d supply, fill #0

## 2021-11-14 ENCOUNTER — Other Ambulatory Visit: Payer: Self-pay | Admitting: Obstetrics and Gynecology

## 2021-11-14 DIAGNOSIS — Z1231 Encounter for screening mammogram for malignant neoplasm of breast: Secondary | ICD-10-CM

## 2021-11-15 ENCOUNTER — Other Ambulatory Visit (HOSPITAL_COMMUNITY): Payer: Self-pay

## 2021-11-15 MED ORDER — WEGOVY 0.5 MG/0.5ML ~~LOC~~ SOAJ
0.5000 mg | SUBCUTANEOUS | 0 refills | Status: DC
  Filled 2021-11-15: qty 2, 28d supply, fill #0

## 2021-11-16 ENCOUNTER — Other Ambulatory Visit (HOSPITAL_COMMUNITY): Payer: Self-pay

## 2021-11-16 MED ORDER — KETOROLAC TROMETHAMINE 0.5 % OP SOLN
1.0000 [drp] | Freq: Four times a day (QID) | OPHTHALMIC | 1 refills | Status: DC
  Filled 2021-11-16: qty 5, 25d supply, fill #0
  Filled 2022-01-02: qty 5, 25d supply, fill #1

## 2021-11-16 MED ORDER — PREDNISOLONE ACETATE 1 % OP SUSP
1.0000 [drp] | Freq: Four times a day (QID) | OPHTHALMIC | 1 refills | Status: DC
  Filled 2021-11-16: qty 5, 25d supply, fill #0
  Filled 2022-01-02: qty 5, 25d supply, fill #1

## 2021-11-16 MED ORDER — GATIFLOXACIN 0.5 % OP SOLN
1.0000 [drp] | Freq: Four times a day (QID) | OPHTHALMIC | 1 refills | Status: DC
  Filled 2021-11-16: qty 5, 25d supply, fill #0
  Filled 2022-01-02: qty 5, 25d supply, fill #1

## 2021-11-17 ENCOUNTER — Other Ambulatory Visit (HOSPITAL_COMMUNITY): Payer: Self-pay

## 2021-11-27 ENCOUNTER — Ambulatory Visit
Admission: RE | Admit: 2021-11-27 | Discharge: 2021-11-27 | Disposition: A | Discharge status: Home / Self Care | Payer: 59 | Source: Ambulatory Visit | Attending: Obstetrics and Gynecology | Admitting: Obstetrics and Gynecology

## 2021-11-27 DIAGNOSIS — Z1231 Encounter for screening mammogram for malignant neoplasm of breast: Secondary | ICD-10-CM

## 2021-11-29 ENCOUNTER — Other Ambulatory Visit (HOSPITAL_COMMUNITY): Payer: Self-pay

## 2021-11-30 ENCOUNTER — Other Ambulatory Visit (HOSPITAL_COMMUNITY): Payer: Self-pay

## 2021-12-04 ENCOUNTER — Other Ambulatory Visit (HOSPITAL_COMMUNITY): Payer: Self-pay

## 2021-12-04 ENCOUNTER — Other Ambulatory Visit: Payer: Self-pay | Admitting: Primary Care

## 2021-12-04 NOTE — Telephone Encounter (Signed)
Sorry missed this message. Does she still need intervention? Or is she better?

## 2021-12-04 NOTE — Telephone Encounter (Signed)
Called and spoke with patient. She stated that the cough is now gone. She used the saline rinses. She believes she had a sinus infection. She feels well now.   Nothing further needed at time of call.

## 2021-12-05 ENCOUNTER — Other Ambulatory Visit (HOSPITAL_COMMUNITY): Payer: Self-pay

## 2021-12-05 MED ORDER — WEGOVY 1 MG/0.5ML ~~LOC~~ SOAJ
1.0000 mg | SUBCUTANEOUS | 0 refills | Status: DC
  Filled 2021-12-05: qty 2, 28d supply, fill #0

## 2021-12-05 MED ORDER — MONTELUKAST SODIUM 10 MG PO TABS
10.0000 mg | ORAL_TABLET | Freq: Every day | ORAL | 5 refills | Status: DC
  Filled 2021-12-05: qty 90, 90d supply, fill #0
  Filled 2022-04-13: qty 90, 90d supply, fill #1

## 2021-12-20 ENCOUNTER — Other Ambulatory Visit (HOSPITAL_COMMUNITY): Payer: Self-pay

## 2021-12-25 DIAGNOSIS — J069 Acute upper respiratory infection, unspecified: Secondary | ICD-10-CM | POA: Diagnosis not present

## 2022-01-01 DIAGNOSIS — H2511 Age-related nuclear cataract, right eye: Secondary | ICD-10-CM | POA: Diagnosis not present

## 2022-01-01 DIAGNOSIS — H52201 Unspecified astigmatism, right eye: Secondary | ICD-10-CM | POA: Diagnosis not present

## 2022-01-02 ENCOUNTER — Other Ambulatory Visit (HOSPITAL_COMMUNITY): Payer: Self-pay

## 2022-01-05 ENCOUNTER — Other Ambulatory Visit (HOSPITAL_COMMUNITY): Payer: Self-pay

## 2022-01-09 ENCOUNTER — Other Ambulatory Visit (HOSPITAL_COMMUNITY): Payer: Self-pay

## 2022-01-09 MED ORDER — WEGOVY 1 MG/0.5ML ~~LOC~~ SOAJ
1.0000 mg | SUBCUTANEOUS | 5 refills | Status: DC
  Filled 2022-01-09: qty 2, 28d supply, fill #0

## 2022-01-09 MED ORDER — WEGOVY 1.7 MG/0.75ML ~~LOC~~ SOAJ
1.7000 mg | SUBCUTANEOUS | 0 refills | Status: DC
Start: 2022-01-09 — End: 2022-06-13
  Filled 2022-01-09: qty 3, 28d supply, fill #0

## 2022-01-17 DIAGNOSIS — E785 Hyperlipidemia, unspecified: Secondary | ICD-10-CM | POA: Diagnosis not present

## 2022-01-17 DIAGNOSIS — Z85850 Personal history of malignant neoplasm of thyroid: Secondary | ICD-10-CM | POA: Diagnosis not present

## 2022-01-17 DIAGNOSIS — Z7984 Long term (current) use of oral hypoglycemic drugs: Secondary | ICD-10-CM | POA: Diagnosis not present

## 2022-01-17 DIAGNOSIS — E890 Postprocedural hypothyroidism: Secondary | ICD-10-CM | POA: Diagnosis not present

## 2022-01-17 DIAGNOSIS — R7303 Prediabetes: Secondary | ICD-10-CM | POA: Diagnosis not present

## 2022-01-22 ENCOUNTER — Other Ambulatory Visit (HOSPITAL_COMMUNITY): Payer: Self-pay

## 2022-01-25 ENCOUNTER — Other Ambulatory Visit (HOSPITAL_COMMUNITY): Payer: Self-pay

## 2022-01-25 MED ORDER — ESCITALOPRAM OXALATE 10 MG PO TABS
10.0000 mg | ORAL_TABLET | Freq: Every day | ORAL | 0 refills | Status: DC
  Filled 2022-01-25: qty 60, 60d supply, fill #0

## 2022-01-26 ENCOUNTER — Other Ambulatory Visit (HOSPITAL_COMMUNITY): Payer: Self-pay

## 2022-01-26 MED ORDER — WEGOVY 2.4 MG/0.75ML ~~LOC~~ SOAJ
2.4000 mg | SUBCUTANEOUS | 11 refills | Status: DC
Start: 2022-01-26 — End: 2022-12-10
  Filled 2022-01-26 – 2022-01-30 (×7): qty 3, 28d supply, fill #0
  Filled 2022-02-12 – 2022-02-26 (×2): qty 3, 28d supply, fill #1
  Filled 2022-03-23: qty 3, 28d supply, fill #2
  Filled 2022-04-18: qty 3, 28d supply, fill #3
  Filled 2022-05-19: qty 3, 28d supply, fill #4
  Filled 2022-06-15: qty 3, 28d supply, fill #5
  Filled 2022-07-13: qty 3, 28d supply, fill #6
  Filled 2022-08-09: qty 3, 28d supply, fill #7
  Filled 2022-09-09: qty 3, 28d supply, fill #8
  Filled 2022-09-24 – 2022-10-02 (×2): qty 3, 28d supply, fill #9
  Filled 2022-11-12: qty 3, 28d supply, fill #10
  Filled 2022-12-07: qty 3, 28d supply, fill #11

## 2022-01-30 ENCOUNTER — Other Ambulatory Visit (HOSPITAL_COMMUNITY): Payer: Self-pay

## 2022-01-30 DIAGNOSIS — R7303 Prediabetes: Secondary | ICD-10-CM | POA: Diagnosis not present

## 2022-01-30 DIAGNOSIS — E785 Hyperlipidemia, unspecified: Secondary | ICD-10-CM | POA: Diagnosis not present

## 2022-01-30 DIAGNOSIS — E890 Postprocedural hypothyroidism: Secondary | ICD-10-CM | POA: Diagnosis not present

## 2022-01-30 DIAGNOSIS — Z85850 Personal history of malignant neoplasm of thyroid: Secondary | ICD-10-CM | POA: Diagnosis not present

## 2022-01-31 ENCOUNTER — Other Ambulatory Visit (HOSPITAL_COMMUNITY): Payer: Self-pay

## 2022-02-12 ENCOUNTER — Other Ambulatory Visit (HOSPITAL_COMMUNITY): Payer: Self-pay

## 2022-02-13 ENCOUNTER — Other Ambulatory Visit (HOSPITAL_COMMUNITY): Payer: Self-pay

## 2022-02-14 ENCOUNTER — Other Ambulatory Visit (HOSPITAL_COMMUNITY): Payer: Self-pay

## 2022-02-14 MED ORDER — METFORMIN HCL ER 500 MG PO TB24
2000.0000 mg | ORAL_TABLET | Freq: Every day | ORAL | 3 refills | Status: DC
  Filled 2022-02-14: qty 360, 90d supply, fill #0
  Filled 2022-05-22: qty 360, 90d supply, fill #1
  Filled 2022-10-02: qty 360, 90d supply, fill #2
  Filled 2023-01-16: qty 360, 90d supply, fill #3

## 2022-02-15 ENCOUNTER — Other Ambulatory Visit (HOSPITAL_COMMUNITY): Payer: Self-pay

## 2022-02-19 ENCOUNTER — Other Ambulatory Visit (HOSPITAL_COMMUNITY): Payer: Self-pay

## 2022-02-26 ENCOUNTER — Other Ambulatory Visit (HOSPITAL_COMMUNITY): Payer: Self-pay

## 2022-02-28 ENCOUNTER — Other Ambulatory Visit (HOSPITAL_COMMUNITY): Payer: Self-pay

## 2022-03-23 ENCOUNTER — Other Ambulatory Visit (HOSPITAL_COMMUNITY): Payer: Self-pay

## 2022-03-23 MED ORDER — ATORVASTATIN CALCIUM 10 MG PO TABS
10.0000 mg | ORAL_TABLET | Freq: Every day | ORAL | 3 refills | Status: DC
  Filled 2022-03-23: qty 90, 90d supply, fill #0
  Filled 2022-06-26: qty 90, 90d supply, fill #1
  Filled 2022-07-13 – 2022-09-24 (×2): qty 90, 90d supply, fill #2

## 2022-03-23 MED ORDER — ESCITALOPRAM OXALATE 10 MG PO TABS
10.0000 mg | ORAL_TABLET | Freq: Every day | ORAL | 0 refills | Status: DC
  Filled 2022-03-23: qty 60, 60d supply, fill #0

## 2022-04-13 ENCOUNTER — Other Ambulatory Visit (HOSPITAL_COMMUNITY): Payer: Self-pay

## 2022-04-14 ENCOUNTER — Other Ambulatory Visit (HOSPITAL_COMMUNITY): Payer: Self-pay

## 2022-04-16 ENCOUNTER — Other Ambulatory Visit (HOSPITAL_COMMUNITY): Payer: Self-pay

## 2022-04-16 MED ORDER — LOSARTAN POTASSIUM-HCTZ 100-12.5 MG PO TABS
1.0000 | ORAL_TABLET | Freq: Every day | ORAL | 1 refills | Status: DC
  Filled 2022-04-16: qty 90, 90d supply, fill #0

## 2022-04-18 ENCOUNTER — Other Ambulatory Visit (HOSPITAL_COMMUNITY): Payer: Self-pay

## 2022-04-24 DIAGNOSIS — R7303 Prediabetes: Secondary | ICD-10-CM | POA: Diagnosis not present

## 2022-04-24 DIAGNOSIS — E890 Postprocedural hypothyroidism: Secondary | ICD-10-CM | POA: Diagnosis not present

## 2022-05-07 ENCOUNTER — Ambulatory Visit (HOSPITAL_BASED_OUTPATIENT_CLINIC_OR_DEPARTMENT_OTHER)
Admission: RE | Admit: 2022-05-07 | Discharge: 2022-05-07 | Disposition: A | Discharge status: Home / Self Care | Payer: 59 | Source: Ambulatory Visit | Attending: Primary Care | Admitting: Primary Care

## 2022-05-07 DIAGNOSIS — I272 Pulmonary hypertension, unspecified: Secondary | ICD-10-CM | POA: Diagnosis not present

## 2022-05-07 DIAGNOSIS — R059 Cough, unspecified: Secondary | ICD-10-CM

## 2022-05-07 DIAGNOSIS — J9809 Other diseases of bronchus, not elsewhere classified: Secondary | ICD-10-CM | POA: Diagnosis not present

## 2022-05-07 DIAGNOSIS — R918 Other nonspecific abnormal finding of lung field: Secondary | ICD-10-CM | POA: Diagnosis not present

## 2022-05-09 ENCOUNTER — Other Ambulatory Visit (HOSPITAL_BASED_OUTPATIENT_CLINIC_OR_DEPARTMENT_OTHER): Payer: 59

## 2022-05-10 DIAGNOSIS — E785 Hyperlipidemia, unspecified: Secondary | ICD-10-CM | POA: Diagnosis not present

## 2022-05-10 DIAGNOSIS — R7303 Prediabetes: Secondary | ICD-10-CM | POA: Diagnosis not present

## 2022-05-10 DIAGNOSIS — F439 Reaction to severe stress, unspecified: Secondary | ICD-10-CM | POA: Diagnosis not present

## 2022-05-10 DIAGNOSIS — Z8639 Personal history of other endocrine, nutritional and metabolic disease: Secondary | ICD-10-CM | POA: Diagnosis not present

## 2022-05-10 DIAGNOSIS — E559 Vitamin D deficiency, unspecified: Secondary | ICD-10-CM | POA: Diagnosis not present

## 2022-05-10 DIAGNOSIS — E890 Postprocedural hypothyroidism: Secondary | ICD-10-CM | POA: Diagnosis not present

## 2022-05-10 DIAGNOSIS — Z85850 Personal history of malignant neoplasm of thyroid: Secondary | ICD-10-CM | POA: Diagnosis not present

## 2022-05-10 DIAGNOSIS — I10 Essential (primary) hypertension: Secondary | ICD-10-CM | POA: Diagnosis not present

## 2022-05-10 NOTE — Progress Notes (Signed)
Please let patient know pulmonary nodule in left lower lobe of her lung is stable. She does have some mild opacities to lung bases, likely from a prior infection/or inflammation. Not worsening since last CT scan. Can not rule out early fibrosis, needs to continue annual surveillance to monitor for changes. Please order repeat HCT for 1 year  She had additional findings of coronary artery disease and enlargement of pulmonary artery. She had an echocardiogram last year that looked normal, however, they were not able to assess pulmonary artery pressure. If she is having any new or worsening sob should be referred to cardiology for possible heart cath. Make sure she had as follow-up with Dr. Chase Caller in the next 2-3 months.

## 2022-05-14 ENCOUNTER — Telehealth: Payer: Self-pay | Admitting: Internal Medicine

## 2022-05-14 DIAGNOSIS — I272 Pulmonary hypertension, unspecified: Secondary | ICD-10-CM

## 2022-05-14 DIAGNOSIS — R911 Solitary pulmonary nodule: Secondary | ICD-10-CM

## 2022-05-14 NOTE — Telephone Encounter (Signed)
Called patient and went over results and got her new CT ordered for next year. Nothing further needed

## 2022-05-21 ENCOUNTER — Other Ambulatory Visit (HOSPITAL_COMMUNITY): Payer: Self-pay

## 2022-05-22 ENCOUNTER — Other Ambulatory Visit (HOSPITAL_COMMUNITY): Payer: Self-pay

## 2022-05-29 ENCOUNTER — Other Ambulatory Visit (HOSPITAL_COMMUNITY): Payer: Self-pay

## 2022-05-29 MED ORDER — ESCITALOPRAM OXALATE 10 MG PO TABS
10.0000 mg | ORAL_TABLET | Freq: Every day | ORAL | 0 refills | Status: DC
Start: 2022-05-29 — End: 2022-07-31
  Filled 2022-05-29: qty 60, 60d supply, fill #0

## 2022-05-29 MED ORDER — LEVOTHYROXINE SODIUM 112 MCG PO TABS
112.0000 ug | ORAL_TABLET | Freq: Every morning | ORAL | 3 refills | Status: DC
  Filled 2022-05-29: qty 90, 90d supply, fill #0

## 2022-06-12 NOTE — Progress Notes (Unsigned)
Cardiology Office Note:    Date:  06/13/2022   ID:  Katelyn [name], DOB 05/03/1956, MRN [id number]  PCP:  Lyman Bishop, DO  Cardiologist:  None   Referring MD: Martyn Ehrich, NP   Chief Complaint  Patient presents with   Coronary Artery Disease   Follow-up    Possible pulmonary hypertension    History of Present Illness:    Katelyn [name] is a 66 y.o. female with a hx of coronary calcification, enlarged pulmonary artery on CT scan, family history of coronary disease, prediabetes, hypertension, and hyperlipidemia.  Referred because of concern for pulmonary hypertension.  Patient does not complain of peripheral edema, syncope, chest pain, cough, or other symptoms that were ordered nearly be associated with pulmonary hypertension.  Coincidental finding on CT scan was coronary calcification.  No symptoms of angina exists.  Her brother did have coronary disease but also had an aortic dissection.  Past Medical History:  Diagnosis Date   Cancer ([place])    thyroid cancer   Hypertension    Hypothyroidism    PONV (postoperative nausea and vomiting)    Pre-diabetes    S/P laparoscopic assisted vaginal hysterectomy (LAVH) 02/26/2018   Seasonal allergies    SVD (spontaneous vaginal delivery)    x 2    Past Surgical History:  Procedure Laterality Date   ANTERIOR AND POSTERIOR REPAIR N/A 02/26/2018   Procedure: ANTERIOR (CYSTOCELE) AND POSTERIOR REPAIR (RECTOCELE);  Surgeon: Janyth Contes, MD;  Location: [facility];  Service: Gynecology;  Laterality: N/A;   COLONOSCOPY     LAPAROSCOPIC VAGINAL HYSTERECTOMY WITH SALPINGECTOMY Bilateral 02/26/2018   Procedure: LAPAROSCOPIC ASSISTED VAGINAL HYSTERECTOMY WITH SALPINGECTOMY;  Surgeon: Janyth Contes, MD;  Location: [facility];  Service: Gynecology;  Laterality: Bilateral;   TONSILLECTOMY     TOTAL THYROIDECTOMY     in [place]   WISDOM TOOTH EXTRACTION      Current Medications: Current Meds  Medication Sig   albuterol (VENTOLIN  HFA) 108 (90 Base) MCG/ACT inhaler Inhale 1 puff into the lungs every 4 (four) hours as needed.   atorvastatin (LIPITOR) 10 MG tablet Take 1 tablet (10 mg total) by mouth at bedtime.   Calcium Carb-Cholecalciferol (CALCIUM+D3 PO) Take 1 tablet by mouth at bedtime.   Cholecalciferol (VITAMIN D3 PO) Take 1 tablet by mouth 2 (two) times a week.   escitalopram (LEXAPRO) 10 MG tablet Take 1 tablet (10 mg total) by mouth daily.   levothyroxine (SYNTHROID) 112 MCG tablet Take 1 tablet (112 mcg total) by mouth in the morning on an empty stomach once daily   metFORMIN (GLUCOPHAGE-XR) 500 MG 24 hr tablet Take 4 tablets (2,000 mg total) by mouth daily.   metoprolol tartrate (LOPRESSOR) 100 MG tablet Take 1 tablet (100 mg total) by mouth once for 1 dose. Take 90-120 minutes prior to scan.   montelukast (SINGULAIR) 10 MG tablet Take 1 tablet (10 mg total) by mouth at bedtime.   omeprazole (PRILOSEC) 20 MG capsule Take 1 capsule (20 mg total) by mouth daily.   Semaglutide-Weight Management (WEGOVY) 2.4 MG/0.75ML SOAJ Inject 2.4 mg into the skin once a week.   [DISCONTINUED] albuterol (VENTOLIN HFA) 108 (90 Base) MCG/ACT inhaler Inhale 1 puff into the lungs every 4 (four) hours as needed.   [DISCONTINUED] aspirin EC 81 MG tablet Take 81 mg by mouth daily.   [DISCONTINUED] benzonatate (TESSALON PERLES) 100 MG capsule Take 1 capsule (100 mg total) by mouth 3 (three) times daily as needed.   [DISCONTINUED] Cyanocobalamin (  VITAMIN B-12 PO) Take 1 tablet by mouth at bedtime.   [DISCONTINUED] fluticasone (FLONASE) 50 MCG/ACT nasal spray Place 1 spray into both nostrils daily.   [DISCONTINUED] fluticasone (FLOVENT HFA) 110 MCG/ACT inhaler Inhale 2 puffs into the lungs 2 (two) times daily.   [DISCONTINUED] gatifloxacin (ZYMAXID) 0.5 % SOLN Place 1 drop into the right eye 4 (four) times daily as directed   [DISCONTINUED] ibuprofen (ADVIL,MOTRIN) 800 MG tablet Take 1 tablet (800 mg total) by mouth every 8 (eight) hours as  needed (mild pain).   [DISCONTINUED] levothyroxine (SYNTHROID) 112 MCG tablet Take 1 tablet (112 mcg total) by mouth in the morning on an empty stomach   [DISCONTINUED] losartan-hydrochlorothiazide (HYZAAR) 100-12.5 MG tablet Take 1 tablet by mouth daily.   [DISCONTINUED] metFORMIN (GLUCOPHAGE-XR) 500 MG 24 hr tablet Take 1,000 mg by mouth 2 (two) times daily.   [DISCONTINUED] prednisoLONE acetate (PRED FORTE) 1 % ophthalmic suspension Place 1 drop into the right eye 4 (four) times daily as directed. Shake well before each use   [DISCONTINUED] Semaglutide-Weight Management (WEGOVY) 0.5 MG/0.5ML SOAJ Inject 0.5 mg into the skin once a week.   [DISCONTINUED] Semaglutide-Weight Management (WEGOVY) 1.7 MG/0.75ML SOAJ Inject 1.7 mg into the skin once a week.     Allergies:   Lisinopril and Sulfa antibiotics   Social History   Socioeconomic History   Marital status: Widowed    Spouse name: Not on file   Number of children: Not on file   Years of education: Not on file   Highest education level: Not on file  Occupational History   Not on file  Tobacco Use   Smoking status: Never   Smokeless tobacco: Never  Vaping Use   Vaping Use: Never used  Substance and Sexual Activity   Alcohol use: Never   Drug use: Never   Sexual activity: Yes    Birth control/protection: Post-menopausal  Other Topics Concern   Not on file  Social History Narrative   Not on file   Social Determinants of Health   Financial Resource Strain: Not on file  Food Insecurity: Not on file  Transportation Needs: Not on file  Physical Activity: Not on file  Stress: Not on file  Social Connections: Not on file     Family History: The patient's family history includes Heart attack in her father and sister; Heart block in her mother.  ROS:   Please see the history of present illness.    No real cardiac complaints.  Overweight.  No evidence of pulmonary hypertension on echo done less than a year ago.  All other  systems reviewed and are negative.  EKGs/Labs/Other Studies Reviewed:    The following studies were reviewed today:  2D Doppler echocardiogram performed 05/18/2021: IMPRESSIONS   1. Left ventricular ejection fraction, by estimation, is 60 to 65%. The  left ventricle has normal function. The left ventricle has no regional  wall motion abnormalities. Left ventricular diastolic parameters were  normal.   2. Right ventricular systolic function is normal. The right ventricular  size is normal. Tricuspid regurgitation signal is inadequate for assessing  PA pressure.   3. The mitral valve is grossly normal. Trivial mitral valve  regurgitation. No evidence of mitral stenosis.   4. The aortic valve is grossly normal. Aortic valve regurgitation is not  visualized. No aortic stenosis is present.   5. The inferior vena cava is normal in size with greater than 50%  respiratory variability, suggesting right atrial pressure of 3 mmHg.  Comparison(s): No prior Echocardiogram.   Conclusion(s)/Recommendation(s): Normal biventricular function without  evidence of hemodynamically significant valvular heart disease.   Chest CT scan 05/07/2022: IMPRESSION: 1. Minimal, irregular ground-glass and interstitial opacity in the bilateral lung bases, again more notable on the left. Findings are nonspecific and generally favored to reflect sequelae of prior infection or aspiration, however minimal, early fibrotic interstitial lung disease is not strictly excluded. Consider ongoing annual surveillance if there is high, persistent clinical suspicion for fibrotic interstitial lung disease. Findings are indeterminate for UIP per consensus guidelines: Diagnosis of Idiopathic Pulmonary Fibrosis: An Official ATS/ERS/JRS/ALAT Clinical Practice Guideline. [place], Iss 5, [phone], Jun 15 2017. 2. Mild, diffuse bilateral bronchial wall thickening, consistent with nonspecific infectious or  inflammatory bronchitis. 3. Enlargement of the main pulmonary artery, as can be seen in pulmonary hypertension. 4. Coronary artery disease.  EKG:  EKG left axis deviation, left anterior hemiblock, left atrial abnormality, incomplete right bundle.  And since 2021, the incomplete right bundle and left anterior hemiblock are new.  Recent Labs: No results found for requested labs within last 365 days.  Recent Lipid Panel No results found for: "CHOL", "TRIG", "HDL", "CHOLHDL", "VLDL", "LDLCALC", "LDLDIRECT"  Physical Exam:    VS:  BP 122/80   Pulse 81   Ht 5' 4.5" (1.638 m)   Wt 231 lb 6.4 oz (105 kg)   LMP  (LMP Unknown)   SpO2 98%   BMI 39.11 kg/m     Wt Readings from Last 3 Encounters:  06/13/22 231 lb 6.4 oz (105 kg)  10/23/21 264 lb 6.4 oz (119.9 kg)  10/02/21 261 lb (118.4 kg)     GEN: Obese. No acute distress HEENT: Normal NECK: No JVD. LYMPHATICS: No lymphadenopathy CARDIAC: No murmur. RRR no gallop, or edema. VASCULAR:  Normal Pulses. No bruits. RESPIRATORY:  Clear to auscultation without rales, wheezing or rhonchi  ABDOMEN: Soft, non-tender, non-distended, No pulsatile mass, MUSCULOSKELETAL: No deformity  SKIN: Warm and dry NEUROLOGIC:  Alert and oriented x 3 PSYCHIATRIC:  Normal affect   ASSESSMENT:    1. Pulmonary HTN ([place])   2. Coronary artery disease involving native coronary artery of native heart without angina pectoris   3. Pre-procedure lab exam    PLAN:    In order of problems listed above:  I do not think she has significant pulmonary hypertension.  She does have a dilated pulmonary artery.  In absence of symptoms, edema, evidence of right heart enlargement on echocardiogram and with no symptoms to suggest a primary pulmonary issue, I would suggest conservative watchful waiting. We will do coronary CTA with FFR if indicated given family history of CAD.  Overall education and awareness concerning primary/secondary risk prevention was discussed in  detail: LDL less than 70, hemoglobin A1c less than 7, blood pressure target less than 130/80 mmHg, >150 minutes of moderate aerobic activity per week, avoidance of smoking, weight control (via diet and exercise), and continued surveillance/management of/for obstructive sleep apnea.    Medication Adjustments/Labs and Tests Ordered: Current medicines are reviewed at length with the patient today.  Concerns regarding medicines are outlined above.  Orders Placed This Encounter  Procedures   CT CORONARY MORPH W/CTA COR W/SCORE W/CA W/CM &/OR WO/CM   Basic metabolic panel   EKG 62-IWLN   Meds ordered this encounter  Medications   metoprolol tartrate (LOPRESSOR) 100 MG tablet    Sig: Take 1 tablet (100 mg total) by mouth once for 1 dose. Take  90-120 minutes prior to scan.    Dispense:  1 tablet    Refill:  0    Patient Instructions  Medication Instructions:  Your physician recommends that you continue on your current medications as directed. Please refer to the Current Medication list given to you today.  *If you need a refill on your cardiac medications before your next appointment, please call your pharmacy*  Lab Work: TODAY: BMET If you have labs (blood work) drawn today and your tests are completely normal, you will receive your results only by: [place] (if you have MyChart) OR A paper copy in the mail If you have any lab test that is abnormal or we need to change your treatment, we will call you to review the results.  Testing/Procedures: Your physician has recommended you have a coronary CTA performed.  Follow-Up: Will be based on results of coronary CTA.  Other Instructions   Your cardiac CT will be scheduled at the below location:   [facility] [address] [place], [place] [address] [phone]  Please arrive at the [facility] and Children's Entrance (Entrance C2) of [facility] 30 minutes prior to test start time. You can use the FREE  valet parking offered at entrance C (encouraged to control the heart rate for the test)  Proceed to the [facility] Radiology Department (first floor) to check-in and test prep.  All radiology patients and guests should use entrance C2 at [facility], accessed from [facility], even though the hospital's physical address listed is [address].    Please follow these instructions carefully (unless otherwise directed):  On the Night Before the Test: Be sure to Drink plenty of water. Do not consume any caffeinated/decaffeinated beverages or chocolate 12 hours prior to your test. Do not take any antihistamines 12 hours prior to your test.  On the Day of the Test: Drink plenty of water until 1 hour prior to the test. Do not eat any food 4 hours prior to the test. You may take your regular medications prior to the test.  Take metoprolol (Lopressor) '100mg'$  two hours prior to test. FEMALES- please wear underwire-free bra if available, avoid dresses & tight clothing      After the Test: Drink plenty of water. After receiving IV contrast, you may experience a mild flushed feeling. This is normal. On occasion, you may experience a mild rash up to 24 hours after the test. This is not dangerous. If this occurs, you can take Benadryl 25 mg and increase your fluid intake. If you experience trouble breathing, this can be serious. If it is severe call 911 IMMEDIATELY. If it is mild, please call our office. If you take any of these medications: Glipizide/Metformin, Avandament, Glucavance, please do not take 48 hours after completing test unless otherwise instructed.  We will call to schedule your test 2-4 weeks out understanding that some insurance companies will need an authorization prior to the service being performed.   For non-scheduling related questions, please contact the cardiac imaging nurse navigator should you have any questions/concerns: Marchia Bond, Cardiac  Imaging Nurse Navigator Gordy Clement, Cardiac Imaging Nurse Navigator [facility] Direct Office Dial: [phone]   For scheduling needs, including cancellations and rescheduling, please call [place], [phone].   Important Information About Sugar         Signed, Sinclair Grooms, MD  06/13/2022 4:07 PM    [facility]

## 2022-06-13 ENCOUNTER — Encounter: Payer: Self-pay | Admitting: Interventional Cardiology

## 2022-06-13 ENCOUNTER — Ambulatory Visit: Payer: 59 | Attending: Interventional Cardiology | Admitting: Interventional Cardiology

## 2022-06-13 ENCOUNTER — Other Ambulatory Visit (HOSPITAL_COMMUNITY): Payer: Self-pay

## 2022-06-13 VITALS — BP 122/80 | HR 81 | Ht 64.5 in | Wt 231.4 lb

## 2022-06-13 DIAGNOSIS — I272 Pulmonary hypertension, unspecified: Secondary | ICD-10-CM | POA: Diagnosis not present

## 2022-06-13 DIAGNOSIS — Z01812 Encounter for preprocedural laboratory examination: Secondary | ICD-10-CM | POA: Diagnosis not present

## 2022-06-13 DIAGNOSIS — I2510 Atherosclerotic heart disease of native coronary artery without angina pectoris: Secondary | ICD-10-CM

## 2022-06-13 MED ORDER — METOPROLOL TARTRATE 100 MG PO TABS
100.0000 mg | ORAL_TABLET | Freq: Once | ORAL | 0 refills | Status: DC
  Filled 2022-06-13: qty 1, 1d supply, fill #0

## 2022-06-13 NOTE — Patient Instructions (Addendum)
Medication Instructions:  Your physician recommends that you continue on your current medications as directed. Please refer to the Current Medication list given to you today.  *If you need a refill on your cardiac medications before your next appointment, please call your pharmacy*  Lab Work: TODAY: BMET If you have labs (blood work) drawn today and your tests are completely normal, you will receive your results only by: [place] (if you have MyChart) OR A paper copy in the mail If you have any lab test that is abnormal or we need to change your treatment, we will call you to review the results.  Testing/Procedures: Your physician has recommended you have a coronary CTA performed.  Follow-Up: Will be based on results of coronary CTA.  Other Instructions   Your cardiac CT will be scheduled at the below location:   [facility] [address] [place], [place] [address] [phone]  Please arrive at the [facility] and Children's Entrance (Entrance C2) of [facility] 30 minutes prior to test start time. You can use the FREE valet parking offered at entrance C (encouraged to control the heart rate for the test)  Proceed to the [facility] Radiology Department (first floor) to check-in and test prep.  All radiology patients and guests should use entrance C2 at [facility], accessed from [facility], even though the hospital's physical address listed is [address]    Please follow these instructions carefully (unless otherwise directed):  On the Night Before the Test: Be sure to Drink plenty of water. Do not consume any caffeinated/decaffeinated beverages or chocolate 12 hours prior to your test. Do not take any antihistamines 12 hours prior to your test.  On the Day of the Test: Drink plenty of water until 1 hour prior to the test. Do not eat any food 4 hours prior to the test. You may take your regular medications  prior to the test.  Take metoprolol (Lopressor) '100mg'$  two hours prior to test. FEMALES- please wear underwire-free bra if available, avoid dresses & tight clothing      After the Test: Drink plenty of water. After receiving IV contrast, you may experience a mild flushed feeling. This is normal. On occasion, you may experience a mild rash up to 24 hours after the test. This is not dangerous. If this occurs, you can take Benadryl 25 mg and increase your fluid intake. If you experience trouble breathing, this can be serious. If it is severe call 911 IMMEDIATELY. If it is mild, please call our office. If you take any of these medications: Glipizide/Metformin, Avandament, Glucavance, please do not take 48 hours after completing test unless otherwise instructed.  We will call to schedule your test 2-4 weeks out understanding that some insurance companies will need an authorization prior to the service being performed.   For non-scheduling related questions, please contact the cardiac imaging nurse navigator should you have any questions/concerns: Marchia Bond, Cardiac Imaging Nurse Navigator Gordy Clement, Cardiac Imaging Nurse Navigator [facility] Direct Office Dial: [phone]   For scheduling needs, including cancellations and rescheduling, please call [place], [phone].   Important Information About Sugar

## 2022-06-14 DIAGNOSIS — Z961 Presence of intraocular lens: Secondary | ICD-10-CM | POA: Diagnosis not present

## 2022-06-14 DIAGNOSIS — H1132 Conjunctival hemorrhage, left eye: Secondary | ICD-10-CM | POA: Diagnosis not present

## 2022-06-14 DIAGNOSIS — H18413 Arcus senilis, bilateral: Secondary | ICD-10-CM | POA: Diagnosis not present

## 2022-06-14 DIAGNOSIS — H2512 Age-related nuclear cataract, left eye: Secondary | ICD-10-CM | POA: Diagnosis not present

## 2022-06-14 LAB — BASIC METABOLIC PANEL
BUN/Creatinine Ratio: 13 (ref 12–28)
BUN: 11 mg/dL (ref 8–27)
CO2: 24 mmol/L (ref 20–29)
Calcium: 9.5 mg/dL (ref 8.7–10.3)
Chloride: 94 mmol/L — ABNORMAL LOW (ref 96–106)
Creatinine, Ser: 0.84 mg/dL (ref 0.57–1.00)
Glucose: 91 mg/dL (ref 70–99)
Potassium: 4.3 mmol/L (ref 3.5–5.2)
Sodium: 135 mmol/L (ref 134–144)
eGFR: 78 mL/min/{1.73_m2} (ref >59)

## 2022-06-15 ENCOUNTER — Other Ambulatory Visit (HOSPITAL_COMMUNITY): Payer: Self-pay

## 2022-06-26 ENCOUNTER — Other Ambulatory Visit (HOSPITAL_COMMUNITY): Payer: Self-pay

## 2022-06-26 ENCOUNTER — Other Ambulatory Visit: Payer: Self-pay | Admitting: Primary Care

## 2022-06-26 MED ORDER — OMEPRAZOLE 20 MG PO CPDR
20.0000 mg | DELAYED_RELEASE_CAPSULE | Freq: Every day | ORAL | 3 refills | Status: DC
  Filled 2022-06-26: qty 90, 90d supply, fill #0
  Filled 2022-09-24 – 2022-10-02 (×2): qty 90, 90d supply, fill #1
  Filled 2022-12-25: qty 90, 90d supply, fill #2
  Filled 2023-03-26: qty 90, 90d supply, fill #3

## 2022-07-02 ENCOUNTER — Telehealth (HOSPITAL_COMMUNITY): Payer: Self-pay | Admitting: *Deleted

## 2022-07-02 NOTE — Telephone Encounter (Signed)
Reaching out to patient to offer assistance regarding upcoming cardiac imaging study; pt verbalizes understanding of appt date/time, parking situation and where to check in, medications ordered, and verified current allergies; name and call back number provided for further questions should they arise  Manar Smalling RN Navigator Cardiac Imaging [facility] [phone] office [phone] cell  Patient to take 100mg metoprolol tartrate two hours prior to her cardiac CT scan.  She is aware to arrive at 8:30am. 

## 2022-07-03 ENCOUNTER — Other Ambulatory Visit: Payer: Self-pay | Admitting: Cardiology

## 2022-07-03 ENCOUNTER — Ambulatory Visit (HOSPITAL_COMMUNITY)
Admission: RE | Admit: 2022-07-03 | Discharge: 2022-07-03 | Disposition: A | Discharge status: Home / Self Care | Payer: 59 | Source: Ambulatory Visit | Attending: Interventional Cardiology | Admitting: Interventional Cardiology

## 2022-07-03 DIAGNOSIS — I2510 Atherosclerotic heart disease of native coronary artery without angina pectoris: Secondary | ICD-10-CM | POA: Diagnosis not present

## 2022-07-03 DIAGNOSIS — R931 Abnormal findings on diagnostic imaging of heart and coronary circulation: Secondary | ICD-10-CM

## 2022-07-03 MED ORDER — METOPROLOL TARTRATE 5 MG/5ML IV SOLN
INTRAVENOUS | Status: AC
  Administered 2022-07-03: 10 mg via INTRAVENOUS
  Filled 2022-07-03: qty 10

## 2022-07-03 MED ORDER — METOPROLOL TARTRATE 5 MG/5ML IV SOLN: 10.0000 mg | Freq: Once | INTRAVENOUS | Status: AC

## 2022-07-03 MED ORDER — NITROGLYCERIN 0.4 MG SL SUBL: 0.8000 mg | SUBLINGUAL_TABLET | Freq: Once | SUBLINGUAL | Status: AC

## 2022-07-03 MED ORDER — IOHEXOL 350 MG/ML SOLN
100.0000 mL | Freq: Once | INTRAVENOUS | Status: AC | PRN
  Administered 2022-07-03: 100 mL via INTRAVENOUS

## 2022-07-03 MED ORDER — NITROGLYCERIN 0.4 MG SL SUBL
SUBLINGUAL_TABLET | SUBLINGUAL | Status: AC
  Administered 2022-07-03: 0.8 mg via SUBLINGUAL
  Filled 2022-07-03: qty 2

## 2022-07-03 MED ORDER — METOPROLOL TARTRATE 5 MG/5ML IV SOLN
INTRAVENOUS | Status: AC
  Filled 2022-07-03: qty 5

## 2022-07-04 ENCOUNTER — Ambulatory Visit (HOSPITAL_COMMUNITY)
Admission: RE | Admit: 2022-07-04 | Discharge: 2022-07-04 | Disposition: A | Discharge status: Home / Self Care | Payer: 59 | Source: Ambulatory Visit | Attending: Cardiology | Admitting: Cardiology

## 2022-07-04 ENCOUNTER — Ambulatory Visit (HOSPITAL_BASED_OUTPATIENT_CLINIC_OR_DEPARTMENT_OTHER)
Admission: RE | Admit: 2022-07-04 | Discharge: 2022-07-04 | Disposition: A | Discharge status: Home / Self Care | Payer: 59 | Source: Ambulatory Visit | Attending: Cardiology | Admitting: Cardiology

## 2022-07-04 DIAGNOSIS — I2510 Atherosclerotic heart disease of native coronary artery without angina pectoris: Secondary | ICD-10-CM | POA: Insufficient documentation

## 2022-07-04 DIAGNOSIS — R931 Abnormal findings on diagnostic imaging of heart and coronary circulation: Secondary | ICD-10-CM | POA: Diagnosis not present

## 2022-07-10 ENCOUNTER — Telehealth: Payer: Self-pay

## 2022-07-10 MED ORDER — ASPIRIN 81 MG PO TBEC: 81.0000 mg | DELAYED_RELEASE_TABLET | Freq: Every day | ORAL | 3 refills | Status: AC

## 2022-07-10 NOTE — Telephone Encounter (Signed)
The patient has been notified of the result and verbalized understanding.  All questions (if any) were answered. Antonieta Iba, RN 07/10/2022 4:07 PM  Patient will start ASA 81 mg daily. She states that she recently had labs done at her endocrinologist, Dr. Buddy Duty. I will have them fax over results.

## 2022-07-10 NOTE — Telephone Encounter (Signed)
-----   Message from Belva Crome, MD sent at 07/04/2022  5:24 PM EDT ----- Let the patient know there is a moderate stenosis in the LAD. We need her to be on ASA 81 mg daily and I need to see recent lipids or have her come in to get a fasting Lipid panel. A copy will be sent to Stacie Glaze, DO

## 2022-07-13 ENCOUNTER — Other Ambulatory Visit: Payer: Self-pay | Admitting: Interventional Cardiology

## 2022-07-13 ENCOUNTER — Other Ambulatory Visit (HOSPITAL_COMMUNITY): Payer: Self-pay

## 2022-07-13 ENCOUNTER — Other Ambulatory Visit: Payer: Self-pay | Admitting: Primary Care

## 2022-07-13 MED ORDER — ALBUTEROL SULFATE HFA 108 (90 BASE) MCG/ACT IN AERS
1.0000 | INHALATION_SPRAY | RESPIRATORY_TRACT | 1 refills | Status: DC | PRN
  Filled 2022-07-13: qty 6.7, 33d supply, fill #0
  Filled 2022-09-24 – 2022-10-02 (×2): qty 6.7, 33d supply, fill #1

## 2022-07-13 MED ORDER — MONTELUKAST SODIUM 10 MG PO TABS
10.0000 mg | ORAL_TABLET | Freq: Every day | ORAL | 5 refills | Status: DC
  Filled 2022-07-13: qty 30, 30d supply, fill #0
  Filled 2022-09-24 – 2022-10-02 (×2): qty 30, 30d supply, fill #1
  Filled 2022-10-02: qty 90, 90d supply, fill #1
  Filled 2022-12-25: qty 30, 30d supply, fill #2
  Filled 2023-02-02: qty 30, 30d supply, fill #3

## 2022-07-23 NOTE — Telephone Encounter (Signed)
Left message for patient notifying her we did receive her labwork from Dr. Cindra Eves office and Dr. Tamala Julian did review them. No changes at this time. Provided office number for callback if any questions.

## 2022-07-30 ENCOUNTER — Other Ambulatory Visit (HOSPITAL_COMMUNITY): Payer: Self-pay

## 2022-07-30 MED ORDER — LOSARTAN POTASSIUM-HCTZ 100-12.5 MG PO TABS
1.0000 | ORAL_TABLET | Freq: Every day | ORAL | 0 refills | Status: DC
  Filled 2022-07-30: qty 90, 90d supply, fill #0

## 2022-07-31 ENCOUNTER — Other Ambulatory Visit (HOSPITAL_COMMUNITY): Payer: Self-pay

## 2022-07-31 MED ORDER — ESCITALOPRAM OXALATE 10 MG PO TABS
10.0000 mg | ORAL_TABLET | Freq: Every day | ORAL | 1 refills | Status: DC
  Filled 2022-07-31: qty 60, 60d supply, fill #0
  Filled 2022-09-24: qty 60, 60d supply, fill #1

## 2022-08-09 ENCOUNTER — Other Ambulatory Visit (HOSPITAL_COMMUNITY): Payer: Self-pay

## 2022-08-22 ENCOUNTER — Encounter: Payer: Self-pay | Admitting: Interventional Cardiology

## 2022-08-28 ENCOUNTER — Ambulatory Visit: Payer: 59 | Admitting: Internal Medicine

## 2022-08-28 ENCOUNTER — Other Ambulatory Visit (HOSPITAL_COMMUNITY): Payer: Self-pay

## 2022-08-29 ENCOUNTER — Other Ambulatory Visit (HOSPITAL_COMMUNITY): Payer: Self-pay

## 2022-08-29 MED ORDER — LEVOTHYROXINE SODIUM 112 MCG PO TABS
112.0000 ug | ORAL_TABLET | Freq: Every morning | ORAL | 4 refills | Status: DC
  Filled 2022-08-29: qty 90, 90d supply, fill #0
  Filled 2022-09-24 – 2022-11-25 (×2): qty 90, 90d supply, fill #1
  Filled 2023-02-22: qty 90, 90d supply, fill #2

## 2022-09-10 ENCOUNTER — Other Ambulatory Visit (HOSPITAL_COMMUNITY): Payer: Self-pay

## 2022-09-11 DIAGNOSIS — D2272 Melanocytic nevi of left lower limb, including hip: Secondary | ICD-10-CM | POA: Diagnosis not present

## 2022-09-11 DIAGNOSIS — L821 Other seborrheic keratosis: Secondary | ICD-10-CM | POA: Diagnosis not present

## 2022-09-11 DIAGNOSIS — D224 Melanocytic nevi of scalp and neck: Secondary | ICD-10-CM | POA: Diagnosis not present

## 2022-09-11 DIAGNOSIS — L570 Actinic keratosis: Secondary | ICD-10-CM | POA: Diagnosis not present

## 2022-09-11 DIAGNOSIS — L720 Epidermal cyst: Secondary | ICD-10-CM | POA: Diagnosis not present

## 2022-09-11 DIAGNOSIS — D1801 Hemangioma of skin and subcutaneous tissue: Secondary | ICD-10-CM | POA: Diagnosis not present

## 2022-09-11 DIAGNOSIS — L738 Other specified follicular disorders: Secondary | ICD-10-CM | POA: Diagnosis not present

## 2022-09-11 DIAGNOSIS — D235 Other benign neoplasm of skin of trunk: Secondary | ICD-10-CM | POA: Diagnosis not present

## 2022-09-11 DIAGNOSIS — D225 Melanocytic nevi of trunk: Secondary | ICD-10-CM | POA: Diagnosis not present

## 2022-09-12 ENCOUNTER — Other Ambulatory Visit (HOSPITAL_COMMUNITY): Payer: Self-pay

## 2022-09-12 DIAGNOSIS — Z8639 Personal history of other endocrine, nutritional and metabolic disease: Secondary | ICD-10-CM | POA: Diagnosis not present

## 2022-09-12 DIAGNOSIS — E785 Hyperlipidemia, unspecified: Secondary | ICD-10-CM | POA: Diagnosis not present

## 2022-09-12 DIAGNOSIS — Z1211 Encounter for screening for malignant neoplasm of colon: Secondary | ICD-10-CM | POA: Diagnosis not present

## 2022-09-12 DIAGNOSIS — R7303 Prediabetes: Secondary | ICD-10-CM | POA: Diagnosis not present

## 2022-09-12 DIAGNOSIS — I10 Essential (primary) hypertension: Secondary | ICD-10-CM | POA: Diagnosis not present

## 2022-09-12 DIAGNOSIS — Z0000 Encounter for general adult medical examination without abnormal findings: Secondary | ICD-10-CM | POA: Diagnosis not present

## 2022-09-12 DIAGNOSIS — Z23 Encounter for immunization: Secondary | ICD-10-CM | POA: Diagnosis not present

## 2022-09-12 DIAGNOSIS — E890 Postprocedural hypothyroidism: Secondary | ICD-10-CM | POA: Diagnosis not present

## 2022-09-12 DIAGNOSIS — Z13820 Encounter for screening for osteoporosis: Secondary | ICD-10-CM | POA: Diagnosis not present

## 2022-09-12 DIAGNOSIS — Z7185 Encounter for immunization safety counseling: Secondary | ICD-10-CM | POA: Diagnosis not present

## 2022-09-12 DIAGNOSIS — E669 Obesity, unspecified: Secondary | ICD-10-CM | POA: Diagnosis not present

## 2022-09-12 MED ORDER — ATORVASTATIN CALCIUM 10 MG PO TABS
10.0000 mg | ORAL_TABLET | Freq: Every day | ORAL | 3 refills | Status: DC
  Filled 2022-09-12 – 2022-10-02 (×2): qty 90, 90d supply, fill #0
  Filled 2022-12-25: qty 90, 90d supply, fill #1
  Filled 2023-03-27: qty 90, 90d supply, fill #2
  Filled 2023-06-20: qty 90, 90d supply, fill #3

## 2022-09-12 MED ORDER — LOSARTAN POTASSIUM-HCTZ 100-12.5 MG PO TABS
1.0000 | ORAL_TABLET | Freq: Every day | ORAL | 3 refills | Status: DC
  Filled 2022-09-12 – 2022-10-22 (×3): qty 90, 90d supply, fill #0
  Filled 2023-01-21: qty 90, 90d supply, fill #1
  Filled 2023-04-17: qty 90, 90d supply, fill #2
  Filled 2023-06-20 – 2023-07-08 (×3): qty 90, 90d supply, fill #3

## 2022-09-12 MED ORDER — ESCITALOPRAM OXALATE 10 MG PO TABS
10.0000 mg | ORAL_TABLET | Freq: Every day | ORAL | 3 refills | Status: DC
  Filled 2022-09-12 – 2022-10-02 (×2): qty 90, 90d supply, fill #0
  Filled 2022-12-25: qty 90, 90d supply, fill #1
  Filled 2023-03-26: qty 90, 90d supply, fill #2
  Filled 2023-06-20: qty 90, 90d supply, fill #3

## 2022-09-21 ENCOUNTER — Other Ambulatory Visit (HOSPITAL_COMMUNITY): Payer: Self-pay

## 2022-09-24 ENCOUNTER — Other Ambulatory Visit (HOSPITAL_COMMUNITY): Payer: Self-pay

## 2022-09-25 ENCOUNTER — Telehealth: Payer: Self-pay | Admitting: Internal Medicine

## 2022-09-25 ENCOUNTER — Encounter: Payer: Self-pay | Admitting: Internal Medicine

## 2022-09-25 ENCOUNTER — Other Ambulatory Visit (HOSPITAL_COMMUNITY): Payer: Self-pay

## 2022-09-25 ENCOUNTER — Ambulatory Visit (INDEPENDENT_AMBULATORY_CARE_PROVIDER_SITE_OTHER): Payer: 59 | Admitting: Internal Medicine

## 2022-09-25 VITALS — BP 128/70 | HR 84 | Temp 98.1°F | Ht 64.5 in | Wt 219.0 lb

## 2022-09-25 DIAGNOSIS — R053 Chronic cough: Secondary | ICD-10-CM | POA: Diagnosis not present

## 2022-09-25 DIAGNOSIS — R918 Other nonspecific abnormal finding of lung field: Secondary | ICD-10-CM | POA: Diagnosis not present

## 2022-09-25 DIAGNOSIS — R911 Solitary pulmonary nodule: Secondary | ICD-10-CM

## 2022-09-25 DIAGNOSIS — I272 Pulmonary hypertension, unspecified: Secondary | ICD-10-CM

## 2022-09-25 NOTE — Addendum Note (Signed)
Addended by: Lorretta Harp on: 09/25/2022 01:31 PM   Modules accepted: Orders

## 2022-09-25 NOTE — Progress Notes (Signed)
OV 04/07/2021  Subjective:  Patient ID: Katelyn [name], female , DOB: 1956-01-02 , age 66 y.o. , MRN: [id number] , ADDRESS: [place] [id number] PCP Katelyn Melter, MD Patient Care Team: Katelyn Melter, MD as PCP - General (Family Medicine)  This Provider for this visit: Treatment Team:  Attending Provider: Brand Males, MD    04/07/2021 -   Chief Complaint  Patient presents with   Consult    Patient reports cough x 1 year, non productive, some smells make it worse with the cough,      HPI Katelyn [name] 66 y.o. -presents for evaluation of chronic cough.  I took care of her of her husband 20th-3 years ago in the ICU and he passed away.  Shortly after that her mother also passed away.  She had prolonged grief reaction.  She just came off Lexapro.  She finds fulfillment with the help of her children and her grandchildren.  She has 4-5 grandchildren at this point.  She works as a Dealer at [employer].  She tells me that a few years ago she was on lisinopril and had chronic cough and the cough resolved when she stopped lisinopril.  After that she went on losartan.  She is currently on losartan.  For the last 1 year she has had insidious onset of chronic cough the cough is the same and feels similar to lisinopril cough.  She is wondering if losartan can be causing the cough.  She says the cough is significant.  Symptom severity is below.  Stable since onset.  Cough is mostly in the daytime but sometimes wakes her up at night.  She has laryngeal quality to the cough.  Made worse by talking or laughing.  Made better by staying quiet and drinking cold water.  She not able to talk for a long time and zoom meetings.  This makes her cough and want to reach out to water.  Her voice gets hoarse.  Its a dry cough.  There is no wheezing or shortness of breath.    Cough related issue  - she has seen ENT and apparently vocal cords are red.  Unclear if she has  acid reflux.  - Of note few to several years ago she had thyroidectomy without any problems.  A few years ago she had intubation for the procedure and after that she has not been able to sing  -Unclear if she has acid reflux  -She reports no asthma but has seasonal allergies.  Previous blood eosinophils normal.  Today nitric oxide 9 ppb in and normal  -She does have hypertension on losartan.  Previously had cough with lisinopril.    Results for Katelyn, [name] (MRN [id number]) as of 04/07/2021 09:52  Ref. Range 10/29/2019 10:05  Eosinophils Absolute Latest Ref Range: 0.0 - 0.5 K/uL 0.15 Oct 2021   Pt. Presents for follow up. She was seen 10/02/2021 for cough with wheezing and viral symptoms after sick exposures. CXR at the time showed early edema vs atypical infection. She was started on a prednisone taper, Albuterol nebs and Augmentin in addition to  hydromet cough syrup. She presents today for evaluation of treatment. Pt. States she has been doing better since treatment. She states she completed the Augmentin and prednisone taper. She has been using the albuterol nebs. She is better. She feels she has returned to baseline. She feels her voice is getting back to her  baseline strength. She will have follow up HRCT in 04/2022 with follow up after.   Test Results: CXR 10/02/2021>> Acute visit Interlobular septal thickening suggests early edema, though atypical infection could have this appearance  05/03/2021 HRCT Chest Mild patchy subpleural reticulation and ground-glass opacity in both lungs, asymmetrically prominent in the left lung. No traction bronchiectasis or frank honeycombing. Findings could be due to nonspecific postinfectious/postinflammatory scarring, with an interstitial lung disease such as nonspecific interstitial pneumonia (NSIP) or early usual interstitial pneumonia   04/2021 Labs : Previous visit for ? ILD vs auto immune work up. Serology (including rheumatoid factor,  ANA, scleroderma, TB, Sjogrens were negative) Eosinophils and IgE were somewhat elevated.   OV 09/25/2022  Subjective:  Patient ID: Katelyn [name], female , DOB: 21-Aug-1956 , age 53 y.o. , MRN: [id number] , ADDRESS: [place] [id number] PCP [name], Katelyn Barthel, DO Patient Care Team: Katelyn Bishop, DO as PCP - General (Family Medicine) Katelyn Males, MD as Consulting Physician (Pulmonary Disease)  This Provider for this visit: Treatment Team:  Attending Provider: Brand Males, MD    09/25/2022 -   Chief Complaint  Patient presents with   Follow-up    Pt states she has been doing good since last visit and states she is not coughing anymore.     HPI Katelyn [name] 66 y.o. -returns for follow-up.  I personally saw her 18 months ago.  After that she see nurse practitioners.  At this point in time the cough is nearly resolved.  She only has occasional cough when she gets exposed to dust or cold air at [employer].  She feels it is slightly abnormal compared to her peers but it still at a point where she would consider herself nearly asymptomatic.  She did have CT scan of the chest last summer and again this summer.  There is early interstitial abnormalities but not fitting the pattern of interstitial lung disease.  1 year scan is recommended for summer 2024.  Patient denying any shortness of breath.  In fact symptoms are better.  I discussed with them that she is okay postponing a follow-up CT scan from July 2024 to September 2024.       Dr Katelyn [name] Reflux Symptom Index (> 13-15 suggestive of LPR cough) 0 -> 5  =  none ->severe problem  Hoarseness of problem with voice 3  Clearing  Of Throat 4  Excess throat mucus or feeling of post nasal drip 3  Difficulty swallowing food, liquid or tablets 0  Cough after eating or lying down 5  Breathing difficulties or choking episodes 0  Troublesome or annoying cough 5  Sensation of something sticking in  throat or lump in throat 5  Heartburn, chest pain, indigestion, or stomach acid coming up 1  TOTAL 26      HRCT July 2023   IMPRESSION: 1. Minimal, irregular ground-glass and interstitial opacity in the bilateral lung bases, again more notable on the left. Findings are nonspecific and generally favored to reflect sequelae of prior infection or aspiration, however minimal, early fibrotic interstitial lung disease is not strictly excluded. Consider ongoing annual surveillance if there is high, persistent clinical suspicion for fibrotic interstitial lung disease. Findings are indeterminate for UIP per consensus guidelines: Diagnosis of Idiopathic Pulmonary Fibrosis: An Official ATS/ERS/JRS/ALAT Clinical Practice Guideline. [place], Iss 5, [phone], Jun 15 2017. 2. Mild, diffuse bilateral bronchial wall thickening, consistent with nonspecific infectious or inflammatory  bronchitis. 3. Enlargement of the main pulmonary artery, as can be seen in pulmonary hypertension. 4. Coronary artery disease.   Aortic Atherosclerosis (ICD10-I70.0).     Electronically Signed   By: Delanna Ahmadi M.D.   On: 05/08/2022 14:03         No data to display             has a past medical history of Cancer ([place]), Hypertension, Hypothyroidism, PONV (postoperative nausea and vomiting), Pre-diabetes, S/P laparoscopic assisted vaginal hysterectomy (LAVH) (02/26/2018), Seasonal allergies, and SVD (spontaneous vaginal delivery).   reports that she has never smoked. She has never used smokeless tobacco.  Past Surgical History:  Procedure Laterality Date   ANTERIOR AND POSTERIOR REPAIR N/A 02/26/2018   Procedure: ANTERIOR (CYSTOCELE) AND POSTERIOR REPAIR (RECTOCELE);  Surgeon: Janyth Contes, MD;  Location: [facility];  Service: Gynecology;  Laterality: N/A;   COLONOSCOPY     LAPAROSCOPIC VAGINAL HYSTERECTOMY WITH SALPINGECTOMY Bilateral 02/26/2018   Procedure: LAPAROSCOPIC  ASSISTED VAGINAL HYSTERECTOMY WITH SALPINGECTOMY;  Surgeon: Janyth Contes, MD;  Location: [facility];  Service: Gynecology;  Laterality: Bilateral;   TONSILLECTOMY     TOTAL THYROIDECTOMY     in [place] EXTRACTION      Allergies  Allergen Reactions   Lisinopril Cough   Sulfa Antibiotics Rash    Immunization History  Administered Date(s) Administered   Hepatitis B, adult 04/22/1985, 05/19/1985, 10/22/1985   Influenza Whole 07/16/2011, 07/23/2012, 08/03/2013, 07/06/2014   Influenza,inj,quad, With Preservative 07/25/2015   MMR 11/03/2003   PFIZER(Purple Top)SARS-COV-2 Vaccination 10/14/2019, 11/04/2019, 08/03/2020   Tdap 03/10/2008, 05/28/2016   Zoster, Live 10/22/2018, 03/18/2019    Family History  Problem Relation Age of Onset   Heart block Mother    Heart attack Father    Heart attack Sister      Current Outpatient Medications:    aspirin EC 81 MG tablet, Take 1 tablet (81 mg total) by mouth daily. Swallow whole., Disp: 90 tablet, Rfl: 3   atorvastatin (LIPITOR) 10 MG tablet, Take 1 tablet (10 mg total) by mouth at bedtime., Disp: 90 tablet, Rfl: 3   Calcium Carb-Cholecalciferol (CALCIUM+D3 PO), Take 1 tablet by mouth at bedtime., Disp: , Rfl:    escitalopram (LEXAPRO) 10 MG tablet, Take 1 tablet (10 mg total) by mouth daily., Disp: 90 tablet, Rfl: 3   levothyroxine (SYNTHROID) 112 MCG tablet, Take 1 tablet (112 mcg total) by mouth in the morning on an empty stomach., Disp: 90 tablet, Rfl: 4   losartan-hydrochlorothiazide (HYZAAR) 100-12.5 MG tablet, Take 1 tablet by mouth daily., Disp: 90 tablet, Rfl: 3   metFORMIN (GLUCOPHAGE-XR) 500 MG 24 hr tablet, Take 4 tablets (2,000 mg total) by mouth daily., Disp: 360 tablet, Rfl: 3   montelukast (SINGULAIR) 10 MG tablet, Take 1 tablet (10 mg total) by mouth at bedtime., Disp: 30 tablet, Rfl: 5   omeprazole (PRILOSEC) 20 MG capsule, Take 1 capsule (20 mg total) by mouth daily., Disp: 90 capsule, Rfl: 3    Semaglutide-Weight Management (WEGOVY) 2.4 MG/0.75ML SOAJ, Inject 2.4 mg into the skin once a week., Disp: 3 mL, Rfl: 11   albuterol (PROVENTIL) (2.5 MG/3ML) 0.083% nebulizer solution, Take 3 mLs (2.5 mg total) by nebulization every 6 (six) hours as needed for wheezing or shortness of breath. (Patient not taking: Reported on 06/13/2022), Disp: 75 mL, Rfl: 12   albuterol (VENTOLIN HFA) 108 (90 Base) MCG/ACT inhaler, Inhale 1 puff by mouth into the lungs every 4 (four) hours as needed. (Patient  not taking: Reported on 09/25/2022), Disp: 6.7 g, Rfl: 1      Objective:   Vitals:   09/25/22 1306  BP: 128/70  Pulse: 84  Temp: 98.1 F (36.7 C)  TempSrc: Oral  SpO2: 95%  Weight: 219 lb (99.3 kg)  Height: 5' 4.5" (1.638 m)    Estimated body mass index is 37.01 kg/m as calculated from the following:   Height as of this encounter: 5' 4.5" (1.638 m).   Weight as of this encounter: 219 lb (99.3 kg).  _0 @  Filed Weights   09/25/22 1306  Weight: 219 lb (99.3 kg)     Physical Exam General: No distress. obese Neuro: Alert and Oriented x 3. GCS 15. Speech normal Psych: Pleasant Resp:  Barrel Chest - no.  Wheeze - no, Crackles - no, No overt respiratory distress CVS: Normal heart sounds. Murmurs - no Ext: Stigmata of Connective Tissue Disease - no HEENT: Normal upper airway. PEERL +. No post nasal drip        Assessment:       ICD-10-CM   1. Chronic cough  R05.3     2. Opacity of lung on imaging study  R91.8          Plan:     Patient Instructions     ICD-10-CM   1. Chronic cough  R05.3     2. Opacity of lung on imaging study  R91.8      Cough/not a significant problem anymore nearly resolved Last CT scan of the chest July 2023 with subtle abnormalities of what is called "ILA"; interstitial lung abnormalities  Plan - Move high-resolution CT scan of the chest from July 2024 to September 2024 -Full pulmonary function test September 2024  Follow-up - Return  in September 2024 for 15-minute visit  -Return sooner if needed 6    SIGNATURE    Dr. Brand [name], M.D., F.C.C.P,  Pulmonary and Critical Care Medicine Staff Physician, [place] Director - Interstitial Lung Disease  Program  [facility] [place], [place], [address]  Pager: [phone], If no answer or between  15:00h - 7:00h: call [phone] Telephone: [phone]  1:25 PM 09/25/2022

## 2022-09-25 NOTE — Telephone Encounter (Signed)
Pt saw MR today for a follow up and at visit, it was stated for pt's CT to be changed from June 2024 to September 2024.  Routing to PCCs.

## 2022-09-25 NOTE — Patient Instructions (Addendum)
ICD-10-CM   1. Chronic cough  R05.3     2. Opacity of lung on imaging study  R91.8      Cough/not a significant problem anymore nearly resolved Last CT scan of the chest July 2023 with subtle abnormalities of what is called "ILA"; interstitial lung abnormalities  Plan - Move high-resolution CT scan of the chest from July 2024 to September 2024 -Full pulmonary function test September 2024  Follow-up - Return in September 2024 for 15-minute visit  -Return sooner if needed 6

## 2022-10-01 NOTE — Telephone Encounter (Signed)
Ernest Mallick looks like the provider wants this to be moved to Sept

## 2022-10-02 ENCOUNTER — Other Ambulatory Visit (HOSPITAL_COMMUNITY): Payer: Self-pay

## 2022-10-02 NOTE — Telephone Encounter (Signed)
Livingston Diones, RT  to [facility] Triage Pool     10/02/22  9:34 AM I can move the date but there will have to be a new order placed. This order will only let us schedule up til 05/15/23. Do you want to place a new order, or just move it to 7/31?   New order was placed for HRCT to be done Sept 2024

## 2022-10-03 ENCOUNTER — Other Ambulatory Visit: Payer: Self-pay

## 2022-10-03 ENCOUNTER — Other Ambulatory Visit (HOSPITAL_COMMUNITY): Payer: Self-pay

## 2022-10-22 ENCOUNTER — Other Ambulatory Visit (HOSPITAL_COMMUNITY): Payer: Self-pay

## 2022-11-02 DIAGNOSIS — E890 Postprocedural hypothyroidism: Secondary | ICD-10-CM | POA: Diagnosis not present

## 2022-11-12 DIAGNOSIS — E669 Obesity, unspecified: Secondary | ICD-10-CM | POA: Diagnosis not present

## 2022-11-12 DIAGNOSIS — E785 Hyperlipidemia, unspecified: Secondary | ICD-10-CM | POA: Diagnosis not present

## 2022-11-12 DIAGNOSIS — E890 Postprocedural hypothyroidism: Secondary | ICD-10-CM | POA: Diagnosis not present

## 2022-11-12 DIAGNOSIS — R7303 Prediabetes: Secondary | ICD-10-CM | POA: Diagnosis not present

## 2022-11-12 DIAGNOSIS — Z85850 Personal history of malignant neoplasm of thyroid: Secondary | ICD-10-CM | POA: Diagnosis not present

## 2022-11-13 ENCOUNTER — Other Ambulatory Visit (HOSPITAL_COMMUNITY): Payer: Self-pay

## 2022-11-14 ENCOUNTER — Other Ambulatory Visit (HOSPITAL_COMMUNITY): Payer: Self-pay

## 2022-11-14 ENCOUNTER — Other Ambulatory Visit: Payer: Self-pay

## 2022-11-15 ENCOUNTER — Other Ambulatory Visit (HOSPITAL_COMMUNITY): Payer: Self-pay

## 2022-12-07 ENCOUNTER — Other Ambulatory Visit: Payer: Self-pay | Admitting: Obstetrics and Gynecology

## 2022-12-07 ENCOUNTER — Other Ambulatory Visit (HOSPITAL_COMMUNITY): Payer: Self-pay

## 2022-12-07 DIAGNOSIS — Z1231 Encounter for screening mammogram for malignant neoplasm of breast: Secondary | ICD-10-CM

## 2022-12-07 MED ORDER — LEVOTHYROXINE SODIUM 112 MCG PO TABS
112.0000 ug | ORAL_TABLET | Freq: Every morning | ORAL | 4 refills | Status: DC
  Filled 2023-05-21: qty 90, 90d supply, fill #0
  Filled 2023-08-29 (×2): qty 90, 90d supply, fill #1

## 2022-12-10 ENCOUNTER — Other Ambulatory Visit (HOSPITAL_COMMUNITY): Payer: Self-pay

## 2022-12-10 MED ORDER — WEGOVY 2.4 MG/0.75ML ~~LOC~~ SOAJ
2.4000 mg | SUBCUTANEOUS | 11 refills | Status: DC
  Filled 2022-12-10 – 2022-12-25 (×2): qty 3, 28d supply, fill #0
  Filled 2023-02-02 – 2023-02-07 (×3): qty 3, 28d supply, fill #1
  Filled 2023-03-04 – 2023-03-12 (×2): qty 3, 28d supply, fill #2

## 2022-12-24 ENCOUNTER — Other Ambulatory Visit (HOSPITAL_COMMUNITY): Payer: Self-pay

## 2022-12-25 ENCOUNTER — Other Ambulatory Visit (HOSPITAL_COMMUNITY): Payer: Self-pay

## 2023-01-03 ENCOUNTER — Other Ambulatory Visit (HOSPITAL_COMMUNITY): Payer: Self-pay

## 2023-01-04 ENCOUNTER — Other Ambulatory Visit (HOSPITAL_COMMUNITY): Payer: Self-pay

## 2023-01-07 IMAGING — MG MM DIGITAL DIAGNOSTIC UNILAT*R* W/ TOMO W/ CAD
8 series · 9 of 24 positions shown · non-contrast
Comparison: Previous exam(s).

CLINICAL DATA: Recall from screening to evaluate possible right
breast distortion.

EXAM:
DIGITAL DIAGNOSTIC UNILATERAL RIGHT MAMMOGRAM WITH TOMO AND CAD

[R ML synth-2D]
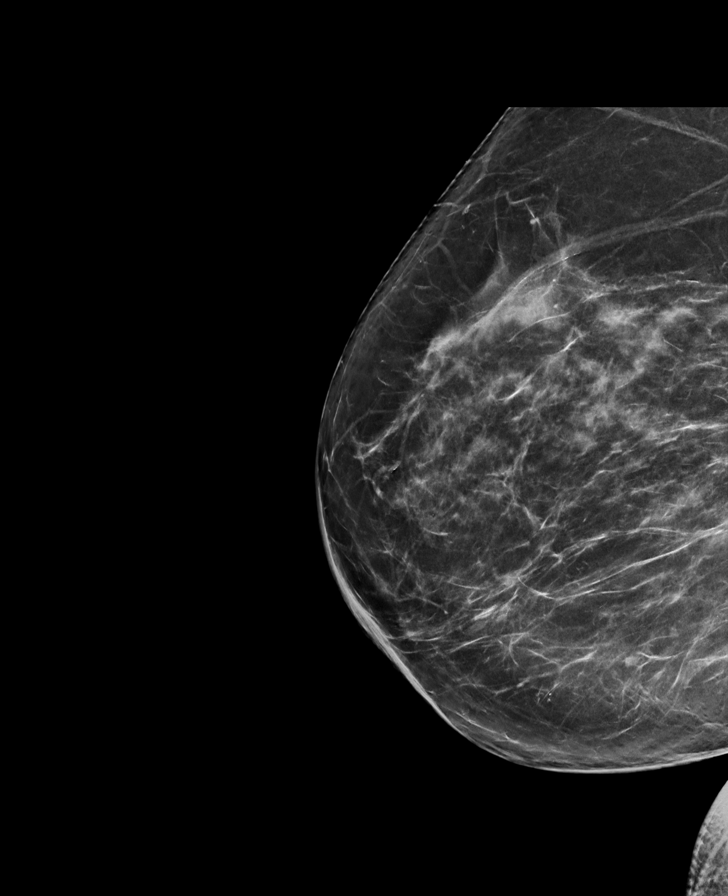

[R MLO synth-2D (1 of 2)]
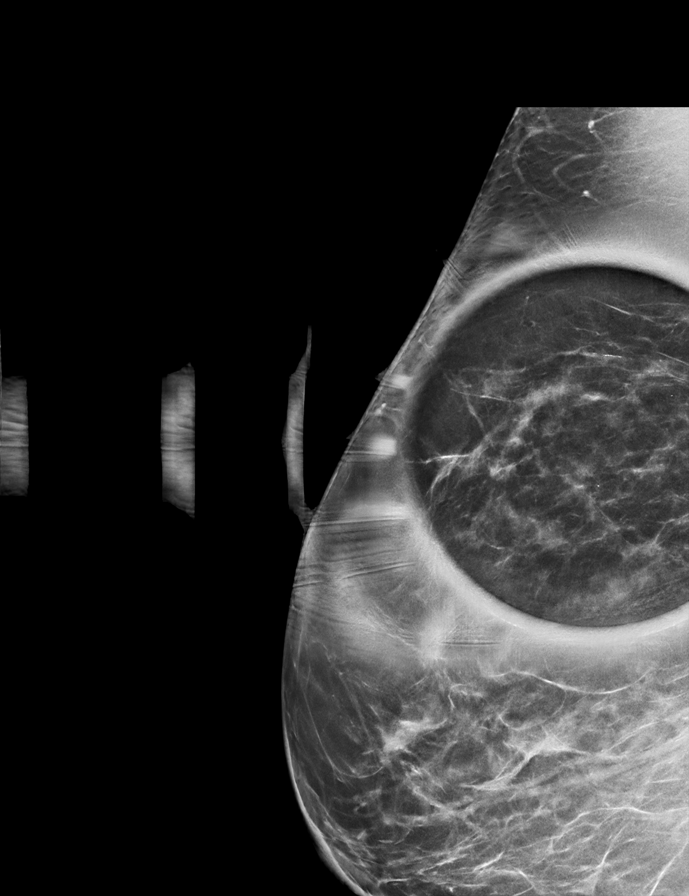

[R CC synth-2D]
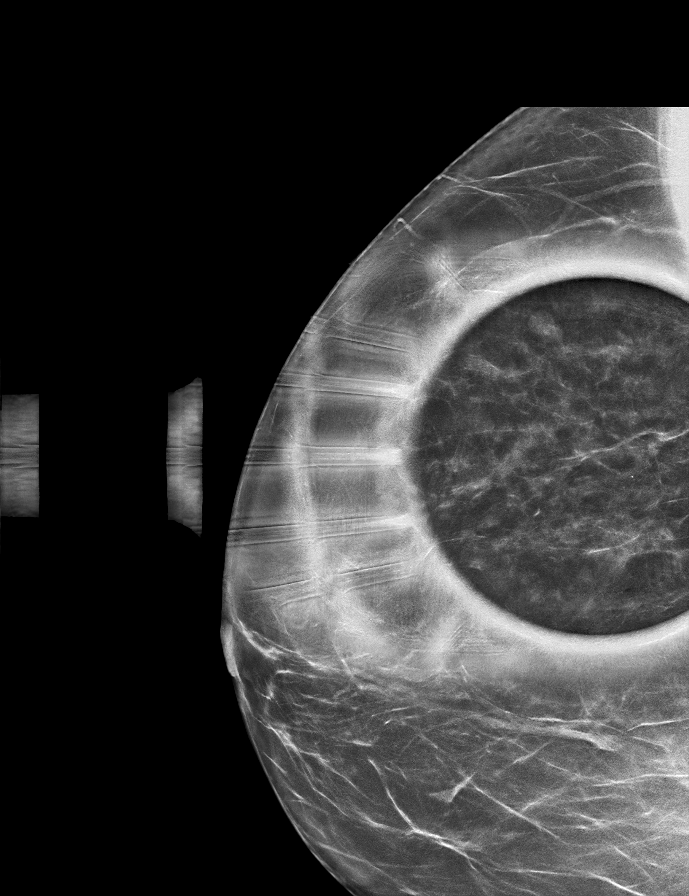

[R MLO synth-2D (2 of 2)]
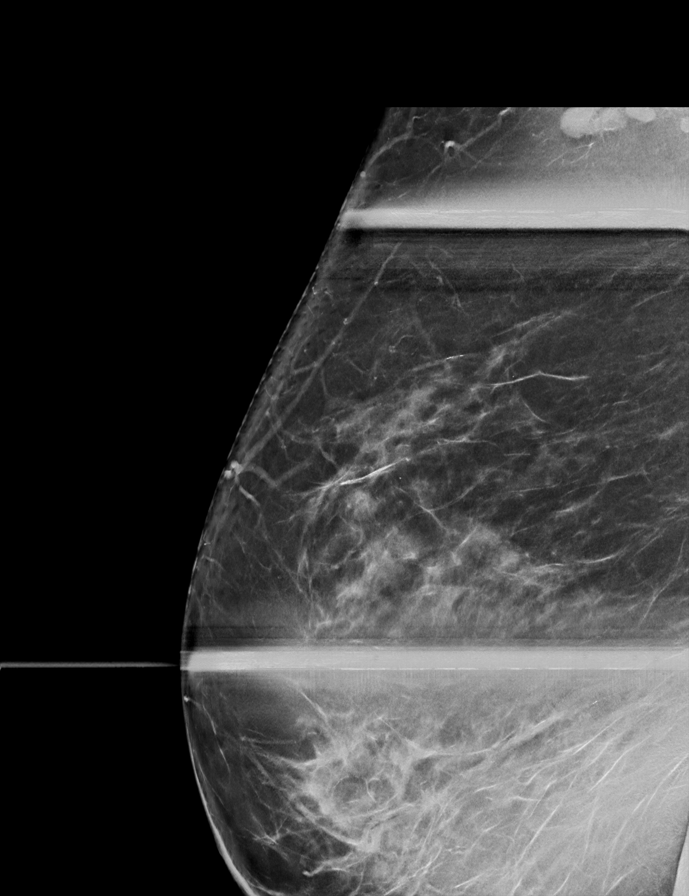

[R ML tomo · 2 of 82 frames shown]
[frame 27/82]
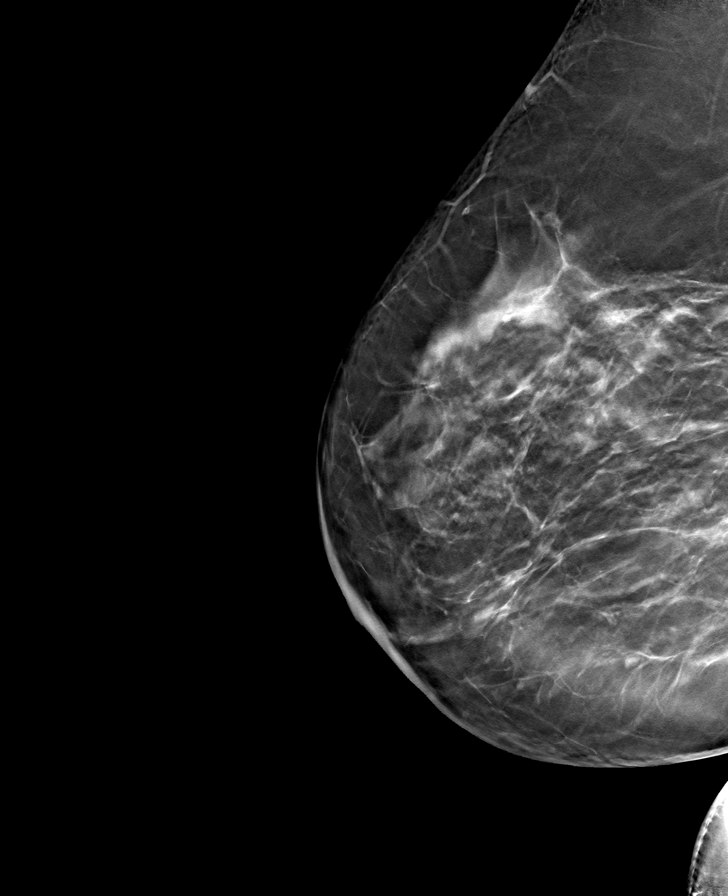
[frame 41/82]
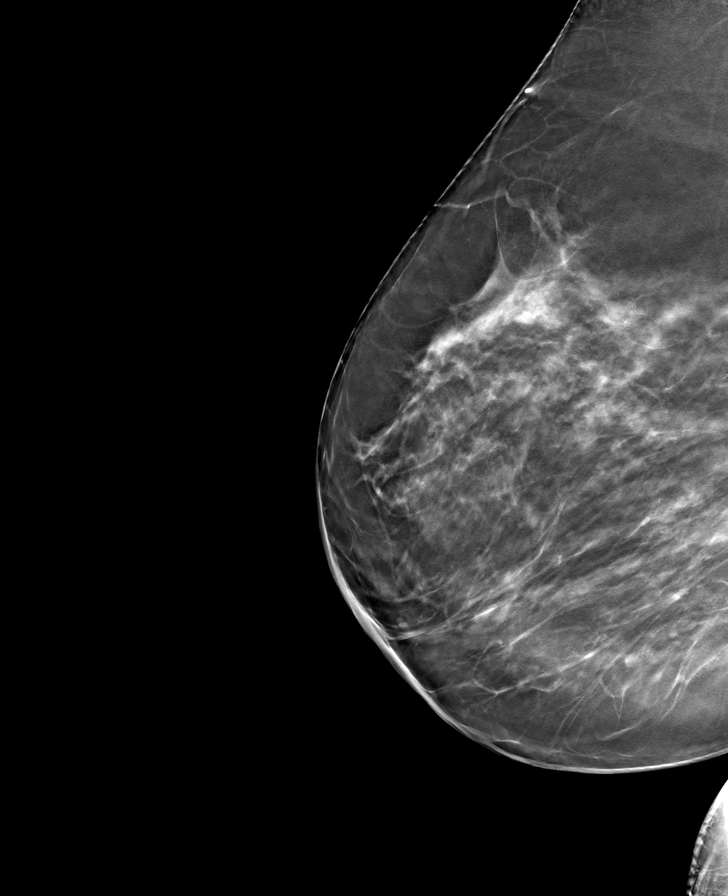

[R MLO tomo (1 of 2) · tomo slice 46/91.0]
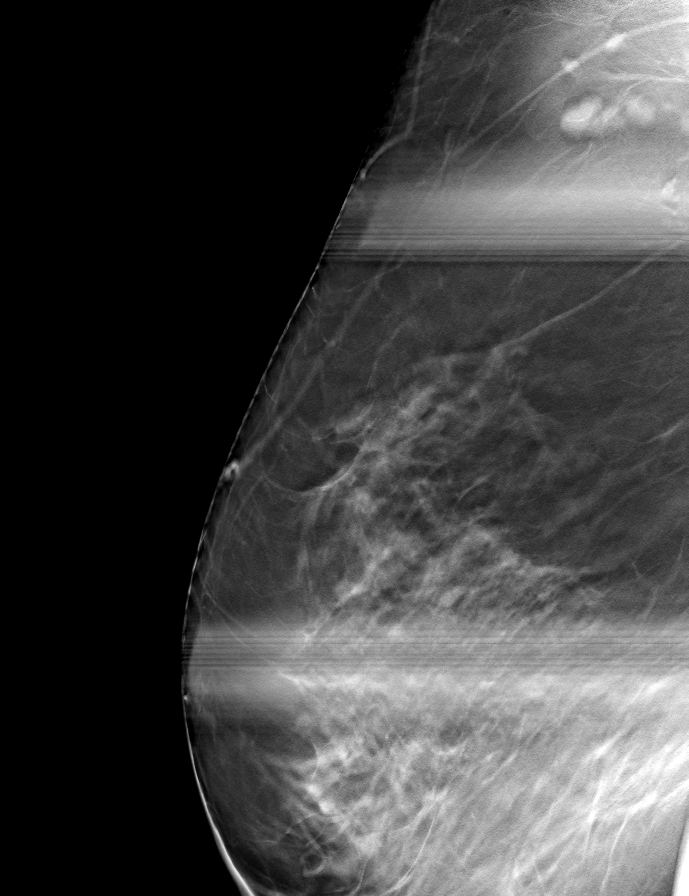

[R CC tomo · tomo slice 35/69.0]
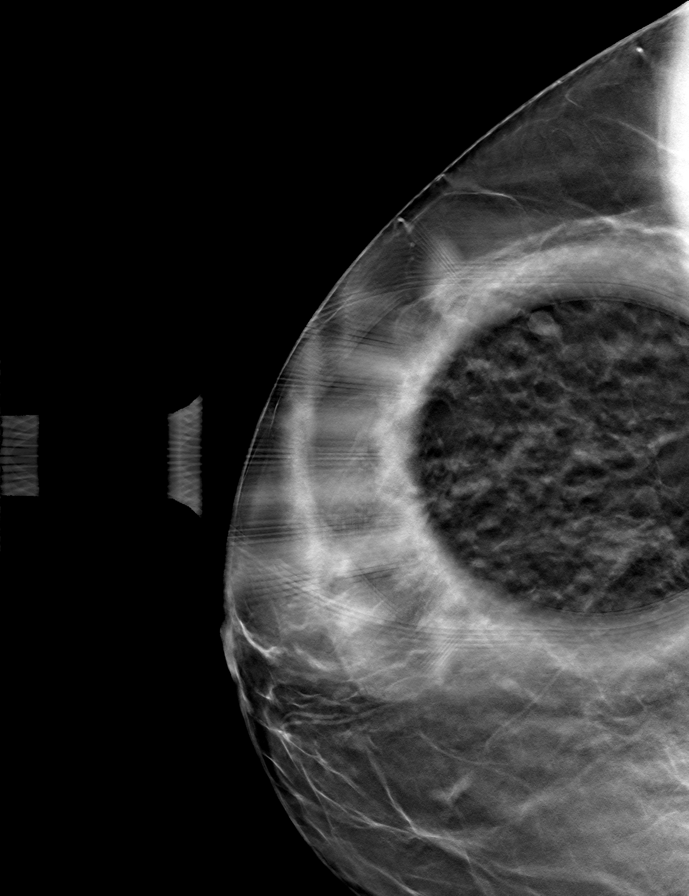

[R MLO tomo (2 of 2) · tomo slice 38/75.0]
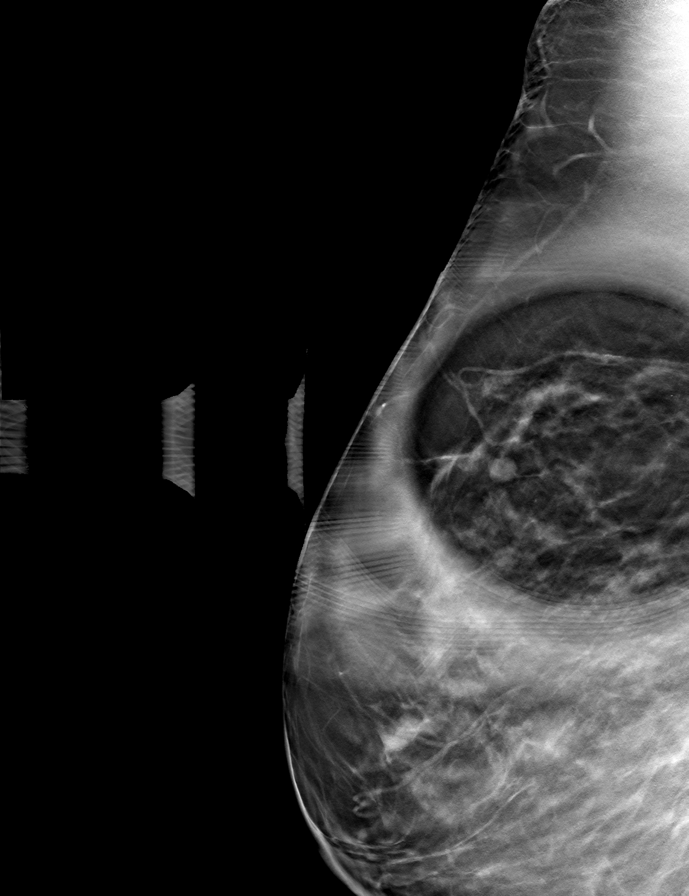

[9 of 24 positions shown; findings below may reference images not displayed]

ACR Breast Density Category b: There are scattered areas of
fibroglandular density.
FINDINGS: Multiple additional tomographic images of the right breast
demonstrate no focal distortion over the upper mid to outer right
breast.

Mammographic images were processed with CAD.
IMPRESSION: No focal distortion over the right upper mid to outer breast.

RECOMMENDATION:
Recommend continued annual bilateral screening mammographic
follow-up.

I have discussed the findings and recommendations with the patient.
If applicable, a reminder letter will be sent to the patient
regarding the next appointment.

BI-RADS CATEGORY  1: Negative.

## 2023-01-17 ENCOUNTER — Telehealth: Payer: 59 | Admitting: Family Medicine

## 2023-01-17 ENCOUNTER — Other Ambulatory Visit (HOSPITAL_COMMUNITY): Payer: Self-pay

## 2023-01-17 DIAGNOSIS — J3089 Other allergic rhinitis: Secondary | ICD-10-CM | POA: Diagnosis not present

## 2023-01-17 DIAGNOSIS — J069 Acute upper respiratory infection, unspecified: Secondary | ICD-10-CM

## 2023-01-17 MED ORDER — PSEUDOEPH-BROMPHEN-DM 30-2-10 MG/5ML PO SYRP
5.0000 mL | ORAL_SOLUTION | Freq: Four times a day (QID) | ORAL | 0 refills | Status: DC | PRN
  Filled 2023-01-17: qty 120, 6d supply, fill #0

## 2023-01-17 MED ORDER — FLUTICASONE PROPIONATE 50 MCG/ACT NA SUSP
2.0000 | Freq: Every day | NASAL | 0 refills | Status: DC
  Filled 2023-01-17: qty 16, 30d supply, fill #0

## 2023-01-17 MED ORDER — BENZONATATE 100 MG PO CAPS
100.0000 mg | ORAL_CAPSULE | Freq: Three times a day (TID) | ORAL | 0 refills | Status: AC | PRN
  Filled 2023-01-17: qty 20, 7d supply, fill #0

## 2023-01-17 MED ORDER — FLUTICASONE PROPIONATE 50 MCG/ACT NA SUSP
1.0000 | Freq: Every day | NASAL | 1 refills | Status: AC
  Filled 2023-01-17 – 2023-02-02 (×2): qty 16, 30d supply, fill #0

## 2023-01-17 MED ORDER — AMOXICILLIN-POT CLAVULANATE 875-125 MG PO TABS
1.0000 | ORAL_TABLET | Freq: Two times a day (BID) | ORAL | 0 refills | Status: AC
  Filled 2023-01-17: qty 14, 7d supply, fill #0

## 2023-01-17 NOTE — Progress Notes (Signed)
E-Visit for Upper Respiratory Infection   We are sorry you are not feeling well.  Here is how we plan to help!  Based on what you have shared with me, it looks like you may have a viral upper respiratory infection.  Upper respiratory infections are caused by a large number of viruses; however, rhinovirus is the most common cause.   Symptoms vary from person to person, with common symptoms including sore throat, cough, fatigue or lack of energy and feeling of general discomfort.  A low-grade fever of up to 100.4 may present, but is often uncommon.  Symptoms vary however, and are closely related to a person's age or underlying illnesses.  The most common symptoms associated with an upper respiratory infection are nasal discharge or congestion, cough, sneezing, headache and pressure in the ears and face.  These symptoms usually persist for about 3 to 10 days, but can last up to 2 weeks.  It is important to know that upper respiratory infections do not cause serious illness or complications in most cases.    Upper respiratory infections can be transmitted from person to person, with the most common method of transmission being a person's hands.  The virus is able to live on the skin and can infect other persons for up to 2 hours after direct contact.  Also, these can be transmitted when someone coughs or sneezes; thus, it is important to cover the mouth to reduce this risk.  To keep the spread of the illness at [place], good hand hygiene is very important.  This is an infection that is most likely caused by a virus. There are no specific treatments other than to help you with the symptoms until the infection runs its course.  We are sorry you are not feeling well.  Here is how we plan to help!   For nasal congestion, you may use an oral decongestants such as Mucinex D or if you have glaucoma or high blood pressure use plain Mucinex.  Saline nasal spray or nasal drops can help and can safely be used as often as  needed for congestion.  For your congestion, I have prescribed Fluticasone nasal spray one spray in each nostril twice a day  If you do not have a history of heart disease, hypertension, diabetes or thyroid disease, prostate/bladder issues or glaucoma, you may also use Sudafed to treat nasal congestion.  It is highly recommended that you consult with a pharmacist or your primary care physician to ensure this medication is safe for you to take.     If you have a cough, you may use cough suppressants such as Delsym and Robitussin.  If you have glaucoma or high blood pressure, you can also use Coricidin HBP.   For cough I have prescribed for you A prescription cough medication called Tessalon Perles 100 mg. You may take 1-2 capsules every 8 hours as needed for cough and a cough syrup- brompheniramine DM to help with cough and congestion.  I will order a delayed antibiotic of Augmentin, if you are not feeling better in the next 48 hours you can pick it up.   If you have a sore or scratchy throat, use a saltwater gargle-  to  teaspoon of salt dissolved in a 4-ounce to 8-ounce glass of warm water.  Gargle the solution for approximately 15-30 seconds and then spit.  It is important not to swallow the solution.  You can also use throat lozenges/cough drops and Chloraseptic spray to help  with throat pain or discomfort.  Warm or cold liquids can also be helpful in relieving throat pain.  For headache, pain or general discomfort, you can use Ibuprofen or Tylenol as directed.   Some authorities believe that zinc sprays or the use of Echinacea may shorten the course of your symptoms.   HOME CARE Only take medications as instructed by your medical team. Be sure to drink plenty of fluids. Water is fine as well as fruit juices, sodas and electrolyte beverages. You may want to stay away from caffeine or alcohol. If you are nauseated, try taking small sips of liquids. How do you know if you are getting enough  fluid? Your urine should be a pale yellow or almost colorless. Get rest. Taking a steamy shower or using a humidifier may help nasal congestion and ease sore throat pain. You can place a towel over your head and breathe in the steam from hot water coming from a faucet. Using a saline nasal spray works much the same way. Cough drops, hard candies and sore throat lozenges may ease your cough. Avoid close contacts especially the very young and the elderly Cover your mouth if you cough or sneeze Always remember to wash your hands.   GET HELP RIGHT AWAY IF: You develop worsening fever. If your symptoms do not improve within 10 days You develop yellow or green discharge from your nose over 3 days. You have coughing fits You develop a severe head ache or visual changes. You develop shortness of breath, difficulty breathing or start having chest pain Your symptoms persist after you have completed your treatment plan  MAKE SURE YOU  Understand these instructions. Will watch your condition. Will get help right away if you are not doing well or get worse.  Thank you for choosing an e-visit.  Your e-visit answers were reviewed by a board certified advanced clinical practitioner to complete your personal care plan. Depending upon the condition, your plan could have included both over the counter or prescription medications.  Please review your pharmacy choice. Make sure the pharmacy is open so you can pick up prescription now. If there is a problem, you may contact your provider through [employer] and have the prescription routed to another pharmacy.  Your safety is important to [place]. If you have drug allergies check your prescription carefully.   For the next 24 hours you can use MyChart to ask questions about today's visit, request a non-urgent call back, or ask for a work or school excuse. You will get an email in the next two days asking about your experience. I hope that your e-visit has  been valuable and will speed your recovery.    I provided 5 minutes of non face-to-face time during this encounter for chart review, medication and order placement, as well as and documentation.

## 2023-01-21 ENCOUNTER — Ambulatory Visit
Admission: RE | Admit: 2023-01-21 | Discharge: 2023-01-21 | Disposition: A | Discharge status: Home / Self Care | Payer: 59 | Source: Ambulatory Visit | Attending: Obstetrics and Gynecology | Admitting: Obstetrics and Gynecology

## 2023-01-21 DIAGNOSIS — Z1231 Encounter for screening mammogram for malignant neoplasm of breast: Secondary | ICD-10-CM | POA: Diagnosis not present

## 2023-01-22 ENCOUNTER — Other Ambulatory Visit: Payer: Self-pay | Admitting: Obstetrics and Gynecology

## 2023-01-22 DIAGNOSIS — R928 Other abnormal and inconclusive findings on diagnostic imaging of breast: Secondary | ICD-10-CM

## 2023-01-29 ENCOUNTER — Ambulatory Visit
Admission: RE | Admit: 2023-01-29 | Discharge: 2023-01-29 | Disposition: A | Discharge status: Home / Self Care | Payer: 59 | Source: Ambulatory Visit | Attending: Obstetrics and Gynecology | Admitting: Obstetrics and Gynecology

## 2023-01-29 ENCOUNTER — Ambulatory Visit: Admission: RE | Admit: 2023-01-29 | Payer: 59 | Source: Ambulatory Visit

## 2023-01-29 DIAGNOSIS — R928 Other abnormal and inconclusive findings on diagnostic imaging of breast: Secondary | ICD-10-CM

## 2023-02-04 ENCOUNTER — Other Ambulatory Visit (HOSPITAL_COMMUNITY): Payer: Self-pay

## 2023-02-04 ENCOUNTER — Other Ambulatory Visit: Payer: Self-pay

## 2023-02-05 ENCOUNTER — Encounter (HOSPITAL_COMMUNITY): Payer: Self-pay

## 2023-02-05 ENCOUNTER — Other Ambulatory Visit (HOSPITAL_COMMUNITY): Payer: Self-pay

## 2023-02-07 ENCOUNTER — Other Ambulatory Visit (HOSPITAL_COMMUNITY): Payer: Self-pay

## 2023-02-08 ENCOUNTER — Other Ambulatory Visit (HOSPITAL_COMMUNITY): Payer: Self-pay

## 2023-02-12 ENCOUNTER — Other Ambulatory Visit (HOSPITAL_COMMUNITY): Payer: Self-pay

## 2023-02-12 MED ORDER — ALBUTEROL SULFATE HFA 108 (90 BASE) MCG/ACT IN AERS
1.0000 | INHALATION_SPRAY | RESPIRATORY_TRACT | 1 refills | Status: DC | PRN
  Filled 2023-02-12: qty 6.7, 30d supply, fill #0
  Filled 2023-03-26: qty 6.7, 30d supply, fill #1

## 2023-03-04 ENCOUNTER — Other Ambulatory Visit (HOSPITAL_COMMUNITY): Payer: Self-pay

## 2023-03-05 ENCOUNTER — Other Ambulatory Visit (HOSPITAL_COMMUNITY): Payer: Self-pay

## 2023-03-14 ENCOUNTER — Other Ambulatory Visit (HOSPITAL_COMMUNITY): Payer: Self-pay

## 2023-03-14 DIAGNOSIS — Z85850 Personal history of malignant neoplasm of thyroid: Secondary | ICD-10-CM | POA: Diagnosis not present

## 2023-03-14 DIAGNOSIS — E890 Postprocedural hypothyroidism: Secondary | ICD-10-CM | POA: Diagnosis not present

## 2023-03-14 DIAGNOSIS — E785 Hyperlipidemia, unspecified: Secondary | ICD-10-CM | POA: Diagnosis not present

## 2023-03-14 DIAGNOSIS — E669 Obesity, unspecified: Secondary | ICD-10-CM | POA: Diagnosis not present

## 2023-03-14 DIAGNOSIS — I10 Essential (primary) hypertension: Secondary | ICD-10-CM | POA: Diagnosis not present

## 2023-03-14 DIAGNOSIS — R7303 Prediabetes: Secondary | ICD-10-CM | POA: Diagnosis not present

## 2023-03-14 DIAGNOSIS — Z6838 Body mass index (BMI) 38.0-38.9, adult: Secondary | ICD-10-CM | POA: Diagnosis not present

## 2023-03-26 ENCOUNTER — Other Ambulatory Visit: Payer: Self-pay

## 2023-03-26 ENCOUNTER — Other Ambulatory Visit: Payer: Self-pay | Admitting: Acute Care

## 2023-03-26 ENCOUNTER — Other Ambulatory Visit (HOSPITAL_COMMUNITY): Payer: Self-pay

## 2023-04-11 ENCOUNTER — Ambulatory Visit (HOSPITAL_COMMUNITY): Payer: Commercial Managed Care - PPO

## 2023-05-21 ENCOUNTER — Other Ambulatory Visit (HOSPITAL_COMMUNITY): Payer: Self-pay

## 2023-05-21 ENCOUNTER — Other Ambulatory Visit: Payer: Self-pay | Admitting: Acute Care

## 2023-05-21 ENCOUNTER — Other Ambulatory Visit: Payer: Self-pay

## 2023-05-21 MED ORDER — METFORMIN HCL ER 500 MG PO TB24
2000.0000 mg | ORAL_TABLET | Freq: Every day | ORAL | 3 refills | Status: AC
  Filled 2023-05-21: qty 360, 90d supply, fill #0
  Filled 2023-12-09: qty 360, 90d supply, fill #1
  Filled 2024-03-04: qty 360, 90d supply, fill #2

## 2023-05-23 ENCOUNTER — Other Ambulatory Visit (HOSPITAL_COMMUNITY): Payer: Self-pay

## 2023-05-23 MED ORDER — MONTELUKAST SODIUM 10 MG PO TABS
10.0000 mg | ORAL_TABLET | Freq: Every day | ORAL | 1 refills | Status: DC
  Filled 2023-05-23: qty 90, 90d supply, fill #0
  Filled 2023-09-07: qty 90, 90d supply, fill #1

## 2023-06-10 ENCOUNTER — Encounter: Payer: Self-pay | Admitting: Cardiology

## 2023-06-10 ENCOUNTER — Ambulatory Visit: Payer: 59 | Attending: Cardiology | Admitting: Cardiology

## 2023-06-10 VITALS — BP 142/88 | HR 67 | Ht 65.0 in | Wt 236.0 lb

## 2023-06-10 DIAGNOSIS — I272 Pulmonary hypertension, unspecified: Secondary | ICD-10-CM

## 2023-06-10 DIAGNOSIS — I10 Essential (primary) hypertension: Secondary | ICD-10-CM | POA: Diagnosis not present

## 2023-06-10 DIAGNOSIS — I2510 Atherosclerotic heart disease of native coronary artery without angina pectoris: Secondary | ICD-10-CM

## 2023-06-10 DIAGNOSIS — E782 Mixed hyperlipidemia: Secondary | ICD-10-CM | POA: Diagnosis not present

## 2023-06-10 NOTE — Patient Instructions (Signed)
Medication Instructions:  No changes *If you need a refill on your cardiac medications before your next appointment, please call your pharmacy*  Follow-Up: At [facility], you and your health needs are our priority.  As part of our continuing mission to provide you with exceptional heart care, we have created designated Provider Care Teams.  These Care Teams include your primary Cardiologist (physician) and Advanced Practice Providers (APPs -  Physician Assistants and Nurse Practitioners) who all work together to provide you with the care you need, when you need it.  We recommend signing up for the patient portal called "MyChart".  Sign up information is provided on this After Visit Summary.  MyChart is used to connect with patients for Virtual Visits (Telemedicine).  Patients are able to view lab/test results, encounter notes, upcoming appointments, etc.  Non-urgent messages can be sent to your provider as well.   To learn more about what you can do with MyChart, go to [email or web address].    Your next appointment:   1 year(s)  Provider:   Dr Servando Salina  Other Instructions Take BP daily for 2 weeks and upload to MyChart

## 2023-06-10 NOTE — Progress Notes (Signed)
Cardiology Office Note:    Date:  06/10/2023   ID:  JANECIA PURA, DOB [birth year], MRN [id number]  PCP:  Koren Shiver, DO  Cardiologist:  None  Electrophysiologist:  None   Referring MD: Koren Shiver, DO     History of Present Illness:    LINDE LEISING is a 67 y.o. female with a hx of history of coronary artery disease seen on coronary CT in September 2023, prediabetes, hypertension, hyperlipidemia, concern for pulmonary hypertension (no evidence of pulmonary artery pressures), obesity.   No complaints from a cardiovascular standpoint.  She tells me that she has been taken off her Wegovy which really has not helped her goal for weight loss.  But she is increased her activity.  No chest pain, no shortness of breath,   Past Medical History:  Diagnosis Date   Cancer (HCC)    thyroid cancer   Hypertension    Hypothyroidism    PONV (postoperative nausea and vomiting)    Pre-diabetes    S/P laparoscopic assisted vaginal hysterectomy (LAVH) 02/26/2018   Seasonal allergies    SVD (spontaneous vaginal delivery)    x 2    Past Surgical History:  Procedure Laterality Date   ANTERIOR AND POSTERIOR REPAIR N/A 02/26/2018   Procedure: ANTERIOR (CYSTOCELE) AND POSTERIOR REPAIR (RECTOCELE);  Surgeon: Sherian Rein, MD;  Location: [facility];  Service: Gynecology;  Laterality: N/A;   COLONOSCOPY     LAPAROSCOPIC VAGINAL HYSTERECTOMY WITH SALPINGECTOMY Bilateral 02/26/2018   Procedure: LAPAROSCOPIC ASSISTED VAGINAL HYSTERECTOMY WITH SALPINGECTOMY;  Surgeon: Sherian Rein, MD;  Location: [facility];  Service: Gynecology;  Laterality: Bilateral;   TONSILLECTOMY     TOTAL THYROIDECTOMY     in [place]   WISDOM TOOTH EXTRACTION      Current Medications: Current Meds  Medication Sig   albuterol (VENTOLIN HFA) 108 (90 Base) MCG/ACT inhaler Inhale 1 puff into the lungs every 4 (four) hours as needed.   aspirin EC 81 MG tablet Take 1 tablet (81 mg total) by mouth daily. Swallow  whole.   atorvastatin (LIPITOR) 10 MG tablet Take 1 tablet (10 mg total) by mouth at bedtime.   Calcium Carb-Cholecalciferol (CALCIUM+D3 PO) Take 1 tablet by mouth at bedtime.   escitalopram (LEXAPRO) 10 MG tablet Take 1 tablet (10 mg total) by mouth daily.   fluticasone (FLONASE) 50 MCG/ACT nasal spray Place 2 sprays into both nostrils daily.   levothyroxine (SYNTHROID) 112 MCG tablet Take 1 tablet (112 mcg total) by mouth in the morning on an empty stomach.   levothyroxine (SYNTHROID) 112 MCG tablet Take 1 tablet (112 mcg total) by mouth in the morning on an empty stomach.   losartan-hydrochlorothiazide (HYZAAR) 100-12.5 MG tablet Take 1 tablet by mouth daily.   metFORMIN (GLUCOPHAGE-XR) 500 MG 24 hr tablet Take 4 tablets (2,000 mg total) by mouth daily.   montelukast (SINGULAIR) 10 MG tablet Take 1 tablet (10 mg total) by mouth at bedtime.   omeprazole (PRILOSEC) 20 MG capsule Take 1 capsule (20 mg total) by mouth daily.     Allergies:   Lisinopril and Sulfa antibiotics   Social History   Socioeconomic History   Marital status: Widowed    Spouse name: Not on file   Number of children: Not on file   Years of education: Not on file   Highest education level: Not on file  Occupational History   Not on file  Tobacco Use   Smoking status: Never   Smokeless tobacco: Never  Vaping  Use   Vaping status: Never Used  Substance and Sexual Activity   Alcohol use: Never   Drug use: Never   Sexual activity: Yes    Birth control/protection: Post-menopausal  Other Topics Concern   Not on file  Social History Narrative   Not on file   Social Determinants of Health   Financial Resource Strain: Not on file  Food Insecurity: Not on file  Transportation Needs: Not on file  Physical Activity: Not on file  Stress: Not on file  Social Connections: Not on file     Family History: The patient's family history includes Heart attack in her father and sister; Heart block in her  mother.  ROS:   Review of Systems  Constitution: Negative for decreased appetite, fever and weight gain.  HENT: Negative for congestion, ear discharge, hoarse voice and sore throat.   Eyes: Negative for discharge, redness, vision loss in right eye and visual halos.  Cardiovascular: Negative for chest pain, dyspnea on exertion, leg swelling, orthopnea and palpitations.  Respiratory: Negative for cough, hemoptysis, shortness of breath and snoring.   Endocrine: Negative for heat intolerance and polyphagia.  Hematologic/Lymphatic: Negative for bleeding problem. Does not bruise/bleed easily.  Skin: Negative for flushing, nail changes, rash and suspicious lesions.  Musculoskeletal: Negative for arthritis, joint pain, muscle cramps, myalgias, neck pain and stiffness.  Gastrointestinal: Negative for abdominal pain, bowel incontinence, diarrhea and excessive appetite.  Genitourinary: Negative for decreased libido, genital sores and incomplete emptying.  Neurological: Negative for brief paralysis, focal weakness, headaches and loss of balance.  Psychiatric/Behavioral: Negative for altered mental status, depression and suicidal ideas.  Allergic/Immunologic: Negative for HIV exposure and persistent infections.    EKGs/Labs/Other Studies Reviewed:    The following studies were reviewed today:   EKG:  The ekg ordered today demonstrates sinus rhythm, Heart rate 69 bpm  Recent Labs: 06/13/2022: BUN 11; Creatinine, Ser 0.84; Potassium 4.3; Sodium 135  Recent Lipid Panel No results found for: "CHOL", "TRIG", "HDL", "CHOLHDL", "VLDL", "LDLCALC", "LDLDIRECT"  Physical Exam:    VS:  BP (!) 142/88   Pulse 67   Ht 5\' 5"  (1.651 m)   Wt 236 lb (107 kg)   LMP  (LMP Unknown)   SpO2 99%   BMI 39.27 kg/m     Wt Readings from Last 3 Encounters:  06/10/23 236 lb (107 kg)  09/25/22 219 lb (99.3 kg)  06/13/22 231 lb 6.4 oz (105 kg)     GEN: Well nourished, well developed in no acute distress HEENT:  Normal NECK: No JVD; No carotid bruits LYMPHATICS: No lymphadenopathy CARDIAC: S1S2 noted,RRR, no murmurs, rubs, gallops RESPIRATORY:  Clear to auscultation without rales, wheezing or rhonchi  ABDOMEN: Soft, non-tender, non-distended, +bowel sounds, no guarding. EXTREMITIES: No edema, No cyanosis, no clubbing MUSCULOSKELETAL:  No deformity  SKIN: Warm and dry NEUROLOGIC:  Alert and oriented x 3, non-focal PSYCHIATRIC:  Normal affect, good insight  ASSESSMENT:    1. Coronary artery disease involving native coronary artery of native heart without angina pectoris   2. Pulmonary hypertension (HCC)   3. Mild CAD   4. Morbid obesity (HCC)   5. Mixed hyperlipidemia   6. Primary hypertension    PLAN:    From a cardiovascular standpoint she appears to be doing well.  No chest pain, no shortness of breath, no lightheadedness or dizziness.  She has not been hospitalized.  Will continue her aspirin and statin.  Blood pressure slightly on the elevated side manually done by me  148/92 elevated mercury.  She will do her blood pressure on a daily basis and upload this via MyChart for my review.  If this is elevated at that time I will optimize her antihypertensive.  But for now she will continue to hold losartan-hydrochlorothiazide 100-12.5 mg daily.  The patient understands the need to lose weight with diet and exercise. We have discussed specific strategies for this.  The patient is in agreement with the above plan. The patient left the office in stable condition.  The patient will follow up in   Medication Adjustments/Labs and Tests Ordered: Current medicines are reviewed at length with the patient today.  Concerns regarding medicines are outlined above.  Orders Placed This Encounter  Procedures   EKG 12-Lead   No orders of the defined types were placed in this encounter.   Patient Instructions  Medication Instructions:  No changes *If you need a refill on your cardiac medications  before your next appointment, please call your pharmacy*  Follow-Up: At [facility], you and your health needs are our priority.  As part of our continuing mission to provide you with exceptional heart care, we have created designated Provider Care Teams.  These Care Teams include your primary Cardiologist (physician) and Advanced Practice Providers (APPs -  Physician Assistants and Nurse Practitioners) who all work together to provide you with the care you need, when you need it.  We recommend signing up for the patient portal called "MyChart".  Sign up information is provided on this After Visit Summary.  MyChart is used to connect with patients for Virtual Visits (Telemedicine).  Patients are able to view lab/test results, encounter notes, upcoming appointments, etc.  Non-urgent messages can be sent to your provider as well.   To learn more about what you can do with MyChart, go to [email or web address].    Your next appointment:   1 year(s)  Provider:   Dr Servando Salina  Other Instructions Take BP daily for 2 weeks and upload to MyChart    Adopting a Healthy Lifestyle.  Know what a healthy weight is for you (roughly BMI <25) and aim to maintain this   Aim for 7+ servings of fruits and vegetables daily   65-80+ fluid ounces of water or unsweet tea for healthy kidneys   Limit to max 1 drink of alcohol per day; avoid smoking/tobacco   Limit animal fats in diet for cholesterol and heart health - choose grass fed whenever available   Avoid highly processed foods, and foods high in saturated/trans fats   Aim for low stress - take time to unwind and care for your mental health   Aim for 150 min of moderate intensity exercise weekly for heart health, and weights twice weekly for bone health   Aim for 7-9 hours of sleep daily   When it comes to diets, agreement about the perfect plan isnt easy to find, even among the experts. Experts at the [facility] [employer]  developed an idea known as the Healthy Eating Plate. Just imagine a plate divided into logical, healthy portions.   The emphasis is on diet quality:   Load up on vegetables and fruits - one-half of your plate: Aim for color and variety, and remember that potatoes dont count.   Go for whole grains - one-quarter of your plate: Whole wheat, barley, wheat berries, quinoa, oats, brown rice, and foods made with them. If you want pasta, go with whole wheat pasta.   Protein power - one-quarter  of your plate: Fish, chicken, beans, and nuts are all healthy, versatile protein sources. Limit red meat.   The diet, however, does go beyond the plate, offering a few other suggestions.   Use healthy plant oils, such as olive, canola, soy, corn, sunflower and peanut. Check the labels, and avoid partially hydrogenated oil, which have unhealthy trans fats.   If youre thirsty, drink water. Coffee and tea are good in moderation, but skip sugary drinks and limit milk and dairy products to one or two daily servings.   The type of carbohydrate in the diet is more important than the amount. Some sources of carbohydrates, such as vegetables, fruits, whole grains, and beans-are healthier than others.   Finally, stay active  Osvaldo Shipper, DO  06/10/2023 9:56 PM    [facility]

## 2023-06-20 ENCOUNTER — Ambulatory Visit (HOSPITAL_COMMUNITY)
Admission: RE | Admit: 2023-06-20 | Discharge: 2023-06-20 | Disposition: A | Discharge status: Home / Self Care | Payer: 59 | Source: Ambulatory Visit | Attending: Internal Medicine | Admitting: Internal Medicine

## 2023-06-20 ENCOUNTER — Other Ambulatory Visit: Payer: Self-pay | Admitting: Primary Care

## 2023-06-20 ENCOUNTER — Other Ambulatory Visit: Payer: Self-pay

## 2023-06-20 ENCOUNTER — Other Ambulatory Visit (HOSPITAL_COMMUNITY): Payer: Self-pay

## 2023-06-20 DIAGNOSIS — I272 Pulmonary hypertension, unspecified: Secondary | ICD-10-CM | POA: Diagnosis not present

## 2023-06-20 DIAGNOSIS — R911 Solitary pulmonary nodule: Secondary | ICD-10-CM

## 2023-06-20 DIAGNOSIS — R053 Chronic cough: Secondary | ICD-10-CM

## 2023-06-20 DIAGNOSIS — R918 Other nonspecific abnormal finding of lung field: Secondary | ICD-10-CM | POA: Diagnosis not present

## 2023-06-21 ENCOUNTER — Other Ambulatory Visit (HOSPITAL_COMMUNITY): Payer: Self-pay

## 2023-06-21 MED ORDER — OMEPRAZOLE 20 MG PO CPDR
20.0000 mg | DELAYED_RELEASE_CAPSULE | Freq: Every day | ORAL | 0 refills | Status: DC
  Filled 2023-06-21: qty 90, 90d supply, fill #0

## 2023-06-26 ENCOUNTER — Encounter: Payer: Self-pay | Admitting: Internal Medicine

## 2023-07-01 NOTE — Telephone Encounter (Signed)
  Results just came out. No ILD. Please note that radiology is having > 1 week delay in giving results.     IMPRESSION: 1. No evidence of interstitial lung disease. Minimal air trapping is indicative of small airways disease. 2. Aortic atherosclerosis (ICD10-I70.0). Left anterior descending coronary artery calcification.     Electronically Signed   By: Leanna Battles M.D.   On: 07/01/2023 11:13

## 2023-07-02 ENCOUNTER — Encounter: Payer: Self-pay | Admitting: Cardiology

## 2023-07-02 NOTE — Telephone Encounter (Signed)
Spoke with pt. Recommended f/u with Dr. Marchelle Gearing to review lung CT results and if he refers her for additional cardiac consult then we can get her an appt scheduled. Pt acknowledged understanding and had no further questions at this time.

## 2023-07-04 ENCOUNTER — Other Ambulatory Visit (HOSPITAL_COMMUNITY): Payer: Self-pay

## 2023-07-08 ENCOUNTER — Other Ambulatory Visit (HOSPITAL_COMMUNITY): Payer: Self-pay

## 2023-08-14 DIAGNOSIS — E782 Mixed hyperlipidemia: Secondary | ICD-10-CM | POA: Diagnosis not present

## 2023-08-14 DIAGNOSIS — R053 Chronic cough: Secondary | ICD-10-CM | POA: Diagnosis not present

## 2023-08-14 DIAGNOSIS — I10 Essential (primary) hypertension: Secondary | ICD-10-CM | POA: Diagnosis not present

## 2023-08-14 DIAGNOSIS — C73 Malignant neoplasm of thyroid gland: Secondary | ICD-10-CM | POA: Diagnosis not present

## 2023-08-14 DIAGNOSIS — I2510 Atherosclerotic heart disease of native coronary artery without angina pectoris: Secondary | ICD-10-CM | POA: Diagnosis not present

## 2023-08-14 DIAGNOSIS — F32A Depression, unspecified: Secondary | ICD-10-CM | POA: Diagnosis not present

## 2023-08-14 DIAGNOSIS — E890 Postprocedural hypothyroidism: Secondary | ICD-10-CM | POA: Diagnosis not present

## 2023-08-14 DIAGNOSIS — H34231 Retinal artery branch occlusion, right eye: Secondary | ICD-10-CM | POA: Diagnosis not present

## 2023-08-14 DIAGNOSIS — R7303 Prediabetes: Secondary | ICD-10-CM | POA: Diagnosis not present

## 2023-08-15 DIAGNOSIS — H2512 Age-related nuclear cataract, left eye: Secondary | ICD-10-CM | POA: Diagnosis not present

## 2023-08-15 DIAGNOSIS — H43392 Other vitreous opacities, left eye: Secondary | ICD-10-CM | POA: Diagnosis not present

## 2023-08-15 DIAGNOSIS — H524 Presbyopia: Secondary | ICD-10-CM | POA: Diagnosis not present

## 2023-08-15 DIAGNOSIS — H11002 Unspecified pterygium of left eye: Secondary | ICD-10-CM | POA: Diagnosis not present

## 2023-08-15 DIAGNOSIS — H34232 Retinal artery branch occlusion, left eye: Secondary | ICD-10-CM | POA: Diagnosis not present

## 2023-08-27 DIAGNOSIS — E785 Hyperlipidemia, unspecified: Secondary | ICD-10-CM | POA: Diagnosis not present

## 2023-08-27 DIAGNOSIS — I10 Essential (primary) hypertension: Secondary | ICD-10-CM | POA: Diagnosis not present

## 2023-08-27 DIAGNOSIS — Z85850 Personal history of malignant neoplasm of thyroid: Secondary | ICD-10-CM | POA: Diagnosis not present

## 2023-08-27 DIAGNOSIS — R7303 Prediabetes: Secondary | ICD-10-CM | POA: Diagnosis not present

## 2023-08-27 DIAGNOSIS — E890 Postprocedural hypothyroidism: Secondary | ICD-10-CM | POA: Diagnosis not present

## 2023-08-29 ENCOUNTER — Other Ambulatory Visit: Payer: Self-pay

## 2023-08-29 ENCOUNTER — Other Ambulatory Visit (HOSPITAL_COMMUNITY): Payer: Self-pay

## 2023-08-29 DIAGNOSIS — R7303 Prediabetes: Secondary | ICD-10-CM | POA: Diagnosis not present

## 2023-08-29 DIAGNOSIS — F32A Depression, unspecified: Secondary | ICD-10-CM | POA: Diagnosis not present

## 2023-08-29 DIAGNOSIS — I10 Essential (primary) hypertension: Secondary | ICD-10-CM | POA: Diagnosis not present

## 2023-08-29 DIAGNOSIS — I2510 Atherosclerotic heart disease of native coronary artery without angina pectoris: Secondary | ICD-10-CM | POA: Diagnosis not present

## 2023-08-29 DIAGNOSIS — Z85850 Personal history of malignant neoplasm of thyroid: Secondary | ICD-10-CM | POA: Diagnosis not present

## 2023-08-29 DIAGNOSIS — H34231 Retinal artery branch occlusion, right eye: Secondary | ICD-10-CM | POA: Diagnosis not present

## 2023-08-29 DIAGNOSIS — E782 Mixed hyperlipidemia: Secondary | ICD-10-CM | POA: Diagnosis not present

## 2023-08-29 MED ORDER — WEGOVY 0.5 MG/0.5ML ~~LOC~~ SOAJ: 0.5000 mg | SUBCUTANEOUS | 3 refills | Status: DC

## 2023-08-29 MED ORDER — WEGOVY 0.25 MG/0.5ML ~~LOC~~ SOAJ
SUBCUTANEOUS | 0 refills | Status: DC
  Filled 2023-08-29 – 2023-12-05 (×3): qty 2, 28d supply, fill #0

## 2023-09-02 ENCOUNTER — Other Ambulatory Visit: Payer: Self-pay

## 2023-09-03 ENCOUNTER — Other Ambulatory Visit: Payer: Self-pay

## 2023-09-06 ENCOUNTER — Other Ambulatory Visit: Payer: Self-pay

## 2023-09-07 ENCOUNTER — Other Ambulatory Visit: Payer: Self-pay | Admitting: Primary Care

## 2023-09-09 ENCOUNTER — Other Ambulatory Visit: Payer: Self-pay

## 2023-09-10 ENCOUNTER — Other Ambulatory Visit: Payer: Self-pay

## 2023-09-13 ENCOUNTER — Other Ambulatory Visit (HOSPITAL_COMMUNITY): Payer: Self-pay

## 2023-09-13 ENCOUNTER — Other Ambulatory Visit: Payer: Self-pay

## 2023-09-13 MED ORDER — OMEPRAZOLE 20 MG PO CPDR
20.0000 mg | DELAYED_RELEASE_CAPSULE | Freq: Every day | ORAL | 0 refills | Status: DC
  Filled 2023-09-13: qty 90, 90d supply, fill #0

## 2023-09-16 ENCOUNTER — Other Ambulatory Visit (HOSPITAL_COMMUNITY): Payer: Self-pay

## 2023-09-16 MED ORDER — LOSARTAN POTASSIUM-HCTZ 100-12.5 MG PO TABS
1.0000 | ORAL_TABLET | Freq: Every day | ORAL | 0 refills | Status: DC
  Filled 2023-09-16: qty 90, 90d supply, fill #0

## 2023-09-16 MED ORDER — LEVOTHYROXINE SODIUM 112 MCG PO TABS: 112.0000 ug | ORAL_TABLET | Freq: Every day | ORAL | 0 refills | Status: DC

## 2023-09-16 MED ORDER — ATORVASTATIN CALCIUM 10 MG PO TABS
10.0000 mg | ORAL_TABLET | Freq: Every day | ORAL | 0 refills | Status: DC
  Filled 2023-09-16: qty 90, 90d supply, fill #0

## 2023-09-18 ENCOUNTER — Other Ambulatory Visit (HOSPITAL_COMMUNITY): Payer: Self-pay

## 2023-09-18 DIAGNOSIS — F4321 Adjustment disorder with depressed mood: Secondary | ICD-10-CM | POA: Diagnosis not present

## 2023-09-18 DIAGNOSIS — Z1231 Encounter for screening mammogram for malignant neoplasm of breast: Secondary | ICD-10-CM | POA: Diagnosis not present

## 2023-09-18 DIAGNOSIS — Z85850 Personal history of malignant neoplasm of thyroid: Secondary | ICD-10-CM | POA: Diagnosis not present

## 2023-09-18 DIAGNOSIS — E782 Mixed hyperlipidemia: Secondary | ICD-10-CM | POA: Diagnosis not present

## 2023-09-18 DIAGNOSIS — E890 Postprocedural hypothyroidism: Secondary | ICD-10-CM | POA: Diagnosis not present

## 2023-09-18 DIAGNOSIS — I10 Essential (primary) hypertension: Secondary | ICD-10-CM | POA: Diagnosis not present

## 2023-09-18 DIAGNOSIS — Z1211 Encounter for screening for malignant neoplasm of colon: Secondary | ICD-10-CM | POA: Diagnosis not present

## 2023-09-18 DIAGNOSIS — Z0000 Encounter for general adult medical examination without abnormal findings: Secondary | ICD-10-CM | POA: Diagnosis not present

## 2023-09-18 DIAGNOSIS — Z780 Asymptomatic menopausal state: Secondary | ICD-10-CM | POA: Diagnosis not present

## 2023-09-18 DIAGNOSIS — R7303 Prediabetes: Secondary | ICD-10-CM | POA: Diagnosis not present

## 2023-09-18 MED ORDER — ESCITALOPRAM OXALATE 10 MG PO TABS
10.0000 mg | ORAL_TABLET | Freq: Every day | ORAL | 3 refills | Status: AC
  Filled 2023-09-18: qty 90, 90d supply, fill #0
  Filled 2023-12-09: qty 90, 90d supply, fill #1
  Filled 2024-03-16: qty 90, 90d supply, fill #2
  Filled 2024-06-08: qty 90, 90d supply, fill #3

## 2023-09-18 MED ORDER — LOSARTAN POTASSIUM-HCTZ 100-25 MG PO TABS
1.0000 | ORAL_TABLET | Freq: Every day | ORAL | 3 refills | Status: DC
  Filled 2023-09-18: qty 90, 90d supply, fill #0
  Filled 2023-12-06: qty 90, 90d supply, fill #1

## 2023-09-23 ENCOUNTER — Other Ambulatory Visit: Payer: Self-pay

## 2023-09-30 ENCOUNTER — Other Ambulatory Visit: Payer: Self-pay

## 2023-10-01 ENCOUNTER — Other Ambulatory Visit: Payer: Self-pay

## 2023-10-03 ENCOUNTER — Other Ambulatory Visit: Payer: Self-pay

## 2023-10-07 ENCOUNTER — Other Ambulatory Visit: Payer: Self-pay

## 2023-10-30 ENCOUNTER — Other Ambulatory Visit: Payer: Self-pay

## 2023-11-04 ENCOUNTER — Other Ambulatory Visit: Payer: Self-pay

## 2023-11-06 ENCOUNTER — Other Ambulatory Visit: Payer: Self-pay

## 2023-11-21 ENCOUNTER — Ambulatory Visit: Payer: Commercial Managed Care - PPO | Admitting: Internal Medicine

## 2023-11-21 ENCOUNTER — Other Ambulatory Visit: Payer: Self-pay | Admitting: Internal Medicine

## 2023-11-21 DIAGNOSIS — R053 Chronic cough: Secondary | ICD-10-CM | POA: Diagnosis not present

## 2023-11-21 LAB — PULMONARY FUNCTION TEST
DL/VA % pred: 124 %
DL/VA: 5.22 ml/min/mmHg/L
DLCO cor % pred: 100 %
DLCO cor: 19.17 ml/min/mmHg
DLCO unc % pred: 100 %
DLCO unc: 19.17 ml/min/mmHg
FEF 25-75 Post: 2.72 L/s
FEF 25-75 Pre: 2.21 L/s
FEF2575-%Change-Post: 23 %
FEF2575-%Pred-Post: 134 %
FEF2575-%Pred-Pre: 108 %
FEV1-%Change-Post: 6 %
FEV1-%Pred-Post: 96 %
FEV1-%Pred-Pre: 90 %
FEV1-Post: 2.21 L
FEV1-Pre: 2.08 L
FEV1FVC-%Change-Post: 5 %
FEV1FVC-%Pred-Pre: 106 %
FEV6-%Change-Post: -6 %
FEV6-%Pred-Post: 82 %
FEV6-%Pred-Pre: 88 %
FEV6-Post: 2.37 L
FEV6-Pre: 2.55 L
FEV6FVC-%Change-Post: -7 %
FEV6FVC-%Pred-Post: 96 %
FEV6FVC-%Pred-Pre: 104 %
FVC-%Change-Post: 0 %
FVC-%Pred-Post: 85 %
FVC-%Pred-Pre: 84 %
FVC-Post: 2.56 L
FVC-Pre: 2.55 L
Post FEV1/FVC ratio: 86 %
Post FEV6/FVC ratio: 93 %
Pre FEV1/FVC ratio: 82 %
Pre FEV6/FVC Ratio: 100 %
RV % pred: 102 %
RV: 2.11 L
TLC % pred: 94 %
TLC: 4.66 L

## 2023-11-21 NOTE — Patient Instructions (Signed)
 Full PFT performed today.

## 2023-11-21 NOTE — Progress Notes (Signed)
 Full PFT performed today.

## 2023-11-22 ENCOUNTER — Other Ambulatory Visit (HOSPITAL_COMMUNITY): Payer: Self-pay

## 2023-11-22 ENCOUNTER — Encounter: Payer: Self-pay | Admitting: Internal Medicine

## 2023-11-22 ENCOUNTER — Ambulatory Visit: Payer: Commercial Managed Care - PPO | Admitting: Internal Medicine

## 2023-11-22 VITALS — BP 138/74 | HR 72 | Ht 63.0 in | Wt 253.8 lb

## 2023-11-22 DIAGNOSIS — R918 Other nonspecific abnormal finding of lung field: Secondary | ICD-10-CM

## 2023-11-22 DIAGNOSIS — R053 Chronic cough: Secondary | ICD-10-CM | POA: Diagnosis not present

## 2023-11-22 MED ORDER — LEVOTHYROXINE SODIUM 112 MCG PO TABS
112.0000 ug | ORAL_TABLET | Freq: Every morning | ORAL | 4 refills | Status: AC
  Filled 2023-11-22: qty 90, 90d supply, fill #0

## 2023-11-22 NOTE — Patient Instructions (Addendum)
 ICD-10-CM   1. Chronic cough  R05.3     2. Opacity of lung on imaging study  R91.8        Cough/not a significant problem but a mild problem - could be airway irritability like asthma  Last CT scan of the chest July 2023 and Sept 2024 with subtle abnormalities of what is called ILA; interstitial lung abnormalities - but no change.  - overall good outlook but needs monitoring  Plan -Full pulmonary function test feb 2026  Follow-up - Return in 1 year feb 2026; 15 min after PFT

## 2023-11-22 NOTE — Progress Notes (Signed)
 OV 04/07/2021  Subjective:  Patient ID: Katelyn [name], female , DOB: September 20, 1956 , age 68 y.o. , MRN: [id number] , ADDRESS: [address] [place] [address] PCP Nanci Senior, MD Patient Care Team: Nanci Senior, MD as PCP - General (Family Medicine)  This Provider for this visit: Treatment Team:  Attending Provider: Geronimo Amel, MD    04/07/2021 -   Chief Complaint  Patient presents with   Consult    Patient reports cough x 1 year, non productive, some smells make it worse with the cough,      HPI Katelyn [name] 68 y.o. -presents for evaluation of chronic cough.  I took care of her of her husband 20th-3 years ago in the ICU and he passed away.  Shortly after that her mother also passed away.  She had prolonged grief reaction.  She just came off Lexapro .  She finds fulfillment with the help of her children and her grandchildren.  She has 4-5 grandchildren at this point.  She works as a public affairs consultant at [employer].  She tells me that a few years ago she was on lisinopril and had chronic cough and the cough resolved when she stopped lisinopril.  After that she went on losartan .  She is currently on losartan .  For the last 1 year she has had insidious onset of chronic cough the cough is the same and feels similar to lisinopril cough.  She is wondering if losartan  can be causing the cough.  She says the cough is significant.  Symptom severity is below.  Stable since onset.  Cough is mostly in the daytime but sometimes wakes her up at night.  She has laryngeal quality to the cough.  Made worse by talking or laughing.  Made better by staying quiet and drinking cold water.  She not able to talk for a long time and zoom meetings.  This makes her cough and want to reach out to water.  Her voice gets hoarse.  Its a dry cough.  There is no wheezing or shortness of breath.    Cough related issue  - she has seen ENT and apparently vocal cords are red.  Unclear if she has acid  reflux.  - Of note few to several years ago she had thyroidectomy without any problems.  A few years ago she had intubation for the procedure and after that she has not been able to sing  -Unclear if she has acid reflux  -She reports no asthma but has seasonal allergies.  Previous blood eosinophils normal.  Today nitric oxide  9 ppb in and normal  -She does have hypertension on losartan .  Previously had cough with lisinopril.    Results for Katelyn [name] (MRN [id number]) as of 04/07/2021 09:52  Ref. Range 10/29/2019 10:05  Eosinophils Absolute Latest Ref Range: 0.0 - 0.5 K/uL 0.15 Oct 2021   Pt. Presents for follow up. She was seen 10/02/2021 for cough with wheezing and viral symptoms after sick exposures. CXR at the time showed early edema vs atypical infection. She was started on a prednisone  taper, Albuterol  nebs and Augmentin  in addition to  hydromet cough syrup. She presents today for evaluation of treatment. Pt. States she has been doing better since treatment. She states she completed the Augmentin  and prednisone  taper. She has been using the albuterol  nebs. She is better. She feels she has returned to baseline. She feels her voice is getting back to her baseline strength.  She will have follow up HRCT in 04/2022 with follow up after.   Test Results: CXR 10/02/2021>> Acute visit Interlobular septal thickening suggests early edema, though atypical infection could have this appearance  05/03/2021 HRCT Chest Mild patchy subpleural reticulation and ground-glass opacity in both lungs, asymmetrically prominent in the left lung. No traction bronchiectasis or frank honeycombing. Findings could be due to nonspecific postinfectious/postinflammatory scarring, with an interstitial lung disease such as nonspecific interstitial pneumonia (NSIP) or early usual interstitial pneumonia   04/2021 Labs : Previous visit for ? ILD vs auto immune work up. Serology (including rheumatoid factor, ANA,  scleroderma, TB, Sjogrens were negative) Eosinophils and IgE were somewhat elevated.   OV 09/25/2022  Subjective:  Patient ID: Katelyn [name], female , DOB: 1956-07-02 , age 68 y.o. , MRN: [id number] , ADDRESS: [address] [place] [address] PCP Masneri, Clotilda HERO, DO Patient Care Team: Cindy Clotilda HERO, DO as PCP - General (Family Medicine) Geronimo Amel, MD as Consulting Physician (Pulmonary Disease)  This Provider for this visit: Treatment Team:  Attending Provider: Geronimo Amel, MD    09/25/2022 -   Chief Complaint  Patient presents with   Follow-up    Pt states she has been doing good since last visit and states she is not coughing anymore.     HPI Katelyn [name] 68 y.o. -returns for follow-up.  I personally saw her 18 months ago.  After that she see nurse practitioners.  At this point in time the cough is nearly resolved.  She only has occasional cough when she gets exposed to dust or cold air at [employer].  She feels it is slightly abnormal compared to her peers but it still at a point where she would consider herself nearly asymptomatic.  She did have CT scan of the chest last summer and again this summer.  There is early interstitial abnormalities but not fitting the pattern of interstitial lung disease.  1 year scan is recommended for summer 2024.  Patient denying any shortness of breath.  In fact symptoms are better.  I discussed with them that she is okay postponing a follow-up CT scan from July 2024 to September 2024.       HRCT July 2023   IMPRESSION: 1. Minimal, irregular ground-glass and interstitial opacity in the bilateral lung bases, again more notable on the left. Findings are nonspecific and generally favored to reflect sequelae of prior infection or aspiration, however minimal, early fibrotic interstitial lung disease is not strictly excluded. Consider ongoing annual surveillance if there is high, persistent clinical  suspicion for fibrotic interstitial lung disease. Findings are indeterminate for UIP per consensus guidelines: Diagnosis of Idiopathic Pulmonary Fibrosis: An Official ATS/ERS/JRS/ALAT Clinical Practice Guideline. Am JINNY Honey Crit Care Med Vol 198, Iss 5, [phone], Jun 15 2017. 2. Mild, diffuse bilateral bronchial wall thickening, consistent with nonspecific infectious or inflammatory bronchitis. 3. Enlargement of the main pulmonary artery, as can be seen in pulmonary hypertension. 4. Coronary artery disease.   Aortic Atherosclerosis (ICD10-I70.0).     Electronically Signed   By: Marolyn JONETTA Jaksch M.D.   On: 05/08/2022 14:03   OV 11/22/2023  Subjective:  Patient ID: Katelyn [name], female , DOB: 1956/02/07 , age 20 y.o. , MRN: [id number] , ADDRESS: [address] [place] [address] PCP Masneri, Clotilda HERO, DO Patient Care Team: Cindy Clotilda HERO, DO as PCP - General (Family Medicine) Geronimo Amel, MD as Consulting Physician (Pulmonary Disease)  This Provider for this visit:  Treatment Team:  Attending Provider: Geronimo Amel, MD    11/22/2023 -   Chief Complaint  Patient presents with   Follow-up    Pt states no new concerns at this time     HPI Katelyn [name] 68 y.o. -returns for annual follow-up.  Last CT scan for concern of ILD was in September 2024.  Personally visualized it and compared to the previous 1 to me the lower lobe groundglass opacities remain consistent with ILA but Dr. Jeremy has just called the imaging is air trapping.  Nevertheless we both agree the stable and no change.  She herself feels stable except the fact she has gained weight because of stress her son is going through divorce and being off Wegovy .  She also still complains of mild intermittent cough when she gets exposed to cologne dust or dust in the air duct or occasionally when she lies down it is mild and intermittent.  I did indicate to her with air trapping this is consistent with  airway problem and we took a shared decision making for now just with albuterol  would be fine.  We also decided that because she is a non-smoker we will just see her in 1 year with a pulmonary function test and set of imaging.  She is okay with this plan.      Dr Felice Reflux Symptom Index (> 13-15 suggestive of LPR cough) 0 -> 5  =  none ->severe problem  Hoarseness of problem with voice 3  Clearing  Of Throat 4  Excess throat mucus or feeling of post nasal drip 3  Difficulty swallowing food, liquid or tablets 0  Cough after eating or lying down 5  Breathing difficulties or choking episodes 0  Troublesome or annoying cough 5  Sensation of something sticking in throat or lump in throat 5  Heartburn, chest pain, indigestion, or stomach acid coming up 1  TOTAL 26     Narrative & Impression  CLINICAL DATA:  Pulmonary nodule, chronic cough, pulmonary hypertension.   EXAM: CT CHEST WITHOUT CONTRAST   TECHNIQUE: Multidetector CT imaging of the chest was performed following the standard protocol without intravenous contrast. High resolution imaging of the lungs, as well as inspiratory and expiratory imaging, was performed.   RADIATION DOSE REDUCTION: This exam was performed according to the departmental dose-optimization program which includes automated exposure control, adjustment of the mA and/or kV according to patient size and/or use of iterative reconstruction technique.   COMPARISON:  05/07/2022 and 05/03/2021.   FINDINGS: Cardiovascular: Atherosclerotic calcification of the aorta and left anterior descending coronary artery. Heart is enlarged. No pericardial effusion.   Mediastinum/Nodes: Thyroidectomy. No pathologically enlarged mediastinal or axillary lymph nodes. Hilar regions are difficult to definitively evaluate without IV contrast. Esophagus is grossly unremarkable.   Lungs/Pleura: Negative for subpleural reticulation,  traction bronchiectasis/bronchiolectasis, ground glass, architectural distortion or honeycombing. Mild basilar pleuroparenchymal scarring. No pleural fluid. Airway is unremarkable. Minimal air trapping.   Upper Abdomen: Visualized portions of the liver, gallbladder, adrenal glands, kidneys, spleen, pancreas, stomach and bowel are grossly unremarkable. No upper abdominal adenopathy.   Musculoskeletal: Degenerative changes in the spine. No worrisome lytic or sclerotic lesions.   IMPRESSION: 1. No evidence of interstitial lung disease. Minimal air trapping is indicative of small airways disease. 2. Aortic atherosclerosis (ICD10-I70.0). Left anterior descending coronary artery calcification.     Electronically Signed   By: Newell Jeremy M.D.   On: 07/01/2023 11:13   PFT     Latest  Ref Rng & Units 11/21/2023    3:30 PM  ILD indicators  FVC-Pre L 2.55  P  FVC-Predicted Pre % 84  P  FVC-Post L 2.56  P  FVC-Predicted Post % 85  P  TLC L 4.66  P  TLC Predicted % 94  P  DLCO uncorrected ml/min/mmHg 19.17  P  DLCO UNC %Pred % 100  P  DLCO Corrected ml/min/mmHg 19.17  P  DLCO COR %Pred % 100  P    P Preliminary result      LAB RESULTS last 96 hours No results found.      has a past medical history of Cancer (HCC), Hypertension, Hypothyroidism, PONV (postoperative nausea and vomiting), Pre-diabetes, S/P laparoscopic assisted vaginal hysterectomy (LAVH) (02/26/2018), Seasonal allergies, and SVD (spontaneous vaginal delivery).   reports that she has never smoked. She has never used smokeless tobacco.  Past Surgical History:  Procedure Laterality Date   ANTERIOR AND POSTERIOR REPAIR N/A 02/26/2018   Procedure: ANTERIOR (CYSTOCELE) AND POSTERIOR REPAIR (RECTOCELE);  Surgeon: Danielle Rom, MD;  Location: [facility];  Service: Gynecology;  Laterality: N/A;   COLONOSCOPY     LAPAROSCOPIC VAGINAL HYSTERECTOMY WITH SALPINGECTOMY Bilateral 02/26/2018   Procedure: LAPAROSCOPIC  ASSISTED VAGINAL HYSTERECTOMY WITH SALPINGECTOMY;  Surgeon: Danielle Rom, MD;  Location: [facility];  Service: Gynecology;  Laterality: Bilateral;   TONSILLECTOMY     TOTAL THYROIDECTOMY     in [place]   WISDOM TOOTH EXTRACTION      Allergies  Allergen Reactions   Lisinopril Cough   Sulfa Antibiotics Rash    Immunization History  Administered Date(s) Administered   Hepatitis B, ADULT 04/22/1985, 05/19/1985, 10/22/1985   Influenza Whole 07/16/2011, 07/23/2012, 08/03/2013, 07/06/2014   Influenza,inj,quad, With Preservative 07/25/2015   MMR 11/03/2003   PFIZER(Purple Top)SARS-COV-2 Vaccination 10/14/2019, 11/04/2019, 08/03/2020   Tdap 03/10/2008, 05/28/2016   Zoster, Live 10/22/2018, 03/18/2019    Family History  Problem Relation Age of Onset   Heart block Mother    Heart attack Father    Heart attack Sister      Current Outpatient Medications:    albuterol  (PROVENTIL ) (2.5 MG/3ML) 0.083% nebulizer solution, Take 3 mLs (2.5 mg total) by nebulization every 6 (six) hours as needed for wheezing or shortness of breath., Disp: 75 mL, Rfl: 12   albuterol  (VENTOLIN  HFA) 108 (90 Base) MCG/ACT inhaler, Inhale 1 puff into the lungs every 4 (four) hours as needed., Disp: 6.7 g, Rfl: 1   aspirin  EC 81 MG tablet, Take 1 tablet (81 mg total) by mouth daily. Swallow whole., Disp: 90 tablet, Rfl: 3   atorvastatin  (LIPITOR) 10 MG tablet, Take 1 tablet (10 mg total) by mouth daily., Disp: 90 tablet, Rfl: 0   brompheniramine-pseudoephedrine-DM 30-2-10 MG/5ML syrup, Take 5 mLs by mouth 4 (four) times daily as needed., Disp: 120 mL, Rfl: 0   Calcium  Carb-Cholecalciferol (CALCIUM +D3 PO), Take 1 tablet by mouth at bedtime., Disp: , Rfl:    escitalopram  (LEXAPRO ) 10 MG tablet, Take 1 tablet (10 mg total) by mouth daily., Disp: 90 tablet, Rfl: 3   levothyroxine  (SYNTHROID ) 112 MCG tablet, Take 1 tablet (112 mcg total) by mouth in the morning on an empty stomach., Disp: 90 tablet, Rfl: 4    losartan -hydrochlorothiazide  (HYZAAR ) 100-25 MG tablet, Take 1 tablet by mouth daily., Disp: 90 tablet, Rfl: 3   metFORMIN  (GLUCOPHAGE -XR) 500 MG 24 hr tablet, Take 4 tablets (2,000 mg total) by mouth daily., Disp: 360 tablet, Rfl: 3   montelukast  (SINGULAIR ) 10 MG tablet, Take  1 tablet (10 mg total) by mouth at bedtime., Disp: 90 tablet, Rfl: 1   omeprazole  (PRILOSEC) 20 MG capsule, Take 1 capsule (20 mg total) by mouth daily., Disp: 90 capsule, Rfl: 0   benzonatate  (TESSALON ) 100 MG capsule, Take 1 capsule (100 mg total) by mouth 3 (three) times daily as needed for cough. (Patient not taking: Reported on 11/22/2023), Disp: 20 capsule, Rfl: 0   fluticasone  (FLONASE ) 50 MCG/ACT nasal spray, Place 2 sprays into both nostrils daily. (Patient not taking: Reported on 11/22/2023), Disp: 16 g, Rfl: 0   fluticasone  (FLONASE ) 50 MCG/ACT nasal spray, Place 1 spray into both nostrils daily. (Patient not taking: Reported on 11/22/2023), Disp: 16 g, Rfl: 1   levothyroxine  (SYNTHROID ) 112 MCG tablet, Take 1 tablet (112 mcg total) by mouth in the morning on an empty stomach. (Patient not taking: Reported on 11/22/2023), Disp: 90 tablet, Rfl: 4   levothyroxine  (SYNTHROID ) 112 MCG tablet, Take 1 tablet (112 mcg total) by mouth daily. (Patient not taking: Reported on 11/22/2023), Disp: 90 tablet, Rfl: 0   losartan -hydrochlorothiazide  (HYZAAR ) 100-12.5 MG tablet, Take 1 tablet by mouth daily. (Patient not taking: Reported on 11/22/2023), Disp: 90 tablet, Rfl: 0   Semaglutide -Weight Management (WEGOVY ) 0.25 MG/0.5ML SOAJ, inject 0.25mg  Subcutaneous weekly for 4 weeks , then increase to 0.5mg  weekly (Patient not taking: Reported on 11/22/2023), Disp: 2 mL, Rfl: 0   Semaglutide -Weight Management (WEGOVY ) 0.5 MG/0.5ML SOAJ, Inject 0.5 mg into the skin once a week for 4 weeks,then increase to 1mg . (Patient not taking: Reported on 11/22/2023), Disp: 0.5 mL, Rfl: 3   Semaglutide -Weight Management (WEGOVY ) 2.4 MG/0.75ML SOAJ, Inject 2.4 mg into  the skin once a week. (Patient not taking: Reported on 11/22/2023), Disp: 3 mL, Rfl: 11      Objective:   Vitals:   11/22/23 0952  BP: 138/74  Pulse: 72  SpO2: 96%  Weight: 253 lb 12.8 oz (115.1 kg)  Height: 5' 3 (1.6 m)    Estimated body mass index is 44.96 kg/m as calculated from the following:   Height as of this encounter: 5' 3 (1.6 m).   Weight as of this encounter: 253 lb 12.8 oz (115.1 kg).  @WEIGHTCHANGE @  [employer]   11/22/23 0952  Weight: 253 lb 12.8 oz (115.1 kg)     Physical Exam   General: No distress. Looks wel O2 at rest: no Cane present: no Sitting in wheel chair: no Frail: no Obese: yes Neuro: Alert and Oriented x 3. GCS 15. Speech normal Psych: Pleasant Resp:  Barrel Chest - no.  Wheeze - no, Crackles - no, No overt respiratory distress CVS: Normal heart sounds. Murmurs - no Ext: Stigmata of Connective Tissue Disease - no HEENT: Normal upper airway. PEERL +. No post nasal drip        Assessment:       ICD-10-CM   1. Chronic cough  R05.3     2. Opacity of lung on imaging study  R91.8          Plan:     Patient Instructions     ICD-10-CM   1. Chronic cough  R05.3     2. Opacity of lung on imaging study  R91.8        Cough/not a significant problem but a mild problem - could be airway irritability like asthma  Last CT scan of the chest July 2023 and Sept 2024 with subtle abnormalities of what is called ILA; interstitial lung abnormalities - but no change.  -  overall good outlook but needs monitoring  Plan -Full pulmonary function test feb 2026  Follow-up - Return in 1 year feb 2026; 15 min after PFT   FOLLOWUP Return in about 1 year (around 11/21/2024) for 15 min visit, with Dr Geronimo, Face to Face Visit after PFT.    SIGNATURE    Dr. Dorethia Geronimo, M.D., F.C.C.P,  Pulmonary and Critical Care Medicine Staff Physician, [facility] Director - Interstitial Lung Disease  Program  Pulmonary  Fibrosis [facility] at [facility] [place], [place], [address]  Pager: [phone], If no answer or between  15:00h - 7:00h: call [phone] Telephone: [phone]  10:28 AM 11/22/2023

## 2023-12-05 ENCOUNTER — Other Ambulatory Visit: Payer: Self-pay

## 2023-12-05 DIAGNOSIS — I2510 Atherosclerotic heart disease of native coronary artery without angina pectoris: Secondary | ICD-10-CM | POA: Diagnosis not present

## 2023-12-05 DIAGNOSIS — C73 Malignant neoplasm of thyroid gland: Secondary | ICD-10-CM | POA: Diagnosis not present

## 2023-12-05 DIAGNOSIS — R053 Chronic cough: Secondary | ICD-10-CM | POA: Diagnosis not present

## 2023-12-05 DIAGNOSIS — R7303 Prediabetes: Secondary | ICD-10-CM | POA: Diagnosis not present

## 2023-12-05 DIAGNOSIS — H34231 Retinal artery branch occlusion, right eye: Secondary | ICD-10-CM | POA: Diagnosis not present

## 2023-12-05 DIAGNOSIS — F32A Depression, unspecified: Secondary | ICD-10-CM | POA: Diagnosis not present

## 2023-12-05 DIAGNOSIS — E890 Postprocedural hypothyroidism: Secondary | ICD-10-CM | POA: Diagnosis not present

## 2023-12-05 DIAGNOSIS — I10 Essential (primary) hypertension: Secondary | ICD-10-CM | POA: Diagnosis not present

## 2023-12-05 DIAGNOSIS — E782 Mixed hyperlipidemia: Secondary | ICD-10-CM | POA: Diagnosis not present

## 2023-12-09 ENCOUNTER — Other Ambulatory Visit (HOSPITAL_COMMUNITY): Payer: Self-pay

## 2023-12-09 ENCOUNTER — Other Ambulatory Visit: Payer: Self-pay | Admitting: Internal Medicine

## 2023-12-09 ENCOUNTER — Other Ambulatory Visit: Payer: Self-pay

## 2023-12-09 ENCOUNTER — Other Ambulatory Visit: Payer: Self-pay | Admitting: Primary Care

## 2023-12-09 MED ORDER — ATORVASTATIN CALCIUM 10 MG PO TABS
10.0000 mg | ORAL_TABLET | Freq: Every day | ORAL | 1 refills | Status: DC
  Filled 2023-12-09: qty 90, 90d supply, fill #0
  Filled 2024-03-04: qty 90, 90d supply, fill #1

## 2023-12-13 ENCOUNTER — Other Ambulatory Visit (HOSPITAL_COMMUNITY): Payer: Self-pay

## 2023-12-13 MED ORDER — OMEPRAZOLE 20 MG PO CPDR
20.0000 mg | DELAYED_RELEASE_CAPSULE | Freq: Every day | ORAL | 1 refills | Status: DC
  Filled 2023-12-13: qty 90, 90d supply, fill #0
  Filled 2024-03-16: qty 90, 90d supply, fill #1

## 2023-12-13 MED ORDER — MONTELUKAST SODIUM 10 MG PO TABS
10.0000 mg | ORAL_TABLET | Freq: Every day | ORAL | 1 refills | Status: DC
  Filled 2023-12-13: qty 90, 90d supply, fill #0
  Filled 2024-03-16: qty 90, 90d supply, fill #1

## 2023-12-16 DIAGNOSIS — L718 Other rosacea: Secondary | ICD-10-CM | POA: Diagnosis not present

## 2023-12-16 DIAGNOSIS — L821 Other seborrheic keratosis: Secondary | ICD-10-CM | POA: Diagnosis not present

## 2023-12-16 DIAGNOSIS — D2272 Melanocytic nevi of left lower limb, including hip: Secondary | ICD-10-CM | POA: Diagnosis not present

## 2023-12-16 DIAGNOSIS — L853 Xerosis cutis: Secondary | ICD-10-CM | POA: Diagnosis not present

## 2023-12-16 DIAGNOSIS — D2262 Melanocytic nevi of left upper limb, including shoulder: Secondary | ICD-10-CM | POA: Diagnosis not present

## 2023-12-16 DIAGNOSIS — L720 Epidermal cyst: Secondary | ICD-10-CM | POA: Diagnosis not present

## 2023-12-16 DIAGNOSIS — D2361 Other benign neoplasm of skin of right upper limb, including shoulder: Secondary | ICD-10-CM | POA: Diagnosis not present

## 2023-12-16 DIAGNOSIS — D225 Melanocytic nevi of trunk: Secondary | ICD-10-CM | POA: Diagnosis not present

## 2023-12-16 DIAGNOSIS — L570 Actinic keratosis: Secondary | ICD-10-CM | POA: Diagnosis not present

## 2023-12-16 DIAGNOSIS — D1801 Hemangioma of skin and subcutaneous tissue: Secondary | ICD-10-CM | POA: Diagnosis not present

## 2023-12-26 ENCOUNTER — Other Ambulatory Visit (HOSPITAL_COMMUNITY): Payer: Self-pay

## 2023-12-26 ENCOUNTER — Other Ambulatory Visit: Payer: Self-pay | Admitting: Family Medicine

## 2023-12-26 ENCOUNTER — Encounter (HOSPITAL_COMMUNITY): Payer: Self-pay

## 2023-12-26 DIAGNOSIS — J3089 Other allergic rhinitis: Secondary | ICD-10-CM

## 2023-12-26 DIAGNOSIS — J069 Acute upper respiratory infection, unspecified: Secondary | ICD-10-CM

## 2023-12-26 MED ORDER — ALBUTEROL SULFATE HFA 108 (90 BASE) MCG/ACT IN AERS
1.0000 | INHALATION_SPRAY | RESPIRATORY_TRACT | 1 refills | Status: DC | PRN
  Filled 2023-12-26: qty 6.7, 30d supply, fill #0
  Filled 2024-01-29: qty 6.7, 17d supply, fill #1

## 2024-01-07 ENCOUNTER — Other Ambulatory Visit: Payer: Self-pay | Admitting: Obstetrics and Gynecology

## 2024-01-07 DIAGNOSIS — Z1231 Encounter for screening mammogram for malignant neoplasm of breast: Secondary | ICD-10-CM

## 2024-01-21 ENCOUNTER — Other Ambulatory Visit (HOSPITAL_COMMUNITY): Payer: Self-pay

## 2024-01-21 ENCOUNTER — Telehealth: Admitting: Physician Assistant

## 2024-01-21 DIAGNOSIS — B9689 Other specified bacterial agents as the cause of diseases classified elsewhere: Secondary | ICD-10-CM | POA: Diagnosis not present

## 2024-01-21 DIAGNOSIS — J019 Acute sinusitis, unspecified: Secondary | ICD-10-CM | POA: Diagnosis not present

## 2024-01-21 MED ORDER — AMOXICILLIN-POT CLAVULANATE 875-125 MG PO TABS
1.0000 | ORAL_TABLET | Freq: Two times a day (BID) | ORAL | 0 refills | Status: DC
  Filled 2024-01-21: qty 14, 7d supply, fill #0

## 2024-01-21 NOTE — Progress Notes (Signed)

## 2024-01-22 ENCOUNTER — Ambulatory Visit
Admission: RE | Admit: 2024-01-22 | Discharge: 2024-01-22 | Disposition: A | Discharge status: Home / Self Care | Source: Ambulatory Visit | Attending: Obstetrics and Gynecology | Admitting: Obstetrics and Gynecology

## 2024-01-22 DIAGNOSIS — Z1231 Encounter for screening mammogram for malignant neoplasm of breast: Secondary | ICD-10-CM

## 2024-01-28 ENCOUNTER — Other Ambulatory Visit (HOSPITAL_BASED_OUTPATIENT_CLINIC_OR_DEPARTMENT_OTHER): Payer: Self-pay

## 2024-01-28 ENCOUNTER — Telehealth: Payer: Self-pay

## 2024-01-28 ENCOUNTER — Ambulatory Visit: Payer: Self-pay | Admitting: Internal Medicine

## 2024-01-28 ENCOUNTER — Ambulatory Visit
Admission: EM | Admit: 2024-01-28 | Discharge: 2024-01-28 | Disposition: A | Discharge status: Home / Self Care | Attending: Family Medicine | Admitting: Family Medicine

## 2024-01-28 ENCOUNTER — Ambulatory Visit (INDEPENDENT_AMBULATORY_CARE_PROVIDER_SITE_OTHER)

## 2024-01-28 DIAGNOSIS — R059 Cough, unspecified: Secondary | ICD-10-CM

## 2024-01-28 DIAGNOSIS — R0602 Shortness of breath: Secondary | ICD-10-CM | POA: Diagnosis not present

## 2024-01-28 MED ORDER — PREDNISONE 20 MG PO TABS
60.0000 mg | ORAL_TABLET | Freq: Every day | ORAL | 0 refills | Status: DC
  Filled 2024-01-28: qty 15, 5d supply, fill #0

## 2024-01-28 MED ORDER — METHYLPREDNISOLONE SODIUM SUCC 125 MG IJ SOLR
125.0000 mg | Freq: Once | INTRAMUSCULAR | Status: AC
  Administered 2024-01-28: 125 mg via INTRAMUSCULAR

## 2024-01-28 NOTE — Telephone Encounter (Signed)
 Sarah, please advise. MR is unavailable.

## 2024-01-28 NOTE — ED Triage Notes (Signed)
 [facility] with [facility] of chest tightness and chest congestion. Pt reports recent antibiotic for sinus infection and albuterol and nebulizer's with minimal improvement. Pt has also been on a prednisone taper. Pt is concerned for pneumonia. Pulmonologist booked up.

## 2024-01-28 NOTE — Telephone Encounter (Signed)
 Spoke to patient and relayed below message/recommendations. She stated that she was seen at [facility] today and tx with prednisone and medrol.  Nothing further needed.

## 2024-01-28 NOTE — Discharge Instructions (Addendum)
 Advised patient to take medication as directed with food to completion.  Encouraged to increase daily water intake to 64 ounces per day while taking this medication.  Advised if symptoms worsen and/or unresolved please follow-up with your PCP or here for further evaluation.

## 2024-01-28 NOTE — Telephone Encounter (Signed)
 E2C2 Pulmonary Triage - Initial Assessment Questions "Chief Complaint (e.g., cough, sob, wheezing, fever, chills, sweat or additional symptoms) *Go to specific symptom protocol after initial questions. Patient calling with concerns for shortness of breath with cough. Patient has a hx of chronic cough. She was diagnosed with sinus infection about a week ago and was placed on Augmentin. started on prednisone taper on Sunday-due to take Prednisone 40 mg today but hasn't taken it yet. had a bronchospasm before breathing treatment last night and another bronchospasm occurred after taking the treatment. Patient is concerned that she isn't getting better and that she might need another antibiotic. Patient is asking for office to please call her back with recommendations. Patient was given the option of Urgent Care but patient states she would rather wait on the office. Patient verbalized understanding and patient will await a phone call from office.   "How long have symptoms been present?" Started a week ago  Have you tested for COVID or Flu? Note: If not, ask patient if a home test can be taken. If so, instruct patient to call back for positive results. No  MEDICINES:   "Have you used any OTC meds to help with symptoms?" Yes If yes, ask "What medications?" Ibuprofen Delsym  "Have you used your inhalers/maintenance medication?" Yes If yes, "What medications?" Albuterol inhaler  If inhaler, ask "How many puffs and how often?" Note: Review instructions on medication in the chart. 1 puff every 4 hours  OXYGEN: "Do you wear supplemental oxygen?" No If yes, "How many liters are you supposed to use?"   "Do you monitor your oxygen levels?" Yes If yes, "What is your reading (oxygen level) today?" 95  "What is your usual oxygen saturation reading?"  (Note: Pulmonary O2 sats should be 90% or greater) 95-98%   Copied from CRM [id number]. Topic: Clinical - Red Word Triage >> Jan 28, 2024 11:01 AM  Justina Oman C wrote: Red Word that prompted transfer to Nurse Triage: Patient [phone] has a sinus infection and had a broncho spasms last night, shortness of breath even with inhaler. Patient denies pain, nor dizziness. Patient wants to be seen today instead of urgent care. Please advise. Reason for Disposition  [1] MILD difficulty breathing (e.g., minimal/no SOB at rest, SOB with walking, pulse <100) AND [2] NEW-onset or WORSE than normal  Answer Assessment - Initial Assessment Questions 1. RESPIRATORY STATUS: "Describe your breathing?" (e.g., wheezing, shortness of breath, unable to speak, severe coughing)      Shortness of breath, some wheezing, coughing 2. ONSET: "When did this breathing problem begin?"      Initial symptoms started a couple of weeks ago but was diagnosed with sinus infection a week ago 3. PATTERN "Does the difficult breathing come and go, or has it been constant since it started?"      Comes and goes 4. SEVERITY: "How bad is your breathing?" (e.g., mild, moderate, severe)    - MILD: No SOB at rest, mild SOB with walking, speaks normally in sentences, can lie down, no retractions, pulse < 100.    - MODERATE: SOB at rest, SOB with minimal exertion and prefers to sit, cannot lie down flat, speaks in phrases, mild retractions, audible wheezing, pulse 100-120.    - SEVERE: Very SOB at rest, speaks in single words, struggling to breathe, sitting hunched forward, retractions, pulse > 120      Mild-moderate 5. RECURRENT SYMPTOM: "Have you had difficulty breathing before?" If Yes, ask: "When was the last time?" and "What  happened that time?"      Yes but has been a while 6. CARDIAC HISTORY: "Do you have any history of heart disease?" (e.g., heart attack, angina, bypass surgery, angioplasty)      No 7. LUNG HISTORY: "Do you have any history of lung disease?"  (e.g., pulmonary embolus, asthma, emphysema)     Chronic cough 8. CAUSE: "What do you think is causing the breathing  problem?"      Patient is concerned for possible PNA 9. OTHER SYMPTOMS: "Do you have any other symptoms? (e.g., dizziness, runny nose, cough, chest pain, fever)     cough 10. O2 SATURATION MONITOR:  "Do you use an oxygen saturation monitor (pulse oximeter) at home?" If Yes, ask: "What is your reading (oxygen level) today?" "What is your usual oxygen saturation reading?" (e.g., 95%)       95% 12. TRAVEL: "Have you traveled out of the country in the last month?" (e.g., travel history, exposures)       No  Protocols used: Breathing Difficulty-A-AH

## 2024-01-28 NOTE — ED Provider Notes (Signed)
 [facility]    CSN: [id number] Arrival date & time: 01/28/24  1213      History   Chief Complaint Chief Complaint  Patient presents with   Chest Pain   Shortness of Breath    Chest tightness.     HPI Katelyn [name] is a 68 y.o. female.   HPI 68 year old female presents with chest pain, shortness of breath and chest tightness for several days.  Patient reports being placed on antibiotic (Augmentin) at 01/21/2024 ED visit for acute bacterial sinusitis.  Patient request chest x-ray as she is concern for pneumonia.  Patient reports cough has worsened and cannot get in with her pulmonologist.  PMH significant for thyroid cancer, chronic cough, and HTN.  Past Medical History:  Diagnosis Date   Cancer (HCC)    thyroid cancer   Hypertension    Hypothyroidism    PONV (postoperative nausea and vomiting)    Pre-diabetes    S/P laparoscopic assisted vaginal hysterectomy (LAVH) 02/26/2018   Seasonal allergies    SVD (spontaneous vaginal delivery)    x 2    Patient Active Problem List   Diagnosis Date Noted   Chronic cough 05/08/2021   Pulmonary hypertension (HCC) 05/08/2021    Class: Question of   Left lower lobe pulmonary nodule 05/08/2021   S/P laparoscopic assisted vaginal hysterectomy (LAVH) 02/26/2018   Cystocele and rectocele with incomplete uterovaginal prolapse 02/26/2018    Past Surgical History:  Procedure Laterality Date   ANTERIOR AND POSTERIOR REPAIR N/A 02/26/2018   Procedure: ANTERIOR (CYSTOCELE) AND POSTERIOR REPAIR (RECTOCELE);  Surgeon: Sherian Rein, MD;  Location: [facility];  Service: Gynecology;  Laterality: N/A;   COLONOSCOPY     LAPAROSCOPIC VAGINAL HYSTERECTOMY WITH SALPINGECTOMY Bilateral 02/26/2018   Procedure: LAPAROSCOPIC ASSISTED VAGINAL HYSTERECTOMY WITH SALPINGECTOMY;  Surgeon: Sherian Rein, MD;  Location: [facility];  Service: Gynecology;  Laterality: Bilateral;   TONSILLECTOMY     TOTAL THYROIDECTOMY     in [place]   WISDOM TOOTH  EXTRACTION      OB History   No obstetric history on file.      Home Medications    Prior to Admission medications   Medication Sig Start Date End Date Taking? Authorizing Provider  predniSONE (DELTASONE) 20 MG tablet Take 3 tablets (60 mg total) by mouth daily for 5 days. 01/28/24 02/02/24 Yes Trevor Iha, FNP  albuterol (PROVENTIL) (2.5 MG/3ML) 0.083% nebulizer solution Take 3 mLs (2.5 mg total) by nebulization every 6 (six) hours as needed for wheezing or shortness of breath. 10/02/21   Bevelyn Ngo, NP  albuterol (VENTOLIN HFA) 108 (90 Base) MCG/ACT inhaler Inhale 1 puff into the lungs every 4 (four) hours as needed. 12/26/23     amoxicillin-clavulanate (AUGMENTIN) 875-125 MG tablet Take 1 tablet by mouth 2 (two) times daily. 01/21/24   Margaretann Loveless, PA-C  aspirin EC 81 MG tablet Take 1 tablet (81 mg total) by mouth daily. Swallow whole. 07/10/22   Lyn Records, MD  atorvastatin (LIPITOR) 10 MG tablet Take 1 tablet (10 mg total) by mouth daily. 12/09/23     benzonatate (TESSALON) 100 MG capsule Take 1 capsule (100 mg total) by mouth 3 (three) times daily as needed for cough. Patient not taking: Reported on 11/22/2023 01/17/23   Freddy Finner, NP  brompheniramine-pseudoephedrine-DM 30-2-10 MG/5ML syrup Take 5 mLs by mouth 4 (four) times daily as needed. 01/17/23   Freddy Finner, NP  Calcium Carb-Cholecalciferol (CALCIUM+D3 PO) Take 1 tablet by mouth  at bedtime.    [provider]  escitalopram (LEXAPRO) 10 MG tablet Take 1 tablet (10 mg total) by mouth daily. 09/18/23     fluticasone (FLONASE) 50 MCG/ACT nasal spray Place 2 sprays into both nostrils daily. Patient not taking: Reported on 11/22/2023 01/17/23   Freddy Finner, NP  fluticasone [facility]) 50 MCG/ACT nasal spray Place 1 spray into both nostrils daily. Patient not taking: Reported on 11/22/2023 01/17/23     levothyroxine (SYNTHROID) 112 MCG tablet Take 1 tablet (112 mcg total) by mouth in the morning on an empty  stomach. Patient not taking: Reported on 11/22/2023 12/07/22     levothyroxine (SYNTHROID) 112 MCG tablet Take 1 tablet (112 mcg total) by mouth daily. Patient not taking: Reported on 11/22/2023 09/16/23     levothyroxine (SYNTHROID) 112 MCG tablet Take 1 tablet (112 mcg total) by mouth every morning on an empty stomach. 11/22/23     losartan-hydrochlorothiazide (HYZAAR) 100-12.5 MG tablet Take 1 tablet by mouth daily. Patient not taking: Reported on 11/22/2023 09/16/23     losartan-hydrochlorothiazide (HYZAAR) 100-25 MG tablet Take 1 tablet by mouth daily. 09/18/23     metFORMIN (GLUCOPHAGE-XR) 500 MG 24 hr tablet Take 4 tablets (2,000 mg total) by mouth daily. 05/21/23     montelukast (SINGULAIR) 10 MG tablet Take 1 tablet (10 mg total) by mouth at bedtime. 12/13/23   Kalman Shan, MD  omeprazole (PRILOSEC) 20 MG capsule Take 1 capsule (20 mg total) by mouth daily. 12/13/23   Glenford Bayley, NP    Family History Family History  Problem Relation Age of Onset   Heart block Mother    Heart attack Father    Heart attack Sister     Social History Social History   Tobacco Use   Smoking status: Never   Smokeless tobacco: Never  Vaping Use   Vaping status: Never Used  Substance Use Topics   Alcohol use: Never   Drug use: Never     Allergies   Lisinopril and Sulfa antibiotics   Review of Systems Review of Systems  Respiratory:  Positive for cough, chest tightness and shortness of breath.   All other systems reviewed and are negative.    Physical Exam Triage Vital Signs ED Triage Vitals  Encounter Vitals Group     BP 01/28/24 1231 129/74     Systolic BP Percentile --      Diastolic BP Percentile --      Pulse Rate 01/28/24 1231 98     Resp 01/28/24 1231 16     Temp 01/28/24 1231 98.1 F (36.7 C)     Temp src --      SpO2 01/28/24 1231 (S) 90 %     Weight --      Height --      Head Circumference --      Peak Flow --      Pain Score 01/28/24 1229 0     Pain Loc --       Pain Education --      Exclude from Growth Chart --    No data found.  Updated Vital Signs BP 129/74   Pulse 98   Temp 98.1 F (36.7 C)   Resp 16   LMP  (LMP Unknown)   SpO2 (S) 90%   Physical Exam Vitals and nursing note reviewed.  Constitutional:      Appearance: Normal appearance. She is obese. She is ill-appearing.  HENT:     Head: Normocephalic  and atraumatic.     Mouth/Throat:     Mouth: Mucous membranes are moist.     Pharynx: Oropharynx is clear.  Eyes:     Extraocular Movements: Extraocular movements intact.     Conjunctiva/sclera: Conjunctivae normal.     Pupils: Pupils are equal, round, and reactive to light.  Cardiovascular:     Rate and Rhythm: Normal rate and regular rhythm.     Pulses: Normal pulses.     Heart sounds: Normal heart sounds.  Pulmonary:     Effort: Pulmonary effort is normal.     Breath sounds: Normal breath sounds. No wheezing, rhonchi or rales.     Comments: Diminished breath sounds noted throughout Chest:     Chest wall: No tenderness.  Musculoskeletal:        General: Normal range of motion.     Cervical back: Normal range of motion and neck supple.  Skin:    General: Skin is warm and dry.  Neurological:     General: No focal deficit present.     Mental Status: She is alert and oriented to person, place, and time. Mental status is at baseline.  Psychiatric:        Mood and Affect: Mood normal.        Behavior: Behavior normal.      UC Treatments / Results  Labs (all labs ordered are listed, but only abnormal results are displayed) Labs Reviewed - No data to display  EKG   Radiology DG Chest 2 View Result Date: 01/28/2024 CLINICAL DATA:  Shortness of breath EXAM: CHEST - 2 VIEW COMPARISON:  10/02/2021 FINDINGS: Mild interstitial prominence.  No focal airspace disease. Heart size and mediastinal contours are within normal limits. Aortic Atherosclerosis (ICD10-170.0). No effusion. Visualized bones unremarkable. IMPRESSION:  Mild interstitial prominence. No acute findings. Electronically Signed   By: Corlis Leak M.D.   On: 01/28/2024 15:29    Procedures Procedures (including critical care time)  Medications Ordered in UC Medications  methylPREDNISolone sodium succinate (SOLU-MEDROL) 125 mg/2 mL injection 125 mg (125 mg Intramuscular Given 01/28/24 1447)    Initial Impression / Assessment and Plan / UC Course  I have reviewed the triage vital signs and the nursing notes.  Pertinent labs & imaging results that were available during my care of the patient were reviewed by me and considered in my medical decision making (see chart for details).     MDM: 1.  Shortness of breath-IM Solu-Medrol 125 mg given once in clinic and prior to discharge, CXR results revealed above; 2.  Cough, unspecified type-CXR results revealed above Rx'd prednisone 20 mg tablet: Take 3 tablets p.o. daily x 5 days. Advised patient to take medication as directed with food to completion.  Encouraged to increase daily water intake to 64 ounces per day while taking this medication.  Advised if symptoms worsen and/or unresolved please follow-up with your PCP or here for further evaluation.  Patient discharged home, hemodynamically stable.  Final Clinical Impressions(s) / UC Diagnoses   Final diagnoses:  Shortness of breath  Cough, unspecified type     Discharge Instructions      Advised patient to take medication as directed with food to completion.  Encouraged to increase daily water intake to 64 ounces per day while taking this medication.  Advised if symptoms worsen and/or unresolved please follow-up with your PCP or here for further evaluation.     ED Prescriptions     Medication Sig Dispense Auth. Provider   predniSONE (  DELTASONE) 20 MG tablet Take 3 tablets (60 mg total) by mouth daily for 5 days. 15 tablet Adelae Yodice, FNP      PDMP not reviewed this encounter.   Leonides Ramp, FNP 01/28/24 [phone]

## 2024-01-29 ENCOUNTER — Encounter (HOSPITAL_BASED_OUTPATIENT_CLINIC_OR_DEPARTMENT_OTHER): Payer: Self-pay

## 2024-01-29 ENCOUNTER — Emergency Department (HOSPITAL_BASED_OUTPATIENT_CLINIC_OR_DEPARTMENT_OTHER)

## 2024-01-29 ENCOUNTER — Other Ambulatory Visit (HOSPITAL_BASED_OUTPATIENT_CLINIC_OR_DEPARTMENT_OTHER): Payer: Self-pay

## 2024-01-29 ENCOUNTER — Inpatient Hospital Stay (HOSPITAL_BASED_OUTPATIENT_CLINIC_OR_DEPARTMENT_OTHER)
Admission: EM | Admit: 2024-01-29 | Discharge: 2024-01-31 | DRG: 189 | Disposition: A | Discharge status: Home / Self Care | Attending: Internal Medicine | Admitting: Internal Medicine

## 2024-01-29 ENCOUNTER — Telehealth: Payer: Self-pay | Admitting: Internal Medicine

## 2024-01-29 ENCOUNTER — Other Ambulatory Visit: Payer: Self-pay | Admitting: Acute Care

## 2024-01-29 ENCOUNTER — Other Ambulatory Visit: Payer: Self-pay

## 2024-01-29 DIAGNOSIS — Z90710 Acquired absence of both cervix and uterus: Secondary | ICD-10-CM

## 2024-01-29 DIAGNOSIS — Z77098 Contact with and (suspected) exposure to other hazardous, chiefly nonmedicinal, chemicals: Secondary | ICD-10-CM | POA: Diagnosis present

## 2024-01-29 DIAGNOSIS — Z6841 Body Mass Index (BMI) 40.0 and over, adult: Secondary | ICD-10-CM

## 2024-01-29 DIAGNOSIS — Z8709 Personal history of other diseases of the respiratory system: Secondary | ICD-10-CM

## 2024-01-29 DIAGNOSIS — Z8619 Personal history of other infectious and parasitic diseases: Secondary | ICD-10-CM

## 2024-01-29 DIAGNOSIS — I10 Essential (primary) hypertension: Secondary | ICD-10-CM | POA: Diagnosis present

## 2024-01-29 DIAGNOSIS — Z7982 Long term (current) use of aspirin: Secondary | ICD-10-CM

## 2024-01-29 DIAGNOSIS — E860 Dehydration: Secondary | ICD-10-CM | POA: Diagnosis present

## 2024-01-29 DIAGNOSIS — Z9089 Acquired absence of other organs: Secondary | ICD-10-CM

## 2024-01-29 DIAGNOSIS — E876 Hypokalemia: Secondary | ICD-10-CM | POA: Diagnosis present

## 2024-01-29 DIAGNOSIS — J9601 Acute respiratory failure with hypoxia: Principal | ICD-10-CM | POA: Diagnosis present

## 2024-01-29 DIAGNOSIS — E890 Postprocedural hypothyroidism: Secondary | ICD-10-CM | POA: Diagnosis present

## 2024-01-29 DIAGNOSIS — J668 Airway disease due to other specific organic dusts: Secondary | ICD-10-CM | POA: Diagnosis not present

## 2024-01-29 DIAGNOSIS — K219 Gastro-esophageal reflux disease without esophagitis: Secondary | ICD-10-CM | POA: Diagnosis present

## 2024-01-29 DIAGNOSIS — J45901 Unspecified asthma with (acute) exacerbation: Secondary | ICD-10-CM | POA: Diagnosis present

## 2024-01-29 DIAGNOSIS — Z79890 Hormone replacement therapy (postmenopausal): Secondary | ICD-10-CM

## 2024-01-29 DIAGNOSIS — Z79899 Other long term (current) drug therapy: Secondary | ICD-10-CM

## 2024-01-29 DIAGNOSIS — Z882 Allergy status to sulfonamides status: Secondary | ICD-10-CM

## 2024-01-29 DIAGNOSIS — Z90722 Acquired absence of ovaries, bilateral: Secondary | ICD-10-CM

## 2024-01-29 DIAGNOSIS — Z8701 Personal history of pneumonia (recurrent): Secondary | ICD-10-CM

## 2024-01-29 DIAGNOSIS — E871 Hypo-osmolality and hyponatremia: Secondary | ICD-10-CM | POA: Diagnosis present

## 2024-01-29 DIAGNOSIS — J45909 Unspecified asthma, uncomplicated: Secondary | ICD-10-CM | POA: Diagnosis present

## 2024-01-29 DIAGNOSIS — Z8249 Family history of ischemic heart disease and other diseases of the circulatory system: Secondary | ICD-10-CM

## 2024-01-29 DIAGNOSIS — R059 Cough, unspecified: Secondary | ICD-10-CM | POA: Diagnosis not present

## 2024-01-29 DIAGNOSIS — I700 Atherosclerosis of aorta: Secondary | ICD-10-CM | POA: Diagnosis not present

## 2024-01-29 DIAGNOSIS — R0602 Shortness of breath: Secondary | ICD-10-CM | POA: Diagnosis not present

## 2024-01-29 DIAGNOSIS — Z1152 Encounter for screening for COVID-19: Secondary | ICD-10-CM

## 2024-01-29 DIAGNOSIS — J9811 Atelectasis: Secondary | ICD-10-CM | POA: Diagnosis present

## 2024-01-29 DIAGNOSIS — R7303 Prediabetes: Secondary | ICD-10-CM | POA: Diagnosis present

## 2024-01-29 DIAGNOSIS — R0989 Other specified symptoms and signs involving the circulatory and respiratory systems: Secondary | ICD-10-CM | POA: Diagnosis not present

## 2024-01-29 DIAGNOSIS — J984 Other disorders of lung: Secondary | ICD-10-CM

## 2024-01-29 DIAGNOSIS — D649 Anemia, unspecified: Secondary | ICD-10-CM | POA: Diagnosis present

## 2024-01-29 DIAGNOSIS — J441 Chronic obstructive pulmonary disease with (acute) exacerbation: Secondary | ICD-10-CM | POA: Diagnosis present

## 2024-01-29 DIAGNOSIS — Z7984 Long term (current) use of oral hypoglycemic drugs: Secondary | ICD-10-CM

## 2024-01-29 DIAGNOSIS — Z85850 Personal history of malignant neoplasm of thyroid: Secondary | ICD-10-CM

## 2024-01-29 DIAGNOSIS — I272 Pulmonary hypertension, unspecified: Secondary | ICD-10-CM | POA: Diagnosis present

## 2024-01-29 DIAGNOSIS — Z888 Allergy status to other drugs, medicaments and biological substances status: Secondary | ICD-10-CM

## 2024-01-29 DIAGNOSIS — E66813 Obesity, class 3: Secondary | ICD-10-CM | POA: Diagnosis present

## 2024-01-29 DIAGNOSIS — J4520 Mild intermittent asthma, uncomplicated: Secondary | ICD-10-CM

## 2024-01-29 DIAGNOSIS — Z9889 Other specified postprocedural states: Secondary | ICD-10-CM

## 2024-01-29 LAB — BASIC METABOLIC PANEL WITH GFR
Anion gap: 11 (ref 5–15)
Anion gap: 11 (ref 5–15)
BUN: 16 mg/dL (ref 8–23)
BUN: 16 mg/dL (ref 8–23)
CO2: 26 mmol/L (ref 22–32)
CO2: 25 mmol/L (ref 22–32)
Calcium: 8.9 mg/dL (ref 8.9–10.3)
Calcium: 8.4 mg/dL — ABNORMAL LOW (ref 8.9–10.3)
Chloride: 83 mmol/L — ABNORMAL LOW (ref 98–111)
Chloride: 85 mmol/L — ABNORMAL LOW (ref 98–111)
Creatinine, Ser: 0.87 mg/dL (ref 0.44–1.00)
Creatinine, Ser: 0.8 mg/dL (ref 0.44–1.00)
GFR, Estimated: >60 mL/min (ref >60)
GFR, Estimated: >60 mL/min (ref >60)
Glucose, Bld: 159 mg/dL — ABNORMAL HIGH (ref 70–99)
Glucose, Bld: 178 mg/dL — ABNORMAL HIGH (ref 70–99)
Potassium: 3.8 mmol/L (ref 3.5–5.1)
Potassium: 3.4 mmol/L — ABNORMAL LOW (ref 3.5–5.1)
Sodium: 120 mmol/L — ABNORMAL LOW (ref 135–145)
Sodium: 121 mmol/L — ABNORMAL LOW (ref 135–145)

## 2024-01-29 LAB — RESP PANEL BY RT-PCR (RSV, FLU A&B, COVID)  RVPGX2
Influenza A by PCR: NEGATIVE
Influenza B by PCR: NEGATIVE
Resp Syncytial Virus by PCR: NEGATIVE
SARS Coronavirus 2 by RT PCR: NEGATIVE

## 2024-01-29 LAB — CBC
HCT: 35 % — ABNORMAL LOW (ref 36.0–46.0)
Hemoglobin: 12.2 g/dL (ref 12.0–15.0)
MCH: 28.4 pg (ref 26.0–34.0)
MCHC: 34.9 g/dL (ref 30.0–36.0)
MCV: 81.6 fL (ref 80.0–100.0)
Platelets: 291 10*3/uL (ref 150–400)
RBC: 4.29 MIL/uL (ref 3.87–5.11)
RDW: 12.7 % (ref 11.5–15.5)
WBC: 17.1 10*3/uL — ABNORMAL HIGH (ref 4.0–10.5)
nRBC: 0 % (ref 0.0–0.2)

## 2024-01-29 LAB — BRAIN NATRIURETIC PEPTIDE: B Natriuretic Peptide: 80.7 pg/mL (ref 0.0–100.0)

## 2024-01-29 MED ORDER — ALBUTEROL SULFATE (2.5 MG/3ML) 0.083% IN NEBU
2.5000 mg | INHALATION_SOLUTION | Freq: Once | RESPIRATORY_TRACT | Status: AC
  Administered 2024-01-29: 2.5 mg via RESPIRATORY_TRACT
  Filled 2024-01-29: qty 3

## 2024-01-29 MED ORDER — ALBUTEROL SULFATE (2.5 MG/3ML) 0.083% IN NEBU
2.5000 mg | INHALATION_SOLUTION | Freq: Four times a day (QID) | RESPIRATORY_TRACT | Status: DC | PRN
Start: 2024-01-29 — End: 2024-01-30
  Administered 2024-01-29: 2.5 mg via RESPIRATORY_TRACT
  Filled 2024-01-29: qty 3

## 2024-01-29 MED ORDER — METHYLPREDNISOLONE SODIUM SUCC 125 MG IJ SOLR
125.0000 mg | Freq: Once | INTRAMUSCULAR | Status: AC
Start: 2024-01-29 — End: 2024-01-29
  Administered 2024-01-29: 125 mg via INTRAVENOUS
  Filled 2024-01-29: qty 2

## 2024-01-29 MED ORDER — ALBUTEROL SULFATE HFA 108 (90 BASE) MCG/ACT IN AERS: 2.0000 | INHALATION_SPRAY | RESPIRATORY_TRACT | Status: DC | PRN

## 2024-01-29 MED ORDER — ALBUTEROL SULFATE (2.5 MG/3ML) 0.083% IN NEBU
2.5000 mg | INHALATION_SOLUTION | Freq: Four times a day (QID) | RESPIRATORY_TRACT | 12 refills | Status: DC | PRN
  Filled 2024-01-29: qty 75, 7d supply, fill #0

## 2024-01-29 MED ORDER — IOHEXOL 350 MG/ML SOLN
100.0000 mL | Freq: Once | INTRAVENOUS | Status: AC | PRN
  Administered 2024-01-29: 100 mL via INTRAVENOUS

## 2024-01-29 MED ORDER — SODIUM CHLORIDE 0.9 % IV SOLN: INTRAVENOUS | Status: DC

## 2024-01-29 MED ORDER — IPRATROPIUM-ALBUTEROL 0.5-2.5 (3) MG/3ML IN SOLN
3.0000 mL | Freq: Once | RESPIRATORY_TRACT | Status: AC
Start: 2024-01-29 — End: 2024-01-29
  Administered 2024-01-29: 3 mL via RESPIRATORY_TRACT
  Filled 2024-01-29: qty 3

## 2024-01-29 MED ORDER — SODIUM CHLORIDE 0.9 % IV BOLUS
1000.0000 mL | Freq: Once | INTRAVENOUS | Status: AC
  Administered 2024-01-29: 1000 mL via INTRAVENOUS

## 2024-01-29 MED ORDER — IPRATROPIUM-ALBUTEROL 0.5-2.5 (3) MG/3ML IN SOLN
3.0000 mL | Freq: Once | RESPIRATORY_TRACT | Status: AC
  Administered 2024-01-29: 3 mL via RESPIRATORY_TRACT
  Filled 2024-01-29: qty 3

## 2024-01-29 MED ORDER — MAGNESIUM SULFATE 2 GM/50ML IV SOLN
2.0000 g | Freq: Once | INTRAVENOUS | Status: AC
  Administered 2024-01-29: 2 g via INTRAVENOUS
  Filled 2024-01-29: qty 50

## 2024-01-29 NOTE — Telephone Encounter (Signed)
 I just sent a E2C2 to you. I called PT and said we have no appts avail. Although she is a little better, she thinks the ER is missing something because she is so sick. Please call to advise. TY.

## 2024-01-29 NOTE — ED Notes (Signed)
 Pt to CT

## 2024-01-29 NOTE — ED Notes (Signed)
 RT Note: Patient was placed on 2lpm [place] due to a low oxygen saturation 90% RA. When she has her coughing spells her oxygen saturation will decrease to 86% RA. She is currently 94% on 2lpm [place]

## 2024-01-29 NOTE — ED Notes (Signed)
 [phone] family contact

## 2024-01-29 NOTE — Telephone Encounter (Signed)
 Spoke with pt  She is c/o cough and SOB for approx 3 wks  She has been treated with augmentin and is now on pred, got a depo medrol inj at [facility] yesterday 01/28/24 She is not feeling better- coughing up thick, light yellow sputum and has been wheezing and having tightness in her chest She is currently using her neb with albuterol about every 6 hours Denies any fevers, aches  She is asking for recs from provider  We have no openings for several weeks  MR not available today so routing to provider of the day Please advise, thanks!  Allergies  Allergen Reactions   Lisinopril Cough   Sulfa Antibiotics Rash

## 2024-01-29 NOTE — ED Notes (Signed)
 RT Note: Patient is given a 5mg  Albuterol/ 5mg  Atrovent due to respiratory distress and bronchospasm . Patient had a decrease in oxygen saturation 86% on room air that lasted as long as her cough.   SPO2 currently 95% RA.

## 2024-01-29 NOTE — Telephone Encounter (Signed)
 Agree if she is no better on the medications ordered then she needs to be seen in the ED

## 2024-01-29 NOTE — ED Provider Notes (Signed)
  EMERGENCY DEPARTMENT AT [facility] Provider Note   CSN: [id number] Arrival date & time: 01/29/24  1243     History  Chief Complaint  Patient presents with   Shortness of Breath    Katelyn [name] is a 68 y.o. female with history of thyroid  cancer, hypertension, pulmonary hypertension, small airways disease, who presents the emergency department with concern for cough and shortness of breath.  Patient states that she has been dealing with this for about 3 weeks, was put on a course of Augmentin  and finished that.  She went to urgent care yesterday and was given steroid injection.  She states that she is continuing to use nebulizers at home every 6 hours.  She is not feeling any better, coughing up some thick yellow sputum and also having wheezing and tightness in her chest.  She is at when she has a fit of coughing she has significant difficulty catching her breath and feels like her airway is closing.  Denies any fever.  Denies any known sick contacts or exposure to allergens.   Shortness of Breath Associated symptoms: cough and wheezing        Home Medications Prior to Admission medications   Medication Sig Start Date End Date Taking? Authorizing Provider  albuterol  (PROVENTIL ) (2.5 MG/3ML) 0.083% nebulizer solution Take 3 mLs (2.5 mg total) by nebulization every 6 (six) hours as needed for wheezing or shortness of breath. 01/29/24   Raejean Bullock, NP  albuterol  (VENTOLIN  HFA) 108 (90 Base) MCG/ACT inhaler Inhale 1 puff into the lungs every 4 (four) hours as needed. 12/26/23     amoxicillin -clavulanate (AUGMENTIN ) 875-125 MG tablet Take 1 tablet by mouth 2 (two) times daily. 01/21/24   Angelia Kelp, PA-C  aspirin  EC 81 MG tablet Take 1 tablet (81 mg total) by mouth daily. Swallow whole. 07/10/22   Arty Binning, MD  atorvastatin  (LIPITOR) 10 MG tablet Take 1 tablet (10 mg total) by mouth daily. 12/09/23     benzonatate  (TESSALON ) 100 MG capsule Take 1 capsule  (100 mg total) by mouth 3 (three) times daily as needed for cough. Patient not taking: Reported on 11/22/2023 01/17/23   Lanetta Pion, NP  brompheniramine-pseudoephedrine-DM 30-2-10 MG/5ML syrup Take 5 mLs by mouth 4 (four) times daily as needed. 01/17/23   Lanetta Pion, NP  Calcium  Carb-Cholecalciferol (CALCIUM +D3 PO) Take 1 tablet by mouth at bedtime.    [provider]  escitalopram  (LEXAPRO ) 10 MG tablet Take 1 tablet (10 mg total) by mouth daily. 09/18/23     fluticasone  (FLONASE ) 50 MCG/ACT nasal spray Place 2 sprays into both nostrils daily. Patient not taking: Reported on 11/22/2023 01/17/23   Lanetta Pion, NP  fluticasone  (FLONASE ) 50 MCG/ACT nasal spray Place 1 spray into both nostrils daily. Patient not taking: Reported on 11/22/2023 01/17/23     levothyroxine  (SYNTHROID ) 112 MCG tablet Take 1 tablet (112 mcg total) by mouth in the morning on an empty stomach. Patient not taking: Reported on 11/22/2023 12/07/22     levothyroxine  (SYNTHROID ) 112 MCG tablet Take 1 tablet (112 mcg total) by mouth daily. Patient not taking: Reported on 11/22/2023 09/16/23     levothyroxine  (SYNTHROID ) 112 MCG tablet Take 1 tablet (112 mcg total) by mouth every morning on an empty stomach. 11/22/23     losartan -hydrochlorothiazide  (HYZAAR ) 100-12.5 MG tablet Take 1 tablet by mouth daily. Patient not taking: Reported on 11/22/2023 09/16/23     losartan -hydrochlorothiazide  (HYZAAR ) 100-25 MG tablet Take 1  tablet by mouth daily. 09/18/23     metFORMIN  (GLUCOPHAGE -XR) 500 MG 24 hr tablet Take 4 tablets (2,000 mg total) by mouth daily. 05/21/23     montelukast  (SINGULAIR ) 10 MG tablet Take 1 tablet (10 mg total) by mouth at bedtime. 12/13/23   Maire Scot, MD  omeprazole  (PRILOSEC) 20 MG capsule Take 1 capsule (20 mg total) by mouth daily. 12/13/23   Antonio Baumgarten, NP  predniSONE  (DELTASONE ) 20 MG tablet Take 3 tablets (60 mg total) by mouth daily for 5 days. 01/28/24 02/03/24  Leonides Ramp, FNP      Allergies     Lisinopril and Sulfa antibiotics    Review of Systems   Review of Systems  Respiratory:  Positive for cough, shortness of breath and wheezing.   All other systems reviewed and are negative.   Physical Exam Updated Vital Signs BP (!) 120/56 (BP Location: Right Arm)   Pulse 99   Temp 98 F (36.7 C)   Resp 19   LMP  (LMP Unknown)   SpO2 93%  Physical Exam Vitals and nursing note reviewed.  Constitutional:      Appearance: Normal appearance.  HENT:     Head: Normocephalic and atraumatic.  Eyes:     Conjunctiva/sclera: Conjunctivae normal.  Cardiovascular:     Rate and Rhythm: Normal rate and regular rhythm.  Pulmonary:     Effort: Pulmonary effort is normal. No respiratory distress.     Breath sounds: Decreased air movement present.     Comments: Lung sounds diminished in all fields Abdominal:     General: There is no distension.     Palpations: Abdomen is soft.     Tenderness: There is no abdominal tenderness.  Skin:    General: Skin is warm and dry.  Neurological:     General: No focal deficit present.     Mental Status: She is alert.     ED Results / Procedures / Treatments   Labs (all labs ordered are listed, but only abnormal results are displayed) Labs Reviewed  CBC - Abnormal; Notable for the following components:      Result Value   WBC 17.1 (*)    HCT 35.0 (*)    All other components within normal limits  BASIC METABOLIC PANEL WITH GFR - Abnormal; Notable for the following components:   Sodium 120 (*)    Chloride 83 (*)    Glucose, Bld 159 (*)    All other components within normal limits  BASIC METABOLIC PANEL WITH GFR - Abnormal; Notable for the following components:   Sodium 121 (*)    Potassium 3.4 (*)    Chloride 85 (*)    Glucose, Bld 178 (*)    Calcium  8.4 (*)    All other components within normal limits  RESP PANEL BY RT-PCR (RSV, FLU A&B, COVID)  [id number]  BRAIN NATRIURETIC PEPTIDE    EKG EKG Interpretation Date/Time:  Wednesday  January 29 2024 13:08:30 EDT Ventricular Rate:  104 PR Interval:  149 QRS Duration:  88 QT Interval:  386 QTC Calculation: 508 R Axis:   -20  Text Interpretation: Sinus tachycardia Ventricular premature complex Borderline left axis deviation Prolonged QT interval Confirmed by Onetha Bile [phone]) on 01/29/2024 3:04:12 PM  Radiology CT Angio Chest Pulmonary Embolism (PE) W or WO Contrast Result Date: 01/29/2024 CLINICAL DATA:  Pulmonary embolism (PE) suspected, high prob. Cough. Shortness of breath. EXAM: CT ANGIOGRAPHY CHEST WITH CONTRAST TECHNIQUE: Multidetector CT imaging of the chest was  performed using the standard protocol during bolus administration of intravenous contrast. Multiplanar CT image reconstructions and MIPs were obtained to evaluate the vascular anatomy. RADIATION DOSE REDUCTION: This exam was performed according to the departmental dose-optimization program which includes automated exposure control, adjustment of the mA and/or kV according to patient size and/or use of iterative reconstruction technique. CONTRAST:  OMNIPAQUE  IOHEXOL  350 MG/ML SOLN COMPARISON:  06/20/2023 FINDINGS: Cardiovascular: No filling defects in the pulmonary arteries to suggest pulmonary emboli. Heart is normal size. Aorta is normal caliber. Scattered aortic calcifications. Mediastinum/Nodes: No mediastinal, hilar, or axillary adenopathy. Trachea and esophagus are unremarkable. Thyroid  unremarkable. Lungs/Pleura: No confluent opacities or effusions. Upper Abdomen: No acute findings Musculoskeletal: Chest wall soft tissues are unremarkable. No acute bony abnormality. Review of the MIP images confirms the above findings. IMPRESSION: No evidence of pulmonary embolus. No acute cardiopulmonary disease. Aortic Atherosclerosis (ICD10-I70.0). Electronically Signed   By: Janeece Mechanic M.D.   On: 01/29/2024 17:27   DG Chest Port 1 View Result Date: 01/29/2024 CLINICAL DATA:  Shortness of breath, cough EXAM:  PORTABLE CHEST 1 VIEW COMPARISON:  01/28/2024 FINDINGS: Low lung volumes. Vascular congestion and bibasilar atelectasis. Heart mediastinal contours within normal limits. No acute bony abnormality. IMPRESSION: Low lung volumes with vascular congestion and bibasilar atelectasis. Electronically Signed   By: Janeece Mechanic M.D.   On: 01/29/2024 17:26   DG Chest 2 View Result Date: 01/28/2024 CLINICAL DATA:  Shortness of breath EXAM: CHEST - 2 VIEW COMPARISON:  10/02/2021 FINDINGS: Mild interstitial prominence.  No focal airspace disease. Heart size and mediastinal contours are within normal limits. Aortic Atherosclerosis (ICD10-170.0). No effusion. Visualized bones unremarkable. IMPRESSION: Mild interstitial prominence. No acute findings. Electronically Signed   By: Nicoletta Barrier M.D.   On: 01/28/2024 15:29    Procedures .Critical Care  Performed by: Leva Rayas, PA-C Authorized by: Cylee Dattilo T, PA-C   Critical care provider statement:    Critical care time (minutes):  30   Critical care was necessary to treat or prevent imminent or life-threatening deterioration of the following conditions:  Respiratory failure   Critical care was time spent personally by me on the following activities:  Development of treatment plan with patient or surrogate, discussions with consultants, evaluation of patient's response to treatment, examination of patient, ordering and review of laboratory studies, ordering and review of radiographic studies, ordering and performing treatments and interventions, pulse oximetry, re-evaluation of patient's condition and review of old charts   I assumed direction of critical care for this patient from another provider in my specialty: no     Care discussed with: admitting provider       Medications Ordered in ED Medications  albuterol  (VENTOLIN  HFA) 108 (90 Base) MCG/ACT inhaler 2 puff (has no administration in time range)  albuterol  (PROVENTIL ) (2.5 MG/3ML) 0.083%  nebulizer solution 2.5 mg (2.5 mg Nebulization Given 01/29/24 1300)  ipratropium-albuterol  (DUONEB) 0.5-2.5 (3) MG/3ML nebulizer solution 3 mL (3 mLs Nebulization Given 01/29/24 1300)  ipratropium-albuterol  (DUONEB) 0.5-2.5 (3) MG/3ML nebulizer solution 3 mL (3 mLs Nebulization Given 01/29/24 1408)  methylPREDNISolone  sodium succinate (SOLU-MEDROL ) 125 mg/2 mL injection 125 mg (125 mg Intravenous Given 01/29/24 1413)  magnesium  sulfate IVPB 2 g 50 mL (0 g Intravenous Stopped 01/29/24 1523)  sodium chloride  0.9 % bolus 1,000 mL (0 mLs Intravenous Stopped 01/29/24 1600)  iohexol  (OMNIPAQUE ) 350 MG/ML injection 100 mL (100 mLs Intravenous Contrast Given 01/29/24 1616)    ED Course/ Medical Decision Making/ A&P Clinical Course as of  01/29/24 1849  Wed Jan 29, 2024  1503 Stable  Small Airways Disease follows with pulmonology SOB. Steroids OP not controlling symptoms.   [CC]    Clinical Course User Index [CC] Onetha Bile, MD                                 Medical Decision Making Amount and/or Complexity of Data Reviewed Labs: ordered. Radiology: ordered.  Risk Prescription drug management.  This patient is a 68 y.o. female  who presents to the ED for concern of shortness of breath.   Differential diagnoses prior to evaluation: The emergent differential diagnosis includes, but is not limited to,  CHF, pericardial effusion/tamponade, arrhythmias, ACS, COPD, asthma, bronchitis, pneumonia, pneumothorax, PE, anemia. This is not an exhaustive differential.   Past Medical History / Co-morbidities / Social History: thyroid  cancer, hypertension, pulmonary hypertension, small airways disease  Additional history: Chart reviewed. Pertinent results include: Reviewed urgent care visit note from yesterday, and most recent pulmonology visit in February as well as latest high-resolution chest CT in September 2024.  Physical Exam: Physical exam performed. The pertinent findings include: Normal  vital signs, no acute distress.  Diminished lung sounds in all fields.  After my exam patient experienced a fit of coughing and oxygen saturation dropped to 86%, she was put on 2 L nasal cannula.  Lab Tests/Imaging studies: I personally interpreted labs/imaging and the pertinent results include: WBC 17.1, normal hemoglobin.  Sodium 120, chloride 83, otherwise BMP unremarkable.  Respiratory panel negative.  BNP normal.  CT with vascular congestion, CT PE study with no evidence of PE and no acute cardiopulmonary disease.  I agree with the radiologist interpretation.  Cardiac monitoring: EKG obtained and interpreted by myself and attending physician which shows: Sinus tachycardia with prolonged QT interval   Medications: I ordered medication including DuoNeb, Solu-Medrol , magnesium , IVF.  I have reviewed the patients home medicines and have made adjustments as needed.  Consultations obtained: I consulted with [facility] hospitalist Dr Poplawski who recommended: medical admission   Disposition: After consideration of the diagnostic results and the patients response to treatment, I feel that patient would be affected from hospitalization for further treatment and observation in setting of shortness of breath with hypoxia, likely caused by flare of small airways disease.   I discussed this case with my attending physician Dr. Urban Garden who cosigned this note including patient's presenting symptoms, physical exam, and planned diagnostics and interventions. Attending physician stated agreement with plan or made changes to plan which were implemented.   Final Clinical Impression(s) / ED Diagnoses Final diagnoses:  Shortness of breath  Small airways disease  Acute hypoxic respiratory failure (HCC)    Rx / DC Orders ED Discharge Orders     None      Portions of this report may have been transcribed using voice recognition software. Every effort was made to ensure accuracy; however, inadvertent  computerized transcription errors may be present.    Xitlalli Newhard T, PA-C 01/29/24 1850    Deatra Face, MD 01/31/24 2332    Deatra Face, MD 01/31/24 [phone]

## 2024-01-29 NOTE — Telephone Encounter (Signed)
 Called and spoke with Katelyn [name]. Pt and son was on the way to ED. NFN

## 2024-01-29 NOTE — ED Triage Notes (Addendum)
 She c/o frequent significant cough, with occasional prolonged episodes of coughing, after which she has "trouble catching my breath. Seen at urgent care yesterday; shortness of breath without fever worse today. Yesterday she received 125 mg of Solu Medrol and today she took 60 mg of Prednisone p.o. Our R.T. met her at triage and states pt. Is not bronchospastic. She is alert and moderately short of breath.

## 2024-01-30 ENCOUNTER — Encounter (HOSPITAL_COMMUNITY): Payer: Self-pay | Admitting: Internal Medicine

## 2024-01-30 DIAGNOSIS — Z6841 Body Mass Index (BMI) 40.0 and over, adult: Secondary | ICD-10-CM | POA: Diagnosis not present

## 2024-01-30 DIAGNOSIS — Z7984 Long term (current) use of oral hypoglycemic drugs: Secondary | ICD-10-CM | POA: Diagnosis not present

## 2024-01-30 DIAGNOSIS — K219 Gastro-esophageal reflux disease without esophagitis: Secondary | ICD-10-CM | POA: Diagnosis present

## 2024-01-30 DIAGNOSIS — Z882 Allergy status to sulfonamides status: Secondary | ICD-10-CM | POA: Diagnosis not present

## 2024-01-30 DIAGNOSIS — J9601 Acute respiratory failure with hypoxia: Secondary | ICD-10-CM | POA: Diagnosis not present

## 2024-01-30 DIAGNOSIS — E860 Dehydration: Secondary | ICD-10-CM | POA: Diagnosis not present

## 2024-01-30 DIAGNOSIS — E890 Postprocedural hypothyroidism: Secondary | ICD-10-CM | POA: Diagnosis not present

## 2024-01-30 DIAGNOSIS — R0602 Shortness of breath: Secondary | ICD-10-CM | POA: Diagnosis present

## 2024-01-30 DIAGNOSIS — I10 Essential (primary) hypertension: Secondary | ICD-10-CM | POA: Diagnosis not present

## 2024-01-30 DIAGNOSIS — E871 Hypo-osmolality and hyponatremia: Secondary | ICD-10-CM | POA: Diagnosis present

## 2024-01-30 DIAGNOSIS — J9811 Atelectasis: Secondary | ICD-10-CM | POA: Diagnosis present

## 2024-01-30 DIAGNOSIS — Z7982 Long term (current) use of aspirin: Secondary | ICD-10-CM | POA: Diagnosis not present

## 2024-01-30 DIAGNOSIS — Z77098 Contact with and (suspected) exposure to other hazardous, chiefly nonmedicinal, chemicals: Secondary | ICD-10-CM | POA: Diagnosis present

## 2024-01-30 DIAGNOSIS — Z79890 Hormone replacement therapy (postmenopausal): Secondary | ICD-10-CM | POA: Diagnosis not present

## 2024-01-30 DIAGNOSIS — E66813 Obesity, class 3: Secondary | ICD-10-CM | POA: Diagnosis present

## 2024-01-30 DIAGNOSIS — J441 Chronic obstructive pulmonary disease with (acute) exacerbation: Secondary | ICD-10-CM | POA: Diagnosis not present

## 2024-01-30 DIAGNOSIS — Z1152 Encounter for screening for COVID-19: Secondary | ICD-10-CM | POA: Diagnosis not present

## 2024-01-30 DIAGNOSIS — R7303 Prediabetes: Secondary | ICD-10-CM | POA: Diagnosis present

## 2024-01-30 DIAGNOSIS — D649 Anemia, unspecified: Secondary | ICD-10-CM | POA: Diagnosis not present

## 2024-01-30 DIAGNOSIS — Z79899 Other long term (current) drug therapy: Secondary | ICD-10-CM | POA: Diagnosis not present

## 2024-01-30 DIAGNOSIS — J45909 Unspecified asthma, uncomplicated: Secondary | ICD-10-CM | POA: Diagnosis present

## 2024-01-30 DIAGNOSIS — E876 Hypokalemia: Secondary | ICD-10-CM | POA: Diagnosis present

## 2024-01-30 DIAGNOSIS — Z90710 Acquired absence of both cervix and uterus: Secondary | ICD-10-CM | POA: Diagnosis not present

## 2024-01-30 DIAGNOSIS — J45901 Unspecified asthma with (acute) exacerbation: Secondary | ICD-10-CM | POA: Diagnosis present

## 2024-01-30 DIAGNOSIS — Z85850 Personal history of malignant neoplasm of thyroid: Secondary | ICD-10-CM | POA: Diagnosis not present

## 2024-01-30 DIAGNOSIS — I272 Pulmonary hypertension, unspecified: Secondary | ICD-10-CM | POA: Diagnosis not present

## 2024-01-30 LAB — COMPREHENSIVE METABOLIC PANEL WITH GFR
ALT: 27 U/L (ref 0–44)
AST: 33 U/L (ref 15–41)
Albumin: 3.5 g/dL (ref 3.5–5.0)
Alkaline Phosphatase: 65 U/L (ref 38–126)
Anion gap: 13 (ref 5–15)
BUN: 18 mg/dL (ref 8–23)
CO2: 25 mmol/L (ref 22–32)
Calcium: 8.2 mg/dL — ABNORMAL LOW (ref 8.9–10.3)
Chloride: 86 mmol/L — ABNORMAL LOW (ref 98–111)
Creatinine, Ser: 0.86 mg/dL (ref 0.44–1.00)
GFR, Estimated: >60 mL/min (ref >60)
Glucose, Bld: 117 mg/dL — ABNORMAL HIGH (ref 70–99)
Potassium: 3.5 mmol/L (ref 3.5–5.1)
Sodium: 124 mmol/L — ABNORMAL LOW (ref 135–145)
Total Bilirubin: 0.4 mg/dL (ref 0.0–1.2)
Total Protein: 6.3 g/dL — ABNORMAL LOW (ref 6.5–8.1)

## 2024-01-30 LAB — CBC WITH DIFFERENTIAL/PLATELET
Abs Immature Granulocytes: 0.1 10*3/uL — ABNORMAL HIGH (ref 0.00–0.07)
Basophils Absolute: 0 10*3/uL (ref 0.0–0.1)
Basophils Relative: 0 %
Eosinophils Absolute: 0 10*3/uL (ref 0.0–0.5)
Eosinophils Relative: 0 %
HCT: 32.4 % — ABNORMAL LOW (ref 36.0–46.0)
Hemoglobin: 11.4 g/dL — ABNORMAL LOW (ref 12.0–15.0)
Immature Granulocytes: 1 %
Lymphocytes Relative: 8 %
Lymphs Abs: 1.2 10*3/uL (ref 0.7–4.0)
MCH: 28.6 pg (ref 26.0–34.0)
MCHC: 35.2 g/dL (ref 30.0–36.0)
MCV: 81.2 fL (ref 80.0–100.0)
Monocytes Absolute: 0.8 10*3/uL (ref 0.1–1.0)
Monocytes Relative: 5 %
Neutro Abs: 12.9 10*3/uL — ABNORMAL HIGH (ref 1.7–7.7)
Neutrophils Relative %: 86 %
Platelets: 302 10*3/uL (ref 150–400)
RBC: 3.99 MIL/uL (ref 3.87–5.11)
RDW: 12.5 % (ref 11.5–15.5)
WBC: 15 10*3/uL — ABNORMAL HIGH (ref 4.0–10.5)
nRBC: 0 % (ref 0.0–0.2)

## 2024-01-30 LAB — BASIC METABOLIC PANEL WITH GFR
Anion gap: 10 (ref 5–15)
BUN: 14 mg/dL (ref 8–23)
CO2: 27 mmol/L (ref 22–32)
Calcium: 8.3 mg/dL — ABNORMAL LOW (ref 8.9–10.3)
Chloride: 86 mmol/L — ABNORMAL LOW (ref 98–111)
Creatinine, Ser: 0.74 mg/dL (ref 0.44–1.00)
GFR, Estimated: >60 mL/min (ref >60)
Glucose, Bld: 148 mg/dL — ABNORMAL HIGH (ref 70–99)
Potassium: 3.7 mmol/L (ref 3.5–5.1)
Sodium: 123 mmol/L — ABNORMAL LOW (ref 135–145)

## 2024-01-30 LAB — GLUCOSE, CAPILLARY
Glucose-Capillary: 171 mg/dL — ABNORMAL HIGH (ref 70–99)
Glucose-Capillary: 148 mg/dL — ABNORMAL HIGH (ref 70–99)

## 2024-01-30 LAB — MAGNESIUM: Magnesium: 2.3 mg/dL (ref 1.7–2.4)

## 2024-01-30 LAB — PHOSPHORUS: Phosphorus: 3 mg/dL (ref 2.5–4.6)

## 2024-01-30 MED ORDER — ESCITALOPRAM OXALATE 10 MG PO TABS
10.0000 mg | ORAL_TABLET | Freq: Every day | ORAL | Status: DC
  Administered 2024-01-31: 10 mg via ORAL
  Filled 2024-01-30: qty 1

## 2024-01-30 MED ORDER — IPRATROPIUM-ALBUTEROL 0.5-2.5 (3) MG/3ML IN SOLN
3.0000 mL | RESPIRATORY_TRACT | Status: DC
Start: 2024-01-30 — End: 2024-01-30
  Administered 2024-01-30: 3 mL via RESPIRATORY_TRACT
  Filled 2024-01-30: qty 3

## 2024-01-30 MED ORDER — ALBUTEROL SULFATE (2.5 MG/3ML) 0.083% IN NEBU
2.5000 mg | INHALATION_SOLUTION | RESPIRATORY_TRACT | Status: DC | PRN
  Administered 2024-01-30 – 2024-01-31 (×4): 2.5 mg via RESPIRATORY_TRACT
  Filled 2024-01-30 (×4): qty 3

## 2024-01-30 MED ORDER — ACETAMINOPHEN 325 MG PO TABS: 650.0000 mg | ORAL_TABLET | Freq: Four times a day (QID) | ORAL | Status: DC | PRN

## 2024-01-30 MED ORDER — ACETAMINOPHEN 650 MG RE SUPP: 650.0000 mg | Freq: Four times a day (QID) | RECTAL | Status: DC | PRN

## 2024-01-30 MED ORDER — AZITHROMYCIN 250 MG PO TABS
500.0000 mg | ORAL_TABLET | Freq: Every day | ORAL | Status: DC
  Administered 2024-01-31: 500 mg via ORAL
  Filled 2024-01-30: qty 2

## 2024-01-30 MED ORDER — ASPIRIN 81 MG PO TBEC
81.0000 mg | DELAYED_RELEASE_TABLET | Freq: Every day | ORAL | Status: DC
  Administered 2024-01-31: 81 mg via ORAL
  Filled 2024-01-30: qty 1

## 2024-01-30 MED ORDER — LEVOTHYROXINE SODIUM 25 MCG PO TABS: 112.5000 ug | ORAL_TABLET | Freq: Once | ORAL | Status: DC

## 2024-01-30 MED ORDER — ATORVASTATIN CALCIUM 10 MG PO TABS
10.0000 mg | ORAL_TABLET | Freq: Once | ORAL | Status: AC
  Administered 2024-01-30: 10 mg via ORAL
  Filled 2024-01-30: qty 1

## 2024-01-30 MED ORDER — PREDNISONE 20 MG PO TABS
40.0000 mg | ORAL_TABLET | Freq: Every day | ORAL | Status: DC
Start: 2024-01-30 — End: 2024-01-31
  Administered 2024-01-30 – 2024-01-31 (×2): 40 mg via ORAL
  Filled 2024-01-30 (×2): qty 2

## 2024-01-30 MED ORDER — IPRATROPIUM-ALBUTEROL 0.5-2.5 (3) MG/3ML IN SOLN
3.0000 mL | Freq: Four times a day (QID) | RESPIRATORY_TRACT | Status: DC
  Administered 2024-01-30: 3 mL via RESPIRATORY_TRACT
  Filled 2024-01-30 (×2): qty 3

## 2024-01-30 MED ORDER — LEVOTHYROXINE SODIUM 112 MCG PO TABS
112.0000 ug | ORAL_TABLET | Freq: Every day | ORAL | Status: DC
  Administered 2024-01-31: 112 ug via ORAL
  Filled 2024-01-30: qty 1

## 2024-01-30 MED ORDER — SODIUM CHLORIDE 0.9 % IV SOLN
500.0000 mg | Freq: Once | INTRAVENOUS | Status: AC
  Administered 2024-01-30: 500 mg via INTRAVENOUS
  Filled 2024-01-30: qty 5

## 2024-01-30 MED ORDER — METFORMIN HCL ER 750 MG PO TB24: 2000.0000 mg | ORAL_TABLET | Freq: Every day | ORAL | Status: DC

## 2024-01-30 MED ORDER — ONDANSETRON HCL 4 MG PO TABS: 4.0000 mg | ORAL_TABLET | Freq: Four times a day (QID) | ORAL | Status: DC | PRN

## 2024-01-30 MED ORDER — IPRATROPIUM-ALBUTEROL 0.5-2.5 (3) MG/3ML IN SOLN
3.0000 mL | RESPIRATORY_TRACT | Status: DC
  Administered 2024-01-30 – 2024-01-31 (×4): 3 mL via RESPIRATORY_TRACT
  Filled 2024-01-30 (×4): qty 3

## 2024-01-30 MED ORDER — LEVOTHYROXINE SODIUM 112 MCG PO TABS: 112.0000 ug | ORAL_TABLET | Freq: Once | ORAL | Status: DC

## 2024-01-30 MED ORDER — MONTELUKAST SODIUM 10 MG PO TABS
10.0000 mg | ORAL_TABLET | Freq: Every day | ORAL | Status: DC
  Administered 2024-01-30: 10 mg via ORAL
  Filled 2024-01-30: qty 1

## 2024-01-30 MED ORDER — PANTOPRAZOLE SODIUM 40 MG PO TBEC
40.0000 mg | DELAYED_RELEASE_TABLET | Freq: Every day | ORAL | Status: DC
  Administered 2024-01-30 – 2024-01-31 (×2): 40 mg via ORAL
  Filled 2024-01-30 (×2): qty 1

## 2024-01-30 MED ORDER — ATORVASTATIN CALCIUM 10 MG PO TABS
10.0000 mg | ORAL_TABLET | Freq: Every day | ORAL | Status: DC
  Administered 2024-01-31: 10 mg via ORAL
  Filled 2024-01-30: qty 1

## 2024-01-30 MED ORDER — INSULIN ASPART 100 UNIT/ML IJ SOLN
0.0000 [IU] | Freq: Three times a day (TID) | INTRAMUSCULAR | Status: DC
  Administered 2024-01-30: 4 [IU] via SUBCUTANEOUS
  Filled 2024-01-30: qty 0.2

## 2024-01-30 MED ORDER — ESCITALOPRAM OXALATE 10 MG PO TABS
10.0000 mg | ORAL_TABLET | Freq: Once | ORAL | Status: AC
  Administered 2024-01-30: 10 mg via ORAL
  Filled 2024-01-30: qty 1

## 2024-01-30 MED ORDER — ASPIRIN 81 MG PO TBEC
81.0000 mg | DELAYED_RELEASE_TABLET | Freq: Once | ORAL | Status: AC
  Administered 2024-01-30: 81 mg via ORAL
  Filled 2024-01-30: qty 1

## 2024-01-30 MED ORDER — ONDANSETRON HCL 4 MG/2ML IJ SOLN
4.0000 mg | Freq: Four times a day (QID) | INTRAMUSCULAR | Status: DC | PRN
Start: 2024-01-30 — End: 2024-01-31

## 2024-01-30 NOTE — H&P (Signed)
 History and Physical    Patient: Katelyn [name] ZOX:[id number] DOB: 31-Mar-1956 DOA: 01/29/2024 DOS: the patient was seen and examined on 01/30/2024 PCP: Adrian Prince, MD  Patient coming from: Home  Chief Complaint:  Chief Complaint  Patient presents with   Shortness of Breath   HPI: Katelyn [name] is a 68 y.o. female with medical history significant of thyroid cancer, hypertension, hypothyroidism, prediabetes, last [place] for hysterectomy, seasonal allergies, history of prolonged respiratory mycoplasma infection who has being exposed to chemical fumes and spring allergens over the past 2 weeks who scented to the emergency department with complaints of dyspnea, cough, fatigue and wheezing. No precordial chest pain, palpitations, diaphoresis, PND, orthopnea or pitting edema of the lower extremities.  No abdominal pain, nausea, emesis, diarrhea, constipation, melena or hematochezia.  No flank pain, dysuria, frequency or hematuria.  No polyuria, polydipsia, polyphagia or blurred vision.  The patient stated that her symptoms and duration of them are similar to those when she had mycoplasma pneumonia infection.  Lab work: CBC showed a white count of 17.1, hemoglobin 12.2 g/dL and platelets 811.  Coronavirus, influenza and RSV PCR test was negative.  BNP normal.  BMP showed a sodium 120 and chloride of 83 mmol/L with a glucose of 159 mg/deciliter, the rest of the electrolytes and renal function was normal.  Imaging: Portable 1 view chest radiograph showed low lung volumes with vascular congestion and bibasilar atelectasis.  CTA chest no evidence of PE or acute cardiopulmonary disease.  There was aortic atherosclerosis.  ED course: Initial vital signs were temperature 98.1 F, pulse 97, respiration 15, BP 149/70 mmHg O2 sat 95% on room air.  The patient received bronchodilators and prednisone.   Review of Systems: As mentioned in the history of present illness. All other systems reviewed and are  negative. Past Medical History:  Diagnosis Date   Cancer (HCC)    thyroid cancer   Hypertension    Hypothyroidism    PONV (postoperative nausea and vomiting)    Pre-diabetes    S/P laparoscopic assisted vaginal hysterectomy (LAVH) 02/26/2018   Seasonal allergies    SVD (spontaneous vaginal delivery)    x 2   Past Surgical History:  Procedure Laterality Date   ANTERIOR AND POSTERIOR REPAIR N/A 02/26/2018   Procedure: ANTERIOR (CYSTOCELE) AND POSTERIOR REPAIR (RECTOCELE);  Surgeon: Katelyn Rein, MD;  Location: [facility];  Service: Gynecology;  Laterality: N/A;   COLONOSCOPY     LAPAROSCOPIC VAGINAL HYSTERECTOMY WITH SALPINGECTOMY Bilateral 02/26/2018   Procedure: LAPAROSCOPIC ASSISTED VAGINAL HYSTERECTOMY WITH SALPINGECTOMY;  Surgeon: Katelyn Rein, MD;  Location: [facility];  Service: Gynecology;  Laterality: Bilateral;   TONSILLECTOMY     TOTAL THYROIDECTOMY     in [place]   WISDOM TOOTH EXTRACTION     Social History:  reports that she has never smoked. She has never used smokeless tobacco. She reports that she does not drink alcohol and does not use drugs.  Allergies  Allergen Reactions   Lisinopril Cough   Sulfa Antibiotics Rash    Family History  Problem Relation Age of Onset   Heart block Mother    Heart attack Father    Heart attack Sister     Prior to Admission medications   Medication Sig Start Date End Date Taking? Authorizing Provider  acetaminophen (TYLENOL) 500 MG tablet Take 500 mg by mouth every 6 (six) hours as needed for mild pain (pain score 1-3) or moderate pain (pain score 4-6).   Yes [provider]  albuterol (PROVENTIL) (2.5 MG/3ML) 0.083% nebulizer solution Take 3 mLs (2.5 mg total) by nebulization every 6 (six) hours as needed for wheezing or shortness of breath. 01/29/24  Yes Raejean Bullock, NP  albuterol (VENTOLIN HFA) 108 (90 Base) MCG/ACT inhaler Inhale 1 puff into the lungs every 4 (four) hours as needed. 12/26/23  Yes   aspirin EC 81  MG tablet Take 1 tablet (81 mg total) by mouth daily. Swallow whole. Patient taking differently: Take 81 mg by mouth in the morning. Swallow whole. 07/10/22  Yes Arty Binning, MD  atorvastatin (LIPITOR) 10 MG tablet Take 1 tablet (10 mg total) by mouth daily. Patient taking differently: Take 10 mg by mouth in the morning. 12/09/23  Yes   benzonatate (TESSALON) 100 MG capsule Take 1 capsule (100 mg total) by mouth 3 (three) times daily as needed for cough. 01/17/23  Yes Lanetta Pion, NP  Calcium Carb-Cholecalciferol (CALCIUM+D3 PO) Take 1 tablet by mouth at bedtime.   Yes [provider]  Coenzyme Q10 (COQ10 PO) Take 1 tablet by mouth in the morning.   Yes [provider]  escitalopram (LEXAPRO) 10 MG tablet Take 1 tablet (10 mg total) by mouth daily. Patient taking differently: Take 10 mg by mouth in the morning. 09/18/23  Yes   fluticasone (FLONASE) 50 MCG/ACT nasal spray Place 1 spray into both nostrils daily. Patient taking differently: Place 1 spray into both nostrils as needed for allergies or rhinitis. 01/17/23  Yes   ibuprofen (ADVIL) 200 MG tablet Take 200-400 mg by mouth every 6 (six) hours as needed for mild pain (pain score 1-3) or moderate pain (pain score 4-6).   Yes [provider]  levothyroxine (SYNTHROID) 112 MCG tablet Take 1 tablet (112 mcg total) by mouth every morning on an empty stomach. 11/22/23  Yes   losartan-hydrochlorothiazide (HYZAAR) 100-25 MG tablet Take 1 tablet by mouth daily. Patient taking differently: Take 1 tablet by mouth in the morning. 09/18/23  Yes   metFORMIN (GLUCOPHAGE-XR) 500 MG 24 hr tablet Take 4 tablets (2,000 mg total) by mouth daily. 05/21/23  Yes   montelukast (SINGULAIR) 10 MG tablet Take 1 tablet (10 mg total) by mouth at bedtime. 12/13/23  Yes Katelyn Scot, MD  omeprazole (PRILOSEC) 20 MG capsule Take 1 capsule (20 mg total) by mouth daily. 12/13/23  Yes Katelyn Baumgarten, NP  predniSONE (DELTASONE) 20 MG tablet Take 3  tablets (60 mg total) by mouth daily for 5 days. 01/28/24 02/03/24 Yes Leonides Ramp, FNP    Physical Exam: Vitals:   01/30/24 1300 01/30/24 1440 01/30/24 1444 01/30/24 1532  BP: 138/79 (!) 152/76    Pulse: 92 93    Resp:  18    Temp:  98.4 F (36.9 C)    TempSrc:  Oral    SpO2: 99% 91%  93%  Weight:   111.1 kg   Height:   5' 4.4" (1.636 m)    Physical Exam Vitals and nursing note reviewed.  Constitutional:      General: She is awake. She is not in acute distress.    Appearance: She is well-developed. She is ill-appearing.     Interventions: Nasal cannula in place.  HENT:     Head: Normocephalic.     Nose: No rhinorrhea.     Mouth/Throat:     Mouth: Mucous membranes are moist.  Eyes:     General: No scleral icterus.    Pupils: Pupils are equal, round, and reactive to light.  Neck:     Vascular: No JVD.  Cardiovascular:     Rate and Rhythm: Normal rate and regular rhythm.     Heart sounds: S1 normal and S2 normal.  Pulmonary:     Effort: Pulmonary effort is normal.     Breath sounds: No wheezing, rhonchi or rales.  Abdominal:     General: Bowel sounds are normal. There is no distension.     Palpations: Abdomen is soft.     Tenderness: There is no abdominal tenderness. There is no guarding.  Musculoskeletal:     Cervical back: Neck supple.     Right lower leg: No edema.     Left lower leg: No edema.  Skin:    General: Skin is warm and dry.  Neurological:     General: No focal deficit present.     Mental Status: She is alert and oriented to person, place, and time.  Psychiatric:        Mood and Affect: Mood normal.        Behavior: Behavior normal. Behavior is cooperative.     Data Reviewed:  Results are pending, will review when available. 05/18/2021 echocardiogram complete. IMPRESSIONS:   1. Left ventricular ejection fraction, by estimation, is 60 to 65%. The  left ventricle has normal function. The left ventricle has no regional  wall motion  abnormalities. Left ventricular diastolic parameters were  normal.   2. Right ventricular systolic function is normal. The right ventricular  size is normal. Tricuspid regurgitation signal is inadequate for assessing  PA pressure.   3. The mitral valve is grossly normal. Trivial mitral valve  regurgitation. No evidence of mitral stenosis.   4. The aortic valve is grossly normal. Aortic valve regurgitation is not  visualized. No aortic stenosis is present.   5. The inferior vena cava is normal in size with greater than 50%  respiratory variability, suggesting right atrial pressure of 3 mmHg.   EKG: Vent. rate 91 BPM PR interval 151 ms QRS duration 86 ms QT/QTcB 368/453 ms P-R-T axes 74 -11 62 Sinus rhythm Abnormal R-wave progression, early transition  Assessment and Plan: Principal Problem:   Acute hypoxemic respiratory failure (HCC) In the setting of:   Reactive airway disease with wheezing Observation/telemetry Continue supplemental oxygen. Continue prednisone 40 mg p.o. daily in a.m. Scheduled and as needed bronchodilators. Follow-up CBC and chemistry in the morning.   Active Problems:   Hyponatremia On diuretic/was drinking extra water (6 cups a day). However, given her symptoms will rule out Legionella.    Pulmonary hypertension (HCC) Following with Dr. Bertrum Brodie.    Laryngopharyngeal reflux (LPR) On pantoprazole 40 mg p.o. daily.    Normocytic anemia Monitor hematocrit and hemoglobin.    Hypokalemia Replace. Magnesium and phosphorus are optimal.    Class 3 obesity Current BMI 41.53 kg/m. Following with PCP. Continue lifestyle modifications.     Advance Care Planning:   Code Status: Full Code   Consults:   Family Communication:   Severity of Illness: The appropriate patient status for this patient is INPATIENT. Inpatient status is judged to be reasonable and necessary in order to provide the required intensity of service to ensure the patient's  safety. The patient's presenting symptoms, physical exam findings, and initial radiographic and laboratory data in the context of their chronic comorbidities is felt to place them at high risk for further clinical deterioration. Furthermore, it is not anticipated that the patient will be medically stable for discharge from the hospital within 2  midnights of admission.   * I certify that at the point of admission it is my clinical judgment that the patient will require inpatient hospital care spanning beyond 2 midnights from the point of admission due to high intensity of service, high risk for further deterioration and high frequency of surveillance required.*  Author: Danice Dural, MD 01/30/2024 6:33 PM  For on call review [email or web address].   This document was prepared using Dragon voice recognition software and may contain some unintended transcription errors.

## 2024-01-30 NOTE — Plan of Care (Signed)
  Problem: Health Behavior/Discharge Planning: Goal: Ability to manage health-related needs will improve Outcome: Progressing   Problem: Clinical Measurements: Goal: Ability to maintain clinical measurements within normal limits will improve Outcome: Progressing Goal: Will remain free from infection Outcome: Progressing Goal: Diagnostic test results will improve Outcome: Progressing   Problem: Fluid Volume: Goal: Ability to maintain a balanced intake and output will improve Outcome: Progressing

## 2024-01-30 NOTE — ED Notes (Signed)
 Called [facility] for transport

## 2024-01-30 NOTE — ED Provider Notes (Signed)
  Physical Exam  BP 133/65   Pulse 85   Temp 97.9 F (36.6 C) (Oral)   Resp (!) 21   LMP  (LMP Unknown)   SpO2 92%   Physical Exam  Procedures  Procedures  ED Course / MDM   Clinical Course as of 01/30/24 0804  Wed Jan 29, 2024  1503 Stable  Small Airways Disease follows with pulmonology SOB. Steroids OP not controlling symptoms.   [CC]    Clinical Course User Index [CC] Onetha Bile, MD   Medical Decision Making Amount and/or Complexity of Data Reviewed Labs: ordered. Radiology: ordered.  Risk Prescription drug management.   68 year old female currently boarding in our freestanding ED.  COPD exacerbation, on supplemental O2.  We are unable to board patients in our freestanding ED for longer than 24 hours.  I begun transfer process to [facility] emergency room.  Dr. Leighton Punches is accepting physician.      Afton Horse T, DO 01/30/24 [phone]

## 2024-01-31 ENCOUNTER — Other Ambulatory Visit (HOSPITAL_COMMUNITY): Payer: Self-pay

## 2024-01-31 DIAGNOSIS — J9601 Acute respiratory failure with hypoxia: Secondary | ICD-10-CM | POA: Diagnosis not present

## 2024-01-31 LAB — GLUCOSE, CAPILLARY
Glucose-Capillary: 112 mg/dL — ABNORMAL HIGH (ref 70–99)
Glucose-Capillary: 104 mg/dL — ABNORMAL HIGH (ref 70–99)

## 2024-01-31 LAB — COMPREHENSIVE METABOLIC PANEL WITH GFR
ALT: 25 U/L (ref 0–44)
AST: 29 U/L (ref 15–41)
Albumin: 3.6 g/dL (ref 3.5–5.0)
Alkaline Phosphatase: 64 U/L (ref 38–126)
Anion gap: 9 (ref 5–15)
BUN: 16 mg/dL (ref 8–23)
CO2: 29 mmol/L (ref 22–32)
Calcium: 8.2 mg/dL — ABNORMAL LOW (ref 8.9–10.3)
Chloride: 92 mmol/L — ABNORMAL LOW (ref 98–111)
Creatinine, Ser: 0.63 mg/dL (ref 0.44–1.00)
GFR, Estimated: >60 mL/min (ref >60)
Glucose, Bld: 133 mg/dL — ABNORMAL HIGH (ref 70–99)
Potassium: 4.1 mmol/L (ref 3.5–5.1)
Sodium: 130 mmol/L — ABNORMAL LOW (ref 135–145)
Total Bilirubin: 0.8 mg/dL (ref 0.0–1.2)
Total Protein: 6.4 g/dL — ABNORMAL LOW (ref 6.5–8.1)

## 2024-01-31 LAB — CBC
HCT: 36.2 % (ref 36.0–46.0)
Hemoglobin: 11.8 g/dL — ABNORMAL LOW (ref 12.0–15.0)
MCH: 28.4 pg (ref 26.0–34.0)
MCHC: 32.6 g/dL (ref 30.0–36.0)
MCV: 87.2 fL (ref 80.0–100.0)
Platelets: 297 10*3/uL (ref 150–400)
RBC: 4.15 MIL/uL (ref 3.87–5.11)
RDW: 13.1 % (ref 11.5–15.5)
WBC: 12.5 10*3/uL — ABNORMAL HIGH (ref 4.0–10.5)
nRBC: 0 % (ref 0.0–0.2)

## 2024-01-31 LAB — HIV ANTIBODY (ROUTINE TESTING W REFLEX): HIV Screen 4th Generation wRfx: NONREACTIVE

## 2024-01-31 LAB — MYCOPLASMA PNEUMONIAE ANTIBODY, IGM: Mycoplasma pneumo IgM: <770 U/mL (ref 0–769)

## 2024-01-31 LAB — PROCALCITONIN: Procalcitonin: <0.1 ng/mL

## 2024-01-31 MED ORDER — ENOXAPARIN SODIUM 40 MG/0.4ML IJ SOSY
40.0000 mg | PREFILLED_SYRINGE | INTRAMUSCULAR | Status: DC
  Filled 2024-01-31: qty 0.4

## 2024-01-31 MED ORDER — ALBUTEROL SULFATE (2.5 MG/3ML) 0.083% IN NEBU
2.5000 mg | INHALATION_SOLUTION | Freq: Four times a day (QID) | RESPIRATORY_TRACT | 0 refills | Status: AC | PRN
  Filled 2024-01-31: qty 75, 7d supply, fill #0

## 2024-01-31 MED ORDER — METOPROLOL TARTRATE 5 MG/5ML IV SOLN: 5.0000 mg | INTRAVENOUS | Status: DC | PRN

## 2024-01-31 MED ORDER — LOSARTAN POTASSIUM 100 MG PO TABS
100.0000 mg | ORAL_TABLET | Freq: Every day | ORAL | 0 refills | Status: DC
  Filled 2024-01-31: qty 30, 30d supply, fill #0

## 2024-01-31 MED ORDER — PREDNISONE 20 MG PO TABS
40.0000 mg | ORAL_TABLET | Freq: Every day | ORAL | 0 refills | Status: DC
  Filled 2024-01-31 (×2): qty 6, 3d supply, fill #0

## 2024-01-31 MED ORDER — AZITHROMYCIN 250 MG PO TABS
250.0000 mg | ORAL_TABLET | Freq: Every day | ORAL | 0 refills | Status: DC
Start: 2024-01-31 — End: 2024-01-31
  Filled 2024-01-31: qty 3, 3d supply, fill #0

## 2024-01-31 MED ORDER — SENNOSIDES-DOCUSATE SODIUM 8.6-50 MG PO TABS: 1.0000 | ORAL_TABLET | Freq: Every evening | ORAL | Status: DC | PRN

## 2024-01-31 MED ORDER — FUROSEMIDE 20 MG PO TABS
20.0000 mg | ORAL_TABLET | Freq: Every day | ORAL | 0 refills | Status: AC | PRN
  Filled 2024-01-31: qty 30, 30d supply, fill #0

## 2024-01-31 MED ORDER — TRAZODONE HCL 50 MG PO TABS: 50.0000 mg | ORAL_TABLET | Freq: Every evening | ORAL | Status: DC | PRN

## 2024-01-31 MED ORDER — GUAIFENESIN 100 MG/5ML PO LIQD: 5.0000 mL | ORAL | Status: DC | PRN

## 2024-01-31 MED ORDER — FUROSEMIDE 20 MG PO TABS
20.0000 mg | ORAL_TABLET | Freq: Every day | ORAL | 0 refills | Status: DC | PRN
  Filled 2024-01-31: qty 30, 30d supply, fill #0

## 2024-01-31 MED ORDER — HYDRALAZINE HCL 20 MG/ML IJ SOLN: 10.0000 mg | INTRAMUSCULAR | Status: DC | PRN

## 2024-01-31 MED ORDER — ALBUTEROL SULFATE HFA 108 (90 BASE) MCG/ACT IN AERS
1.0000 | INHALATION_SPRAY | RESPIRATORY_TRACT | 0 refills | Status: AC | PRN
  Filled 2024-01-31: qty 8, 21d supply, fill #0

## 2024-01-31 MED ORDER — PREDNISONE 20 MG PO TABS
40.0000 mg | ORAL_TABLET | Freq: Every day | ORAL | 0 refills | Status: AC
  Filled 2024-01-31: qty 6, 3d supply, fill #0

## 2024-01-31 MED ORDER — AZITHROMYCIN 250 MG PO TABS
250.0000 mg | ORAL_TABLET | Freq: Every day | ORAL | 0 refills | Status: AC
  Filled 2024-01-31: qty 3, 3d supply, fill #0

## 2024-01-31 NOTE — TOC Initial Note (Signed)
 Transition of Care [facility]) - Initial/Assessment Note    Patient Details  Name: Katelyn [name] MRN: [id number] Date of Birth: 17-Mar-1956  Transition of Care [facility]) CM/SW Contact:    Amaryllis Junior, LCSW Phone Number: 01/31/2024, 10:51 AM  Clinical Narrative:                 Pt from home alone. Pt identified children as support system. Pt continues medical workup. TOC continues to follow for d/c needs.     Barriers to Discharge: Continued Medical Work up   Patient Goals and CMS Choice Patient states their goals for this hospitalization and ongoing recovery are:: return home CMS Medicare.gov Compare Post Acute Care list provided to::  (NA) Choice offered to / list presented to : NA [place] ownership interest in [facility].provided to::  (NA)    Expected Discharge Plan and Services In-house Referral: NA     Living arrangements for the past 2 months: Single Family Home                 DME Arranged: N/A DME Agency: NA       HH Arranged: NA HH Agency: NA        Prior Living Arrangements/Services Living arrangements for the past 2 months: Single Family Home Lives with:: Self Patient language and need for interpreter reviewed:: Yes Do you feel safe going back to the place where you live?: Yes      Need for Family Participation in Patient Care: Yes (Comment) Care giver support system in place?: Yes (comment)   Criminal Activity/Legal Involvement Pertinent to Current Situation/Hospitalization: No - Comment as needed  Activities of Daily Living   ADL Screening (condition at time of admission) Independently performs ADLs?: Yes (appropriate for developmental age) Is the patient deaf or have difficulty hearing?: No Does the patient have difficulty seeing, even when wearing glasses/contacts?: No Does the patient have difficulty concentrating, remembering, or making decisions?: No  Permission Sought/Granted                  Emotional  Assessment Appearance:: Appears stated age Attitude/Demeanor/Rapport: Engaged Affect (typically observed): Accepting Orientation: : Oriented to Self, Oriented to Place, Oriented to  Time, Oriented to Situation Alcohol / Substance Use: Not Applicable Psych Involvement: No (comment)  Admission diagnosis:  Shortness of breath [R06.02] Small airways disease [J98.4] Acute hypoxemic respiratory failure (HCC) [J96.01] Acute hypoxic respiratory failure (HCC) [J96.01] Patient Active Problem List   Diagnosis Date Noted   Hyponatremia 01/30/2024   Normocytic anemia 01/30/2024   Hypokalemia 01/30/2024   Class 3 obesity 01/30/2024   Reactive airway disease with wheezing 01/30/2024   Acute hypoxemic respiratory failure (HCC) 01/29/2024   Chronic cough 05/08/2021   Pulmonary hypertension (HCC) 05/08/2021    Class: Question of   Left lower lobe pulmonary nodule 05/08/2021   Laryngopharyngeal reflux (LPR) 04/04/2021   S/P laparoscopic assisted vaginal hysterectomy (LAVH) 02/26/2018   Cystocele and rectocele with incomplete uterovaginal prolapse 02/26/2018   Goiter, nodular 08/26/2014   PCP:  Rosslyn Coons, MD Pharmacy:   [facility] [address] [place] [address] Phone: [phone] Fax: [phone]     Social Drivers of Health (SDOH) Social History: SDOH Screenings   Food Insecurity: No Food Insecurity (01/30/2024)  Housing: Low Risk  (01/30/2024)  Transportation Needs: No Transportation Needs (01/30/2024)  Utilities: Not At Risk (01/30/2024)  Social Connections: Unknown (01/30/2024)  Tobacco Use: Low Risk  (01/30/2024)   SDOH  Interventions:     Readmission Risk Interventions    01/31/2024   10:50 AM  Readmission Risk Prevention Plan  Post Dischage Appt Complete  Medication Screening Complete  Transportation Screening Complete

## 2024-01-31 NOTE — Hospital Course (Addendum)
 Brief Narrative:  68 year old with history of thyroid  cancer, HTN, hypothyroidism, prediabetes, hysterectomy, seasonal allergies, prolonged respiratory mycoplasma infection exposed to chemical fumes, spring allergens who presented to the ED with complaints of dyspnea, wheezing and fatigue.  Upon admission COVID, influenza, RSV were negative, BNP was normal.  BMP showed hyponatremia with sodium of 120, chest x-ray showed vascular congestion with atelectasis.  CTA negative for PE.  Patient admitted with concerns of reactive airway disease with wheezing on steroids.  The following day after receiving steroids, azithromycin  and bronchodilators she feels significantly well and wishing to go home today.  She is ambulating the hall without any issues.   Assessment & Plan:  Principal Problem:   Acute hypoxemic respiratory failure (HCC) Active Problems:   Pulmonary hypertension (HCC)   Laryngopharyngeal reflux (LPR)   Hyponatremia   Normocytic anemia   Hypokalemia   Class 3 obesity   Reactive airway disease with wheezing   Acute hypoxemic respiratory failure (HCC)   Reactive airway disease with wheezing BNP is normal.  Procalcitonin is negative.  She is feeling significantly well today.  Will transition to p.o. azithromycin , p.o. steroids and prescribe bronchodilators.  She will follow-up outpatient with providers.     Hyponatremia Likely from dehydration.  Now normalized.  Sodium 130.  For now discontinue HCTZ.  Essential hypertension - Discontinue HCTZ.  Continue losartan  100 mg daily.  Advised to continue to monitor outpatient blood pressure and further adjust as necessary.  Lasix  20 mg as needed has been prescribed as well due to occasional lower extremity swelling     Pulmonary hypertension (HCC) Following with Dr. Bertrum Brodie.     Laryngopharyngeal reflux (LPR) On pantoprazole  40 mg p.o. daily.     Normocytic anemia Monitor hematocrit and hemoglobin.     Hypokalemia Replete as  needed     Class 3 obesity Current BMI 41.53 kg/m. Following with PCP. Continue lifestyle modifications.     DVT prophylaxis: Lovenox     Code Status: Full Code Family Communication:   Discharge today  Subjective:  Seen at bedside, feeling significantly well.  Ambulating in the hallway without any shortness of breath.  Examination:  General exam: Appears calm and comfortable  Respiratory system: Clear to auscultation. Respiratory effort normal. Cardiovascular system: S1 & S2 heard, RRR. No JVD, murmurs, rubs, gallops or clicks. No pedal edema. Gastrointestinal system: Abdomen is nondistended, soft and nontender. No organomegaly or masses felt. Normal bowel sounds heard. Central nervous system: Alert and oriented. No focal neurological deficits. Extremities: Symmetric 5 x 5 power. Skin: No rashes, lesions or ulcers Psychiatry: Judgement and insight appear normal. Mood & affect appropriate.

## 2024-01-31 NOTE — Discharge Summary (Signed)
 Physician Discharge Summary  Katelyn [name] FMW:[id number] DOB: 11-Jan-1956 DOA: 01/29/2024  PCP: Nichole Senior, MD  Admit date: 01/29/2024 Discharge date: 01/31/2024  Admitted From: Home Disposition: Home  Recommendations for Outpatient Follow-up:  Follow up with PCP in 1-2 weeks Please obtain BMP/CBC in one week your next doctors visit.  P.o. azithromycin , losartan  prescribed.  Discontinue hydrochlorothiazide  Lasix  20 mg daily as needed P.o. prednisone  for 3 more days Albuterol  inhaler and nebulizer prescribed   Discharge Condition: Stable CODE STATUS: Full code Diet recommendation: Low-salt  Brief/Interim Summary: Brief Narrative:  68 year old with history of thyroid  cancer, HTN, hypothyroidism, prediabetes, hysterectomy, seasonal allergies, prolonged respiratory mycoplasma infection exposed to chemical fumes, spring allergens who presented to the ED with complaints of dyspnea, wheezing and fatigue.  Upon admission COVID, influenza, RSV were negative, BNP was normal.  BMP showed hyponatremia with sodium of 120, chest x-ray showed vascular congestion with atelectasis.  CTA negative for PE.  Patient admitted with concerns of reactive airway disease with wheezing on steroids.  The following day after receiving steroids, azithromycin  and bronchodilators she feels significantly well and wishing to go home today.  She is ambulating the hall without any issues.   Assessment & Plan:  Principal Problem:   Acute hypoxemic respiratory failure (HCC) Active Problems:   Pulmonary hypertension (HCC)   Laryngopharyngeal reflux (LPR)   Hyponatremia   Normocytic anemia   Hypokalemia   Class 3 obesity   Reactive airway disease with wheezing   Acute hypoxemic respiratory failure (HCC)   Reactive airway disease with wheezing BNP is normal.  Procalcitonin is negative.  She is feeling significantly well today.  Will transition to p.o. azithromycin , p.o. steroids and prescribe bronchodilators.  She  will follow-up outpatient with providers.     Hyponatremia Likely from dehydration.  Now normalized.  Sodium 130.  For now discontinue HCTZ.  Essential hypertension - Discontinue HCTZ.  Continue losartan  100 mg daily.  Advised to continue to monitor outpatient blood pressure and further adjust as necessary.  Lasix  20 mg as needed has been prescribed as well due to occasional lower extremity swelling     Pulmonary hypertension (HCC) Following with Dr. Geronimo.     Laryngopharyngeal reflux (LPR) On pantoprazole  40 mg p.o. daily.     Normocytic anemia Monitor hematocrit and hemoglobin.     Hypokalemia Replete as needed     Class 3 obesity Current BMI 41.53 kg/m. Following with PCP. Continue lifestyle modifications.     DVT prophylaxis: Lovenox     Code Status: Full Code Family Communication:   Discharge today  Subjective:  Seen at bedside, feeling significantly well.  Ambulating in the hallway without any shortness of breath.  Examination:  General exam: Appears calm and comfortable  Respiratory system: Clear to auscultation. Respiratory effort normal. Cardiovascular system: S1 & S2 heard, RRR. No JVD, murmurs, rubs, gallops or clicks. No pedal edema. Gastrointestinal system: Abdomen is nondistended, soft and nontender. No organomegaly or masses felt. Normal bowel sounds heard. Central nervous system: Alert and oriented. No focal neurological deficits. Extremities: Symmetric 5 x 5 power. Skin: No rashes, lesions or ulcers Psychiatry: Judgement and insight appear normal. Mood & affect appropriate.    Discharge Diagnoses:  Principal Problem:   Acute hypoxemic respiratory failure (HCC) Active Problems:   Pulmonary hypertension (HCC)   Laryngopharyngeal reflux (LPR)   Hyponatremia   Normocytic anemia   Hypokalemia   Class 3 obesity   Reactive airway disease with wheezing      Discharge Exam: Vitals:  01/31/24 0545 01/31/24 0820  BP:    Pulse:    Resp:     Temp:    SpO2: 99% 96%   Vitals:   01/31/24 0201 01/31/24 0442 01/31/24 0545 01/31/24 0820  BP: (!) 174/93 (!) 166/93    Pulse: 79 75    Resp: 16     Temp: 97.6 F (36.4 C)     TempSrc: Oral     SpO2: 100%  99% 96%  Weight:      Height:          Discharge Instructions  Discharge Instructions     meds to beds pharmacy consult ([facility] ONLY)   Complete by: As directed       Allergies as of 01/31/2024       Reactions   Lisinopril Cough   Sulfa Antibiotics Rash        Medication List     STOP taking these medications    losartan -hydrochlorothiazide  100-25 MG tablet Commonly known as: HYZAAR        TAKE these medications    acetaminophen  500 MG tablet Commonly known as: TYLENOL  Take 500 mg by mouth every 6 (six) hours as needed for mild pain (pain score 1-3) or moderate pain (pain score 4-6).   albuterol  (2.5 MG/3ML) 0.083% nebulizer solution Commonly known as: PROVENTIL  Take 3 mLs (2.5 mg total) by nebulization every 6 (six) hours as needed for wheezing or shortness of breath.   albuterol  108 (90 Base) MCG/ACT inhaler Commonly known as: VENTOLIN  HFA Inhale 1 puff into the lungs every 4 (four) hours as needed.   aspirin  EC 81 MG tablet Take 1 tablet (81 mg total) by mouth daily. Swallow whole. What changed: when to take this   atorvastatin  10 MG tablet Commonly known as: LIPITOR Take 1 tablet (10 mg total) by mouth daily. What changed: when to take this   azithromycin  250 MG tablet Commonly known as: ZITHROMAX  Take 1 tablet (250 mg total) by mouth daily for 3 days.   benzonatate  100 MG capsule Commonly known as: TESSALON  Take 1 capsule (100 mg total) by mouth 3 (three) times daily as needed for cough.   CALCIUM +D3 PO Take 1 tablet by mouth at bedtime.   COQ10 PO Take 1 tablet by mouth in the morning.   escitalopram  10 MG tablet Commonly known as: LEXAPRO  Take 1 tablet (10 mg total) by mouth daily. What changed: when to take this    fluticasone  50 MCG/ACT nasal spray Commonly known as: FLONASE  Place 1 spray into both nostrils daily. What changed:  when to take this reasons to take this   furosemide  20 MG tablet Commonly known as: Lasix  Take 1 tablet (20 mg total) by mouth daily as needed for edema or fluid.   ibuprofen  200 MG tablet Commonly known as: ADVIL  Take 200-400 mg by mouth every 6 (six) hours as needed for mild pain (pain score 1-3) or moderate pain (pain score 4-6).   levothyroxine  112 MCG tablet Commonly known as: SYNTHROID  Take 1 tablet (112 mcg total) by mouth every morning on an empty stomach.   losartan  100 MG tablet Commonly known as: Cozaar  Take 1 tablet (100 mg total) by mouth daily.   metFORMIN  500 MG 24 hr tablet Commonly known as: GLUCOPHAGE -XR Take 4 tablets (2,000 mg total) by mouth daily.   montelukast  10 MG tablet Commonly known as: SINGULAIR  Take 1 tablet (10 mg total) by mouth at bedtime.   omeprazole  20 MG capsule Commonly known as: PRILOSEC Take  1 capsule (20 mg total) by mouth daily.   predniSONE  20 MG tablet Commonly known as: DELTASONE  Take 2 tablets (40 mg total) by mouth daily with breakfast for 3 days. What changed:  how much to take when to take this        Follow-up Information     Nichole Senior, MD Follow up in 1 week(s).   Specialty: Endocrinology Contact information: [address] [place] [address] [phone]         Nichole Senior, MD Follow up in 1 week(s).   Specialty: Endocrinology Contact information: [address] [place] [address] [phone]                Allergies  Allergen Reactions   Lisinopril Cough   Sulfa Antibiotics Rash    You were cared for by a hospitalist during your hospital stay. If you have any questions about your discharge medications or the care you received while you were in the hospital after you are discharged, you can call the unit and asked to speak with the hospitalist on call  if the hospitalist that took care of you is not available. Once you are discharged, your primary care physician will handle any further medical issues. Please note that no refills for any discharge medications will be authorized once you are discharged, as it is imperative that you return to your primary care physician (or establish a relationship with a primary care physician if you do not have one) for your aftercare needs so that they can reassess your need for medications and monitor your lab values.  You were cared for by a hospitalist during your hospital stay. If you have any questions about your discharge medications or the care you received while you were in the hospital after you are discharged, you can call the unit and asked to speak with the hospitalist on call if the hospitalist that took care of you is not available. Once you are discharged, your primary care physician will handle any further medical issues. Please note that NO REFILLS for any discharge medications will be authorized once you are discharged, as it is imperative that you return to your primary care physician (or establish a relationship with a primary care physician if you do not have one) for your aftercare needs so that they can reassess your need for medications and monitor your lab values.  Please request your Prim.MD to go over all Hospital Tests and Procedure/Radiological results at the follow up, please get all Hospital records sent to your Prim MD by signing hospital release before you go home.  Get CBC, CMP, 2 view Chest X ray checked  by Primary MD during your next visit or SNF MD in 5-7 days ( we routinely change or add medications that can affect your baseline labs and fluid status, therefore we recommend that you get the mentioned basic workup next visit with your PCP, your PCP may decide not to get them or add new tests based on their clinical decision)  On your next visit with your primary care physician please  Get Medicines reviewed and adjusted.  If you experience worsening of your admission symptoms, develop shortness of breath, life threatening emergency, suicidal or homicidal thoughts you must seek medical attention immediately by calling 911 or calling your MD immediately  if symptoms less severe.  You Must read complete instructions/literature along with all the possible adverse reactions/side effects for all the Medicines you take and that have been prescribed to you.  Take any new Medicines after you have completely understood and accpet all the possible adverse reactions/side effects.   Do not drive, operate heavy machinery, perform activities at heights, swimming or participation in water activities or provide baby sitting services if your were admitted for syncope or siezures until you have seen by Primary MD or a Neurologist and advised to do so again.  Do not drive when taking Pain medications.   Procedures/Studies: CT Angio Chest Pulmonary Embolism (PE) W or WO Contrast Result Date: 01/29/2024 CLINICAL DATA:  Pulmonary embolism (PE) suspected, high prob. Cough. Shortness of breath. EXAM: CT ANGIOGRAPHY CHEST WITH CONTRAST TECHNIQUE: Multidetector CT imaging of the chest was performed using the standard protocol during bolus administration of intravenous contrast. Multiplanar CT image reconstructions and MIPs were obtained to evaluate the vascular anatomy. RADIATION DOSE REDUCTION: This exam was performed according to the departmental dose-optimization program which includes automated exposure control, adjustment of the mA and/or kV according to patient size and/or use of iterative reconstruction technique. CONTRAST:  OMNIPAQUE  IOHEXOL  350 MG/ML SOLN COMPARISON:  06/20/2023 FINDINGS: Cardiovascular: No filling defects in the pulmonary arteries to suggest pulmonary emboli. Heart is normal size. Aorta is normal caliber. Scattered aortic calcifications. Mediastinum/Nodes: No mediastinal, hilar,  or axillary adenopathy. Trachea and esophagus are unremarkable. Thyroid  unremarkable. Lungs/Pleura: No confluent opacities or effusions. Upper Abdomen: No acute findings Musculoskeletal: Chest wall soft tissues are unremarkable. No acute bony abnormality. Review of the MIP images confirms the above findings. IMPRESSION: No evidence of pulmonary embolus. No acute cardiopulmonary disease. Aortic Atherosclerosis (ICD10-I70.0). Electronically Signed   By: Franky Crease M.D.   On: 01/29/2024 17:27   DG Chest Port 1 View Result Date: 01/29/2024 CLINICAL DATA:  Shortness of breath, cough EXAM: PORTABLE CHEST 1 VIEW COMPARISON:  01/28/2024 FINDINGS: Low lung volumes. Vascular congestion and bibasilar atelectasis. Heart mediastinal contours within normal limits. No acute bony abnormality. IMPRESSION: Low lung volumes with vascular congestion and bibasilar atelectasis. Electronically Signed   By: Franky Crease M.D.   On: 01/29/2024 17:26   DG Chest 2 View Result Date: 01/28/2024 CLINICAL DATA:  Shortness of breath EXAM: CHEST - 2 VIEW COMPARISON:  10/02/2021 FINDINGS: Mild interstitial prominence.  No focal airspace disease. Heart size and mediastinal contours are within normal limits. Aortic Atherosclerosis (ICD10-170.0). No effusion. Visualized bones unremarkable. IMPRESSION: Mild interstitial prominence. No acute findings. Electronically Signed   By: JONETTA Faes M.D.   On: 01/28/2024 15:29   MM 3D SCREENING MAMMOGRAM BILATERAL BREAST Result Date: 01/24/2024 CLINICAL DATA:  Screening. EXAM: DIGITAL SCREENING BILATERAL MAMMOGRAM WITH TOMOSYNTHESIS AND CAD TECHNIQUE: Bilateral screening digital craniocaudal and mediolateral oblique mammograms were obtained. Bilateral screening digital breast tomosynthesis was performed. The images were evaluated with computer-aided detection. COMPARISON:  Previous exam(s). ACR Breast Density Category b: There are scattered areas of fibroglandular density. FINDINGS: There are no findings  suspicious for malignancy. IMPRESSION: No mammographic evidence of malignancy. A result letter of this screening mammogram will be mailed directly to the patient. RECOMMENDATION: Screening mammogram in one year. (Code:[id number]-B-01Y) BI-RADS CATEGORY  1: Negative. Electronically Signed   By: Inocente Ast M.D.   On: 01/24/2024 10:16     The results of significant diagnostics from this hospitalization (including imaging, microbiology, ancillary and laboratory) are listed below for reference.     Microbiology: Recent Results (from the past 240 hours)  Resp panel by RT-PCR (RSV, Flu A&B, Covid) Anterior Nasal Swab     Status: None   Collection Time: 01/29/24  2:15 PM  Specimen: Anterior Nasal Swab  Result Value Ref Range Status   SARS Coronavirus 2 by RT PCR NEGATIVE NEGATIVE Final    Comment: (NOTE) SARS-CoV-2 target nucleic acids are NOT DETECTED.  The SARS-CoV-2 RNA is generally detectable in upper respiratory specimens during the acute phase of infection. The lowest concentration of SARS-CoV-2 viral copies this assay can detect is 138 copies/mL. A negative result does not preclude SARS-Cov-2 infection and should not be used as the sole basis for treatment or other patient management decisions. A negative result may occur with  improper specimen collection/handling, submission of specimen other than nasopharyngeal swab, presence of viral mutation(s) within the areas targeted by this assay, and inadequate number of viral copies(<138 copies/mL). A negative result must be combined with clinical observations, patient history, and epidemiological information. The expected result is Negative.  Fact Sheet for Patients:  bloggercourse.com  Fact Sheet for Healthcare Providers:  seriousbroker.it  This test is no t yet approved or cleared by the [place]  FDA and  has been authorized for detection and/or diagnosis of SARS-CoV-2 by FDA  under an Emergency Use Authorization (EUA). This EUA will remain  in effect (meaning this test can be used) for the duration of the COVID-19 declaration under Section 564(b)(1) of the Act, 21 U.S.C.section 360bbb-3(b)(1), unless the authorization is terminated  or revoked sooner.       Influenza A by PCR NEGATIVE NEGATIVE Final   Influenza B by PCR NEGATIVE NEGATIVE Final    Comment: (NOTE) The Xpert Xpress SARS-CoV-2/FLU/RSV plus assay is intended as an aid in the diagnosis of influenza from Nasopharyngeal swab specimens and should not be used as a sole basis for treatment. Nasal washings and aspirates are unacceptable for Xpert Xpress SARS-CoV-2/FLU/RSV testing.  Fact Sheet for Patients: bloggercourse.com  Fact Sheet for Healthcare Providers: seriousbroker.it  This test is not yet approved or cleared by the [place]  FDA and has been authorized for detection and/or diagnosis of SARS-CoV-2 by FDA under an Emergency Use Authorization (EUA). This EUA will remain in effect (meaning this test can be used) for the duration of the COVID-19 declaration under Section 564(b)(1) of the Act, 21 U.S.C. section 360bbb-3(b)(1), unless the authorization is terminated or revoked.     Resp Syncytial Virus by PCR NEGATIVE NEGATIVE Final    Comment: (NOTE) Fact Sheet for Patients: bloggercourse.com  Fact Sheet for Healthcare Providers: seriousbroker.it  This test is not yet approved or cleared by the [place]  FDA and has been authorized for detection and/or diagnosis of SARS-CoV-2 by FDA under an Emergency Use Authorization (EUA). This EUA will remain in effect (meaning this test can be used) for the duration of the COVID-19 declaration under Section 564(b)(1) of the Act, 21 U.S.C. section 360bbb-3(b)(1), unless the authorization is terminated or revoked.  Performed at [facility], [address], [place], [place] [address]      Labs: BNP (last 3 results) Recent Labs    01/29/24 1356  BNP 80.7   Basic Metabolic Panel: Recent Labs  Lab 01/29/24 1356 01/29/24 1600 01/30/24 0446 01/30/24 1452 01/31/24 0338  NA 120* 121* 123* 124* 130*  K 3.8 3.4* 3.7 3.5 4.1  CL 83* 85* 86* 86* 92*  CO2 26 25 27 25 29   GLUCOSE 159* 178* 148* 117* 133*  BUN 16 16 14 18 16   CREATININE 0.87 0.80 0.74 0.86 0.63  CALCIUM  8.9 8.4* 8.3* 8.2* 8.2*  MG  --   --   --  2.3  --  PHOS  --   --   --  3.0  --    Liver Function Tests: Recent Labs  Lab 01/30/24 1452 01/31/24 0338  AST 33 29  ALT 27 25  ALKPHOS 65 64  BILITOT 0.4 0.8  PROT 6.3* 6.4*  ALBUMIN 3.5 3.6   No results for input(s): LIPASE, AMYLASE in the last 168 hours. No results for input(s): AMMONIA in the last 168 hours. CBC: Recent Labs  Lab 01/29/24 1356 01/30/24 0446 01/31/24 0338  WBC 17.1* 15.0* 12.5*  NEUTROABS  --  12.9*  --   HGB 12.2 11.4* 11.8*  HCT 35.0* 32.4* 36.2  MCV 81.6 81.2 87.2  PLT 291 302 297   Cardiac Enzymes: No results for input(s): CKTOTAL, CKMB, CKMBINDEX, TROPONINI in the last 168 hours. BNP: Invalid input(s): POCBNP CBG: Recent Labs  Lab 01/30/24 1619 01/30/24 2013 01/31/24 0731 01/31/24 1147  GLUCAP 171* 148* 112* 104*   D-Dimer No results for input(s): DDIMER in the last 72 hours. Hgb A1c No results for input(s): HGBA1C in the last 72 hours. Lipid Profile No results for input(s): CHOL, HDL, LDLCALC, TRIG, CHOLHDL, LDLDIRECT in the last 72 hours. Thyroid  function studies No results for input(s): TSH, T4TOTAL, T3FREE, THYROIDAB in the last 72 hours.  Invalid input(s): FREET3 Anemia work up No results for input(s): VITAMINB12, FOLATE, FERRITIN, TIBC, IRON, RETICCTPCT in the last 72 hours. Urinalysis No results found for: COLORURINE, APPEARANCEUR, LABSPEC, PHURINE,  GLUCOSEU, HGBUR, BILIRUBINUR, KETONESUR, PROTEINUR, UROBILINOGEN, NITRITE, LEUKOCYTESUR Sepsis Labs Recent Labs  Lab 01/29/24 1356 01/30/24 0446 01/31/24 0338  WBC 17.1* 15.0* 12.5*   Microbiology Recent Results (from the past 240 hours)  Resp panel by RT-PCR (RSV, Flu A&B, Covid) Anterior Nasal Swab     Status: None   Collection Time: 01/29/24  2:15 PM   Specimen: Anterior Nasal Swab  Result Value Ref Range Status   SARS Coronavirus 2 by RT PCR NEGATIVE NEGATIVE Final    Comment: (NOTE) SARS-CoV-2 target nucleic acids are NOT DETECTED.  The SARS-CoV-2 RNA is generally detectable in upper respiratory specimens during the acute phase of infection. The lowest concentration of SARS-CoV-2 viral copies this assay can detect is 138 copies/mL. A negative result does not preclude SARS-Cov-2 infection and should not be used as the sole basis for treatment or other patient management decisions. A negative result may occur with  improper specimen collection/handling, submission of specimen other than nasopharyngeal swab, presence of viral mutation(s) within the areas targeted by this assay, and inadequate number of viral copies(<138 copies/mL). A negative result must be combined with clinical observations, patient history, and epidemiological information. The expected result is Negative.  Fact Sheet for Patients:  bloggercourse.com  Fact Sheet for Healthcare Providers:  seriousbroker.it  This test is no t yet approved or cleared by the [place]  FDA and  has been authorized for detection and/or diagnosis of SARS-CoV-2 by FDA under an Emergency Use Authorization (EUA). This EUA will remain  in effect (meaning this test can be used) for the duration of the COVID-19 declaration under Section 564(b)(1) of the Act, 21 U.S.C.section 360bbb-3(b)(1), unless the authorization is terminated  or revoked sooner.        Influenza A by PCR NEGATIVE NEGATIVE Final   Influenza B by PCR NEGATIVE NEGATIVE Final    Comment: (NOTE) The Xpert Xpress SARS-CoV-2/FLU/RSV plus assay is intended as an aid in the diagnosis of influenza from Nasopharyngeal swab specimens and should not be used as a sole basis for treatment.  Nasal washings and aspirates are unacceptable for Xpert Xpress SARS-CoV-2/FLU/RSV testing.  Fact Sheet for Patients: bloggercourse.com  Fact Sheet for Healthcare Providers: seriousbroker.it  This test is not yet approved or cleared by the [place]  FDA and has been authorized for detection and/or diagnosis of SARS-CoV-2 by FDA under an Emergency Use Authorization (EUA). This EUA will remain in effect (meaning this test can be used) for the duration of the COVID-19 declaration under Section 564(b)(1) of the Act, 21 U.S.C. section 360bbb-3(b)(1), unless the authorization is terminated or revoked.     Resp Syncytial Virus by PCR NEGATIVE NEGATIVE Final    Comment: (NOTE) Fact Sheet for Patients: bloggercourse.com  Fact Sheet for Healthcare Providers: seriousbroker.it  This test is not yet approved or cleared by the [place]  FDA and has been authorized for detection and/or diagnosis of SARS-CoV-2 by FDA under an Emergency Use Authorization (EUA). This EUA will remain in effect (meaning this test can be used) for the duration of the COVID-19 declaration under Section 564(b)(1) of the Act, 21 U.S.C. section 360bbb-3(b)(1), unless the authorization is terminated or revoked.  Performed at [facility], [address] [place], [place] [address]      Time coordinating discharge:  I have spent 35 minutes face to face with the patient and on the ward discussing the patients care, assessment, plan and disposition with other care givers. >50% of the time was devoted  counseling the patient about the risks and benefits of treatment/Discharge disposition and coordinating care.   SIGNED:   Burgess JAYSON Dare, MD  [facility] 01/31/2024, 12:07 PM   If 7PM-7AM, please contact night-coverage

## 2024-01-31 NOTE — Plan of Care (Signed)
  Problem: Education: Goal: Knowledge of General Education information will improve Description: Including pain rating scale, medication(s)/side effects and non-pharmacologic comfort measures Outcome: Progressing   Problem: Health Behavior/Discharge P

## 2024-02-01 LAB — HEMOGLOBIN A1C
Hgb A1c MFr Bld: 6.5 % — ABNORMAL HIGH (ref 4.8–5.6)
Mean Plasma Glucose: 140 mg/dL

## 2024-02-02 LAB — LEGIONELLA PNEUMOPHILA SEROGP 1 UR AG: L. pneumophila Serogp 1 Ur Ag: NEGATIVE

## 2024-02-03 ENCOUNTER — Other Ambulatory Visit (HOSPITAL_COMMUNITY): Payer: Self-pay

## 2024-02-05 ENCOUNTER — Telehealth: Payer: Self-pay | Admitting: *Deleted

## 2024-02-05 NOTE — Telephone Encounter (Signed)
 Copied from CRM [id number]. Topic: Appointments - Scheduling Inquiry for Clinic >> Feb 05, 2024 11:21 AM Ilene Malling wrote: Reason for CRM: Patient [phone] was in [facility] 01/29/24 due to possible pneumonia and shortness of breath. Patient has an appointment with pcp and endocrinology next week, and is unsure if she needs to follow up with Dr. Bertrum Brodie? Please advise and call back.  Spoke with the pt  She states had recent hospitalization for reactive airway dz She was d/c on 01/31/24 and told to f/u with endocrinology She is asking if needs rov with MR  She is feeling well and states this is no rush at all  No openings until June 2025  Please advise when you would like see her, thanks!

## 2024-02-07 NOTE — Telephone Encounter (Signed)
 Was going to see her in FEb 2026 but given this admit - can see APP first avail is fine. Glad she is beter

## 2024-02-10 DIAGNOSIS — C73 Malignant neoplasm of thyroid gland: Secondary | ICD-10-CM | POA: Diagnosis not present

## 2024-02-10 DIAGNOSIS — E119 Type 2 diabetes mellitus without complications: Secondary | ICD-10-CM | POA: Diagnosis not present

## 2024-02-10 DIAGNOSIS — I2510 Atherosclerotic heart disease of native coronary artery without angina pectoris: Secondary | ICD-10-CM | POA: Diagnosis not present

## 2024-02-10 DIAGNOSIS — I10 Essential (primary) hypertension: Secondary | ICD-10-CM | POA: Diagnosis not present

## 2024-02-10 DIAGNOSIS — E871 Hypo-osmolality and hyponatremia: Secondary | ICD-10-CM | POA: Diagnosis not present

## 2024-02-10 DIAGNOSIS — J9601 Acute respiratory failure with hypoxia: Secondary | ICD-10-CM | POA: Diagnosis not present

## 2024-02-12 ENCOUNTER — Encounter: Payer: Self-pay | Admitting: Internal Medicine

## 2024-02-12 NOTE — Telephone Encounter (Signed)
 Spoke to patient and scheduled appt with Canary Ceo, NP 03/05/2024 at 10:00. Nothing further needed.

## 2024-02-24 ENCOUNTER — Other Ambulatory Visit: Payer: Self-pay

## 2024-02-24 MED ORDER — MOUNJARO 5 MG/0.5ML ~~LOC~~ SOAJ
5.0000 mg | SUBCUTANEOUS | 3 refills | Status: AC
  Filled 2024-02-24: qty 2, 28d supply, fill #0

## 2024-02-24 MED ORDER — LOSARTAN POTASSIUM 100 MG PO TABS
100.0000 mg | ORAL_TABLET | Freq: Every day | ORAL | 3 refills | Status: AC
  Filled 2024-02-24: qty 90, 90d supply, fill #0
  Filled 2024-05-25: qty 90, 90d supply, fill #1
  Filled 2024-08-17: qty 90, 90d supply, fill #2
  Filled 2024-10-23 – 2024-11-16 (×2): qty 90, 90d supply, fill #3

## 2024-02-24 MED ORDER — LEVOTHYROXINE SODIUM 125 MCG PO TABS
125.0000 ug | ORAL_TABLET | Freq: Every morning | ORAL | 0 refills | Status: DC
  Filled 2024-02-24: qty 30, 30d supply, fill #0
  Filled 2024-03-16 – 2024-03-27 (×2): qty 30, 30d supply, fill #1
  Filled 2024-04-24: qty 30, 30d supply, fill #2

## 2024-03-03 ENCOUNTER — Encounter: Payer: Self-pay | Admitting: Cardiology

## 2024-03-05 ENCOUNTER — Ambulatory Visit: Admitting: Nurse Practitioner

## 2024-03-16 ENCOUNTER — Other Ambulatory Visit: Payer: Self-pay

## 2024-03-17 ENCOUNTER — Other Ambulatory Visit: Payer: Self-pay

## 2024-03-17 DIAGNOSIS — F4322 Adjustment disorder with anxiety: Secondary | ICD-10-CM | POA: Diagnosis not present

## 2024-03-19 ENCOUNTER — Other Ambulatory Visit: Payer: Self-pay

## 2024-03-19 MED ORDER — MOUNJARO 7.5 MG/0.5ML ~~LOC~~ SOAJ
7.5000 mg | SUBCUTANEOUS | 3 refills | Status: AC
  Filled 2024-03-19 – 2024-03-20 (×2): qty 2, 28d supply, fill #0

## 2024-03-20 ENCOUNTER — Other Ambulatory Visit: Payer: Self-pay

## 2024-03-24 ENCOUNTER — Other Ambulatory Visit: Payer: Self-pay

## 2024-03-30 ENCOUNTER — Ambulatory Visit: Admitting: Internal Medicine

## 2024-03-30 ENCOUNTER — Other Ambulatory Visit (INDEPENDENT_AMBULATORY_CARE_PROVIDER_SITE_OTHER)

## 2024-03-30 ENCOUNTER — Other Ambulatory Visit (HOSPITAL_COMMUNITY): Payer: Self-pay

## 2024-03-30 ENCOUNTER — Encounter: Payer: Self-pay | Admitting: Internal Medicine

## 2024-03-30 VITALS — BP 146/86 | HR 80 | Ht 64.5 in | Wt 257.4 lb

## 2024-03-30 DIAGNOSIS — Z8709 Personal history of other diseases of the respiratory system: Secondary | ICD-10-CM | POA: Diagnosis not present

## 2024-03-30 DIAGNOSIS — Z09 Encounter for follow-up examination after completed treatment for conditions other than malignant neoplasm: Secondary | ICD-10-CM

## 2024-03-30 DIAGNOSIS — R053 Chronic cough: Secondary | ICD-10-CM | POA: Diagnosis not present

## 2024-03-30 DIAGNOSIS — R768 Other specified abnormal immunological findings in serum: Secondary | ICD-10-CM

## 2024-03-30 LAB — CBC WITH DIFFERENTIAL/PLATELET
Basophils Absolute: 0.1 10*3/uL (ref 0.0–0.1)
Basophils Relative: 0.8 % (ref 0.0–3.0)
Eosinophils Absolute: 0.2 10*3/uL (ref 0.0–0.7)
Eosinophils Relative: 2.6 % (ref 0.0–5.0)
HCT: 38.2 % (ref 36.0–46.0)
Hemoglobin: 12.7 g/dL (ref 12.0–15.0)
Lymphocytes Relative: 22.8 % (ref 12.0–46.0)
Lymphs Abs: 1.5 10*3/uL (ref 0.7–4.0)
MCHC: 33.2 g/dL (ref 30.0–36.0)
MCV: 84.5 fl (ref 78.0–100.0)
Monocytes Absolute: 0.4 10*3/uL (ref 0.1–1.0)
Monocytes Relative: 6 % (ref 3.0–12.0)
Neutro Abs: 4.6 10*3/uL (ref 1.4–7.7)
Neutrophils Relative %: 67.8 % (ref 43.0–77.0)
Platelets: 257 10*3/uL (ref 150.0–400.0)
RBC: 4.52 Mil/uL (ref 3.87–5.11)
RDW: 14.8 % (ref 11.5–15.5)
WBC: 6.7 10*3/uL (ref 4.0–10.5)

## 2024-03-30 NOTE — Progress Notes (Signed)
 OV 04/07/2021  Subjective:  Patient ID: Katelyn [name], female , DOB: Oct 21, 1955 , age 68 y.o. , MRN: [id number] , ADDRESS: [address] [place] [address] PCP Wyn Heater, MD Patient Care Team: Wyn Heater, MD as PCP - General (Family Medicine)  This Provider for this visit: Treatment Team:  Attending Provider: Maire Scot, MD    04/07/2021 -   Chief Complaint  Patient presents with   Consult    Patient reports cough x 1 year, non productive, some smells make it worse with the cough,      HPI Katelyn [name] 68 y.o. -presents for evaluation of chronic cough.  I took care of her of her husband 20th-3 years ago in the ICU and he passed away.  Shortly after that her mother also passed away.  She had prolonged grief reaction.  She just came off Lexapro .  She finds fulfillment with the help of her children and her grandchildren.  She has 4-5 grandchildren at this point.  She works as a Public affairs consultant at [employer].  She tells me that a few years ago she was on lisinopril and had chronic cough and the cough resolved when she stopped lisinopril.  After that she went on losartan .  She is currently on losartan .  For the last 1 year she has had insidious onset of chronic cough the cough is the same and feels similar to lisinopril cough.  She is wondering if losartan  can be causing the cough.  She says the cough is significant.  Symptom severity is below.  Stable since onset.  Cough is mostly in the daytime but sometimes wakes her up at night.  She has laryngeal quality to the cough.  Made worse by talking or laughing.  Made better by staying quiet and drinking cold water.  She not able to talk for a long time and zoom meetings.  This makes her cough and want to reach out to water.  Her voice gets hoarse.  Its a dry cough.  There is no wheezing or shortness of breath.    Cough related issue  - she has seen ENT and apparently vocal cords are red.  Unclear if she has  acid reflux.  - Of note few to several years ago she had thyroidectomy without any problems.  A few years ago she had intubation for the procedure and after that she has not been able to sing  -Unclear if she has acid reflux  -She reports no asthma but has seasonal allergies.  Previous blood eosinophils normal.  Today nitric oxide  9 ppb in and normal  -She does have hypertension on losartan .  Previously had cough with lisinopril.    Results for Blando, Katelyn E (MRN [id number]) as of 04/07/2021 09:52  Ref. Range 10/29/2019 10:05  Eosinophils Absolute Latest Ref Range: 0.0 - 0.5 K/uL 0.15 Oct 2021   Pt. Presents for follow up. She was seen 10/02/2021 for cough with wheezing and viral symptoms after sick exposures. CXR at the time showed early edema vs atypical infection. She was started on a prednisone  taper, Albuterol  nebs and Augmentin  in addition to  hydromet cough syrup. She presents today for evaluation of treatment. Pt. States she has been doing better since treatment. She states she completed the Augmentin  and prednisone  taper. She has been using the albuterol  nebs. She is better. She feels she has returned to baseline. She feels her voice is getting back to her baseline  strength. She will have follow up HRCT in 04/2022 with follow up after.   Test Results: CXR 10/02/2021>> Acute visit Interlobular septal thickening suggests early edema, though atypical infection could have this appearance  05/03/2021 HRCT Chest Mild patchy subpleural reticulation and ground-glass opacity in both lungs, asymmetrically prominent in the left lung. No traction bronchiectasis or frank honeycombing. Findings could be due to nonspecific postinfectious/postinflammatory scarring, with an interstitial lung disease such as nonspecific interstitial pneumonia (NSIP) or early usual interstitial pneumonia   04/2021 Labs : Previous visit for ? ILD vs auto immune work up. Serology (including rheumatoid factor,  ANA, scleroderma, TB, Sjogrens were negative) Eosinophils and IgE were somewhat elevated.   OV 09/25/2022  Subjective:  Patient ID: Katelyn [name], female , DOB: 10/24/1955 , age 89 y.o. , MRN: [id number] , ADDRESS: [address] [place] [address] PCP Masneri, Laurence Pons, DO Patient Care Team: Jinger Mount, DO as PCP - General (Family Medicine) Maire Scot, MD as Consulting Physician (Pulmonary Disease)  This Provider for this visit: Treatment Team:  Attending Provider: Maire Scot, MD    09/25/2022 -   Chief Complaint  Patient presents with   Follow-up    Pt states she has been doing good since last visit and states she is not coughing anymore.     HPI Katelyn [name] 68 y.o. -returns for follow-up.  I personally saw her 18 months ago.  After that she see nurse practitioners.  At this point in time the cough is nearly resolved.  She only has occasional cough when she gets exposed to dust or cold air at [employer].  She feels it is slightly abnormal compared to her peers but it still at a point where she would consider herself nearly asymptomatic.  She did have CT scan of the chest last summer and again this summer.  There is early interstitial abnormalities but not fitting the pattern of interstitial lung disease.  1 year scan is recommended for summer 2024.  Patient denying any shortness of breath.  In fact symptoms are better.  I discussed with them that she is okay postponing a follow-up CT scan from July 2024 to September 2024.       HRCT July 2023   IMPRESSION: 1. Minimal, irregular ground-glass and interstitial opacity in the bilateral lung bases, again more notable on the left. Findings are nonspecific and generally favored to reflect sequelae of prior infection or aspiration, however minimal, early fibrotic interstitial lung disease is not strictly excluded. Consider ongoing annual surveillance if there is high, persistent clinical  suspicion for fibrotic interstitial lung disease. Findings are indeterminate for UIP per consensus guidelines: Diagnosis of Idiopathic Pulmonary Fibrosis: An Official ATS/ERS/JRS/ALAT Clinical Practice Guideline. Am Annie Barton Crit Care Med Vol 198, Iss 5, [phone], Jun 15 2017. 2. Mild, diffuse bilateral bronchial wall thickening, consistent with nonspecific infectious or inflammatory bronchitis. 3. Enlargement of the main pulmonary artery, as can be seen in pulmonary hypertension. 4. Coronary artery disease.   Aortic Atherosclerosis (ICD10-I70.0).     Electronically Signed   By: Fredricka Jenny M.D.   On: 05/08/2022 14:03   OV 11/22/2023  Subjective:  Patient ID: Katelyn [name], female , DOB: January 29, 1956 , age 30 y.o. , MRN: [id number] , ADDRESS: [address] [place] [address] PCP Masneri, Laurence Pons, DO Patient Care Team: Jinger Mount, DO as PCP - General (Family Medicine) Maire Scot, MD as Consulting Physician (Pulmonary Disease)  This Provider for this  visit: Treatment Team:  Attending Provider: Maire Scot, MD    11/22/2023 -   Chief Complaint  Patient presents with   Follow-up    Pt states no new concerns at this time     HPI Kayde E Mancinas 68 y.o. -returns for annual follow-up.  Last CT scan for concern of ILD was in September 2024.  Personally visualized it and compared to the previous 1 to me the lower lobe groundglass opacities remain consistent with ILA but Dr. Kareen Osier has just called the imaging is air trapping.  Nevertheless we both agree the stable and no change.  She herself feels stable except the fact she has gained weight because of stress her son is going through divorce and being off Wegovy .  She also still complains of mild intermittent cough when she gets exposed to cologne dust or dust in the air duct or occasionally when she lies down it is mild and intermittent.  I did indicate to her with air trapping this is consistent with  airway problem and we took a shared decision making for now just with albuterol  would be fine.  We also decided that because she is a non-smoker we will just see her in 1 year with a pulmonary function test and set of imaging.  She is okay with this plan.      Narrative & Impression  CLINICAL DATA:  Pulmonary nodule, chronic cough, pulmonary hypertension.   EXAM: CT CHEST WITHOUT CONTRAST   TECHNIQUE: Multidetector CT imaging of the chest was performed following the standard protocol without intravenous contrast. High resolution imaging of the lungs, as well as inspiratory and expiratory imaging, was performed.   RADIATION DOSE REDUCTION: This exam was performed according to the departmental dose-optimization program which includes automated exposure control, adjustment of the mA and/or kV according to patient size and/or use of iterative reconstruction technique.   COMPARISON:  05/07/2022 and 05/03/2021.   FINDINGS: Cardiovascular: Atherosclerotic calcification of the aorta and left anterior descending coronary artery. Heart is enlarged. No pericardial effusion.   Mediastinum/Nodes: Thyroidectomy. No pathologically enlarged mediastinal or axillary lymph nodes. Hilar regions are difficult to definitively evaluate without IV contrast. Esophagus is grossly unremarkable.   Lungs/Pleura: Negative for subpleural reticulation, traction bronchiectasis/bronchiolectasis, ground glass, architectural distortion or honeycombing. Mild basilar pleuroparenchymal scarring. No pleural fluid. Airway is unremarkable. Minimal air trapping.   Upper Abdomen: Visualized portions of the liver, gallbladder, adrenal glands, kidneys, spleen, pancreas, stomach and bowel are grossly unremarkable. No upper abdominal adenopathy.   Musculoskeletal: Degenerative changes in the spine. No worrisome lytic or sclerotic lesions.   IMPRESSION: 1. No evidence of interstitial lung disease. Minimal air  trapping is indicative of small airways disease. 2. Aortic atherosclerosis (ICD10-I70.0). Left anterior descending coronary artery calcification.     Electronically Signed   By: Shearon Denis M.D.   On: 07/01/2023 11:13    OV 03/30/2024  Subjective:  Patient ID: Katelyn [name], female , DOB: 08/18/1956 , age 89 y.o. , MRN: [id number] , ADDRESS: [address] [place] [address] PCP Rosslyn Coons, MD Patient Care Team: Rosslyn Coons, MD as PCP - General (Endocrinology) Maire Scot, MD as Consulting Physician (Pulmonary Disease)  This Provider for this visit: Treatment Team:  Attending Provider: Maire Scot, MD    03/30/2024 -   Chief Complaint  Patient presents with   Follow-up    Cough has been well controlled. No new co's.      HPI Zilah Villaflor Logiudice 68 y.o. -  #  Follow-up of potential interstitial lung abnormalities versus air trapping # #Mild chronic cough made worse by perfumes and dust  Returns for follow-up.  When I saw her in February 2025 I thought I will see her back in February 2026 with repeat pulmonary function test.  However in the interim mid April 2025 she ended up getting hospitalized with wheezing.  She was admitted for 3 days.  Review of the records indicate the sounds like an asthma exacerbation.  According to her history she is dealing with sinus infection.  Also at her work at [employer] in the [place] she thought there is a lot of dust [subsequently adequately and a lot of dust has been discovered].  She also took grandchildren to do she was also increased to excess pollen.  After all that she got real bad wheezing that did not respond to outpatient antibiotic and IM Depo-Medrol  despite urgent care visit and she had to be hospitalized.  Discharge diagnose had acute hypoxemic respiratory failure and also reactive airways disease with wheezing.  She says she was also given a diagnosis of pulmonary hypertension  [this is incorrect] this is based on chart review of her CT scan and echocardiogram none of which show any evidence of pulmonary hypertension.  She was discharged with a short course of steroids.  Currently she is asymptomatic but not on any treatment.  Review of the records indicate that in April 2025 blood eosinophils was normal but 3 years ago blood IgE was elevated No RAST allergy panel testing done Pulmonary function test February 2025 normal      Dr Mitchel An Reflux Symptom Index (> 13-15 suggestive of LPR cough) 0 -> 5  =  none ->severe problem 03/30/2024   Hoarseness of problem with voice 3 0  Clearing  Of Throat 4   Excess throat mucus or feeling of post nasal drip 3 0  Difficulty swallowing food, liquid or tablets 0 0  Cough after eating or lying down 5 0  Breathing difficulties or choking episodes 0 0  Troublesome or annoying cough 5 0  Sensation of something sticking in throat or lump in throat 5 0  Heartburn, chest pain, indigestion, or stomach acid coming up 1 0  TOTAL 26 0    PFT     Latest Ref Rng & Units 11/21/2023    3:30 PM  PFT Results  FVC-Pre L 2.55   FVC-Predicted Pre % 84   FVC-Post L 2.56   FVC-Predicted Post % 85   Pre FEV1/FVC % % 82   Post FEV1/FCV % % 86   FEV1-Pre L 2.08   FEV1-Predicted Pre % 90   FEV1-Post L 2.21   DLCO uncorrected ml/min/mmHg 19.17   DLCO UNC% % 100   DLCO corrected ml/min/mmHg 19.17   DLCO COR %Predicted % 100   DLVA Predicted % 124   TLC L 4.66   TLC % Predicted % 94   RV % Predicted % 102        LAB RESULTS last 96 hours No results found.       has a past medical history of Cancer (HCC), Hypertension, Hypothyroidism, PONV (postoperative nausea and vomiting), Pre-diabetes, S/P laparoscopic assisted vaginal hysterectomy (LAVH) (02/26/2018), Seasonal allergies, and SVD (spontaneous vaginal delivery).   reports that she has never smoked. She has never used smokeless tobacco.  Past Surgical History:  Procedure  Laterality Date   ANTERIOR AND POSTERIOR REPAIR N/A 02/26/2018   Procedure: ANTERIOR (CYSTOCELE) AND  POSTERIOR REPAIR (RECTOCELE);  Surgeon: Margaretmary Shaver, MD;  Location: [facility];  Service: Gynecology;  Laterality: N/A;   COLONOSCOPY     LAPAROSCOPIC VAGINAL HYSTERECTOMY WITH SALPINGECTOMY Bilateral 02/26/2018   Procedure: LAPAROSCOPIC ASSISTED VAGINAL HYSTERECTOMY WITH SALPINGECTOMY;  Surgeon: Margaretmary Shaver, MD;  Location: [facility];  Service: Gynecology;  Laterality: Bilateral;   TONSILLECTOMY     TOTAL THYROIDECTOMY     in [place]   WISDOM TOOTH EXTRACTION      Allergies  Allergen Reactions   Lisinopril Cough   Sulfa Antibiotics Rash    Immunization History  Administered Date(s) Administered   Hepatitis B, ADULT 04/22/1985, 05/19/1985, 10/22/1985   Influenza Whole 07/16/2011, 07/23/2012, 08/03/2013, 07/06/2014   Influenza,inj,quad, With Preservative 07/25/2015   MMR 11/03/2003   PFIZER(Purple Top)SARS-COV-2 Vaccination 10/14/2019, 11/04/2019, 08/03/2020   Tdap 03/10/2008, 05/28/2016   Zoster, Live 10/22/2018, 03/18/2019    Family History  Problem Relation Age of Onset   Heart block Mother    Heart attack Father    Heart attack Sister      Current Outpatient Medications:    acetaminophen  (TYLENOL ) 500 MG tablet, Take 500 mg by mouth every 6 (six) hours as needed for mild pain (pain score 1-3) or moderate pain (pain score 4-6)., Disp: , Rfl:    albuterol  (PROVENTIL ) (2.5 MG/3ML) 0.083% nebulizer solution, Take 3 mLs (2.5 mg total) by nebulization every 6 (six) hours as needed for wheezing or shortness of breath., Disp: 75 mL, Rfl: 0   albuterol  (VENTOLIN  HFA) 108 (90 Base) MCG/ACT inhaler, Inhale 1 puff into the lungs every 4 (four) hours as needed., Disp: 6.7 g, Rfl: 0   aspirin  EC 81 MG tablet, Take 1 tablet (81 mg total) by mouth daily. Swallow whole., Disp: 90 tablet, Rfl: 3   atorvastatin  (LIPITOR) 10 MG tablet, Take 1 tablet (10 mg total) by mouth daily., Disp:  90 tablet, Rfl: 1   benzonatate  (TESSALON ) 100 MG capsule, Take 1 capsule (100 mg total) by mouth 3 (three) times daily as needed for cough., Disp: 20 capsule, Rfl: 0   Calcium  Carb-Cholecalciferol (CALCIUM +D3 PO), Take 1 tablet by mouth at bedtime., Disp: , Rfl:    Coenzyme Q10 (COQ10 PO), Take 1 tablet by mouth in the morning., Disp: , Rfl:    escitalopram  (LEXAPRO ) 10 MG tablet, Take 1 tablet (10 mg total) by mouth daily., Disp: 90 tablet, Rfl: 3   fluticasone  (FLONASE ) 50 MCG/ACT nasal spray, Place 1 spray into both nostrils daily., Disp: 16 g, Rfl: 1   furosemide  (LASIX ) 20 MG tablet, Take 1 tablet (20 mg total) by mouth daily as needed for edema or fluid., Disp: 30 tablet, Rfl: 0   ibuprofen  (ADVIL ) 200 MG tablet, Take 200-400 mg by mouth every 6 (six) hours as needed for mild pain (pain score 1-3) or moderate pain (pain score 4-6)., Disp: , Rfl:    levothyroxine  (SYNTHROID ) 125 MCG tablet, Take 1 tablet (125 mcg total) by mouth in the morning on an empty stomach., Disp: 90 tablet, Rfl: 0   losartan  (COZAAR ) 100 MG tablet, Take 1 tablet (100 mg total) by mouth daily., Disp: 90 tablet, Rfl: 3   metFORMIN  (GLUCOPHAGE -XR) 500 MG 24 hr tablet, Take 4 tablets (2,000 mg total) by mouth daily., Disp: 360 tablet, Rfl: 3   montelukast  (SINGULAIR ) 10 MG tablet, Take 1 tablet (10 mg total) by mouth at bedtime., Disp: 90 tablet, Rfl: 1   omeprazole  (PRILOSEC) 20 MG capsule, Take 1 capsule (20 mg total) by mouth daily., Disp:  90 capsule, Rfl: 1   tirzepatide  (MOUNJARO ) 7.5 MG/0.5ML Pen, Inject 7.5 mg into the skin once a week., Disp: 12 mL, Rfl: 3   levothyroxine  (SYNTHROID ) 112 MCG tablet, Take 1 tablet (112 mcg total) by mouth every morning on an empty stomach., Disp: 90 tablet, Rfl: 4   tirzepatide  (MOUNJARO ) 5 MG/0.5ML Pen, Inject 5 mg into the skin once a week., Disp: 6 mL, Rfl: 3      Objective:   Vitals:   03/30/24 1100  BP: (!) 146/86  Pulse: 80  SpO2: 96%  Weight: 257 lb 6.4 oz (116.8 kg)   Height: 5' 4.5 (1.638 m)    Estimated body mass index is 43.5 kg/m as calculated from the following:   Height as of this encounter: 5' 4.5 (1.638 m).   Weight as of this encounter: 257 lb 6.4 oz (116.8 kg).  @WEIGHTCHANGE @  [employer]   03/30/24 1100  Weight: 257 lb 6.4 oz (116.8 kg)     Physical Exam   General: No distress. Looks well O2 at rest: no Cane present: no Sitting in wheel chair: no Frail: no Obese: YES Neuro: Alert and Oriented x 3. GCS 15. Speech normal Psych: Pleasant Resp:  Barrel Chest - no.  Wheeze - no, Crackles - no, No overt respiratory distress CVS: Normal heart sounds. Murmurs - noi Ext: Stigmata of Connective Tissue Disease - no HEENT: Normal upper airway. PEERL +. No post nasal drip        Assessment:       ICD-10-CM   1. Chronic cough  R05.3 CBC w/Diff    Perennial allergen profile IgE    2. History of asthma  Z87.09 CBC w/Diff    Perennial allergen profile IgE    3. Hospital discharge follow-up  Z09 CBC w/Diff    Perennial allergen profile IgE    4. Elevated IgE level  R76.8 CBC w/Diff    Perennial allergen profile IgE     Assessment she has cough variant asthma.  And in the spring 2025 due to multiple factors that was allergy overload issue with exacerbation requiring admission and steroids.    Plan:     Patient Instructions     ICD-10-CM   1. Chronic cough  R05.3 CBC w/Diff    Perennial allergen profile IgE    2. History of asthma  Z87.09 CBC w/Diff    Perennial allergen profile IgE    3. Hospital discharge follow-up  Z09 CBC w/Diff    Perennial allergen profile IgE    4. Elevated IgE level  R76.8 CBC w/Diff    Perennial allergen profile IgE       Seems In April 2025 the mild chronic cough transformed into an acute asthma attack resulting in hospitalization. Triggers seem dust at work, polllen and zoo visit  Last CT scan of the chest July 2023 and Sept 2024 with subtle abnormalities of what is called ILA;  interstitial lung abnormalities - but no change.  Glad youa re well now   Plan - check RAST allergy panel  - start AIRSUPRA as needed  - take discount card  - based on results might hae to change strategy (chronic daily inhaled steroids or Singulair ]  - If expensive call us   - contiue extgernal envirometn control  -Remove pulmonary hypertension from problem list.  Follow-up - Return in 3 months   FOLLOWUP Return in about 3 months (around 06/30/2024) for 15 min visit, with Dr Bertrum Brodie, with any of the APPS.  SIGNATURE    Dr. Maire Scot, M.D., F.C.C.P,  Pulmonary and Critical Care Medicine Staff Physician, [facility] Center Director - Interstitial Lung Disease  Program  Pulmonary Fibrosis [facility] Network at [facility] [place], [place], [address]  Pager: [phone], If no answer or between  15:00h - 7:00h: call [phone] Telephone: [phone]  1:01 PM 03/30/2024

## 2024-03-30 NOTE — Patient Instructions (Addendum)
 ICD-10-CM   1. Chronic cough  R05.3 CBC w/Diff    Perennial allergen profile IgE    2. History of asthma  Z87.09 CBC w/Diff    Perennial allergen profile IgE    3. Hospital discharge follow-up  Z09 CBC w/Diff    Perennial allergen profile IgE    4. Elevated IgE level  R76.8 CBC w/Diff    Perennial allergen profile IgE       Seems In April 2025 the mild chronic cough transformed into an acute asthma attack resulting in hospitalization. Triggers seem dust at work, polllen and zoo visit  Last CT scan of the chest July 2023 and Sept 2024 with subtle abnormalities of what is called ILA; interstitial lung abnormalities - but no change.  Glad youa re well now   Plan - check RAST allergy panel  - start AIRSUPRA as needed  - take discount card  - based on results might hae to change strategy (chronic daily inhaled steroids or Singulair ]  - If expensive call us   - contiue extgernal envirometn control  -Remove pulmonary hypertension from problem list.  Follow-up - Return in 3 months

## 2024-03-31 ENCOUNTER — Other Ambulatory Visit: Payer: Self-pay

## 2024-03-31 ENCOUNTER — Other Ambulatory Visit (HOSPITAL_COMMUNITY): Payer: Self-pay

## 2024-03-31 ENCOUNTER — Encounter: Payer: Self-pay | Admitting: Internal Medicine

## 2024-03-31 DIAGNOSIS — F4322 Adjustment disorder with anxiety: Secondary | ICD-10-CM | POA: Diagnosis not present

## 2024-03-31 MED ORDER — AIRSUPRA 90-80 MCG/ACT IN AERO
2.0000 | INHALATION_SPRAY | Freq: Four times a day (QID) | RESPIRATORY_TRACT | 11 refills | Status: AC | PRN
  Filled 2024-03-31: qty 10.7, 25d supply, fill #0
  Filled 2024-03-31: qty 10.7, fill #0
  Filled 2024-04-24: qty 10.7, 25d supply, fill #1
  Filled 2024-06-08: qty 10.7, 25d supply, fill #2
  Filled 2024-08-17: qty 10.7, 25d supply, fill #3
  Filled 2024-09-17 – 2024-09-30 (×2): qty 10.7, 25d supply, fill #4
  Filled 2024-10-23: qty 10.7, 25d supply, fill #5

## 2024-04-02 LAB — ALLERGEN PROFILE, PERENNIAL ALLERGEN IGE
Alternaria Alternata IgE: <0.1 kU/L
Aspergillus Fumigatus IgE: <0.1 kU/L
Aureobasidi Pullulans IgE: <0.1 kU/L
Candida Albicans IgE: 1.34 kU/L — AB
Cat Dander IgE: <0.1 kU/L
Chicken Feathers IgE: <0.1 kU/L
Cladosporium Herbarum IgE: <0.1 kU/L
Cow Dander IgE: <0.1 kU/L
D Farinae IgE: 5.4 kU/L — AB
D Pteronyssinus IgE: 5.21 kU/L — AB
Dog Dander IgE: <0.1 kU/L
Duck Feathers IgE: <0.1 kU/L
Goose Feathers IgE: <0.1 kU/L
Mouse Urine IgE: <0.1 kU/L
Mucor Racemosus IgE: <0.1 kU/L
Penicillium Chrysogen IgE: <0.1 kU/L
Phoma Betae IgE: <0.1 kU/L
Setomelanomma Rostrat: <0.1 kU/L
Stemphylium Herbarum IgE: <0.1 kU/L

## 2024-04-14 ENCOUNTER — Other Ambulatory Visit: Payer: Self-pay

## 2024-04-14 DIAGNOSIS — F4322 Adjustment disorder with anxiety: Secondary | ICD-10-CM | POA: Diagnosis not present

## 2024-04-14 MED ORDER — MOUNJARO 10 MG/0.5ML ~~LOC~~ SOAJ
10.0000 mg | SUBCUTANEOUS | 3 refills | Status: AC
  Filled 2024-04-14: qty 2, 28d supply, fill #0

## 2024-04-14 MED ORDER — MOUNJARO 10 MG/0.5ML ~~LOC~~ SOAJ: SUBCUTANEOUS | 0 refills | Status: DC

## 2024-04-15 ENCOUNTER — Other Ambulatory Visit: Payer: Self-pay

## 2024-04-19 ENCOUNTER — Telehealth: Payer: Self-pay | Admitting: Internal Medicine

## 2024-04-19 NOTE — Telephone Encounter (Signed)
   STrong dust mite allergy +   Plan - reduce dust mite allergy exposure at home/bed - she needs to google this  Xxxxxx     Google Dust Mite Allergy and get info from [facility] [employer] and pillows in zippered dust-proof covers. These covers are made of a material with pores too small to let dust mites and their waste product through. They are also called allergen-impermeable. Plastic or vinyl covers are the least expensive, but some people find them uncomfortable. You can buy other fabric allergen-impermeable covers from many regular bedding stores.   Wash your sheets and blankets weekly in hot water. You have to wash them in water that's at least 130 F or more to kill dust mites.  Get rid of all types of fabric that mites love and that you cannot easily wash regularly in hot water.   Avoid wall-to-wall carpeting, curtains, blinds, upholstered furniture and down-filled covers and pillows in the bedroom. Put roll-type shades on your windows instead of curtains.  Have someone without a dust mite allergy clean your bedroom. If this is not possible, wear a filtering mask when dusting or vacuuming. Many drug stores carry these items. Dusting and vacuuming stir up dust. So try to do these chores when you can stay out of the bedroom for a while afterward.   Special CERTIFIED filter vacuum cleaners can help to keep mites and mite waste from getting back into the air. CERTIFIED vacuums are scientifically tested and verified as more suitable for making your home healthier.  Vacuuming is not enough to remove all dust mites and their waste. A large amount of the dust mite population may remain because they live deep inside the stuffing of sofas, chairs, mattresses, pillows, and carpeting.  Treat other rooms in your house like your bedroom. Here are more tips:  Avoid wall-to-wall carpeting, if possible. If you do use carpeting, mites don't like the type  with a short, tight pile as much. Use washable throw rugs over regularly damp-mopped wood, linoleum or tiled floors. Wash rugs in hot water whenever possible. Cold water isn't as effective. Dry cleaning kills all dust mites and is also good for removing dust mites from living in fabrics. Keep the humidity in your home less than 50%. Use a dehumidifier and/or air conditioner to do this. Use a CERTIFIED filter with your central furnace and air conditioning unit. This can help trap dust mites from your entire home. Freestanding air cleaners only filter air in a limited area. Avoid devices that treat air with heat, electrostatic ions, or ozon

## 2024-04-21 NOTE — Telephone Encounter (Signed)
 Called the pt and there was no answer- LMTCB

## 2024-04-23 NOTE — Telephone Encounter (Signed)
 Sent the pt msg with results via mychart

## 2024-04-24 ENCOUNTER — Other Ambulatory Visit: Payer: Self-pay

## 2024-04-24 ENCOUNTER — Other Ambulatory Visit (HOSPITAL_COMMUNITY): Payer: Self-pay

## 2024-04-28 DIAGNOSIS — F4322 Adjustment disorder with anxiety: Secondary | ICD-10-CM | POA: Diagnosis not present

## 2024-04-29 ENCOUNTER — Other Ambulatory Visit (HOSPITAL_COMMUNITY): Payer: Self-pay

## 2024-05-11 ENCOUNTER — Other Ambulatory Visit: Payer: Self-pay

## 2024-05-11 MED ORDER — MOUNJARO 12.5 MG/0.5ML ~~LOC~~ SOAJ
12.5000 mg | SUBCUTANEOUS | 3 refills | Status: AC
  Filled 2024-05-11: qty 2, 28d supply, fill #0

## 2024-05-12 ENCOUNTER — Other Ambulatory Visit (HOSPITAL_COMMUNITY): Payer: Self-pay

## 2024-05-12 ENCOUNTER — Other Ambulatory Visit: Payer: Self-pay

## 2024-05-18 DIAGNOSIS — F4322 Adjustment disorder with anxiety: Secondary | ICD-10-CM | POA: Diagnosis not present

## 2024-05-19 ENCOUNTER — Other Ambulatory Visit: Payer: Self-pay

## 2024-05-19 MED ORDER — LEVOTHYROXINE SODIUM 125 MCG PO TABS
125.0000 ug | ORAL_TABLET | Freq: Every morning | ORAL | 0 refills | Status: DC
  Filled 2024-05-19: qty 90, 90d supply, fill #0

## 2024-05-20 ENCOUNTER — Other Ambulatory Visit: Payer: Self-pay

## 2024-05-25 ENCOUNTER — Other Ambulatory Visit: Payer: Self-pay

## 2024-05-25 DIAGNOSIS — S161XXA Strain of muscle, fascia and tendon at neck level, initial encounter: Secondary | ICD-10-CM | POA: Diagnosis not present

## 2024-05-25 DIAGNOSIS — S338XXA Sprain of other parts of lumbar spine and pelvis, initial encounter: Secondary | ICD-10-CM | POA: Diagnosis not present

## 2024-05-25 DIAGNOSIS — S233XXA Sprain of ligaments of thoracic spine, initial encounter: Secondary | ICD-10-CM | POA: Diagnosis not present

## 2024-05-25 DIAGNOSIS — S73101A Unspecified sprain of right hip, initial encounter: Secondary | ICD-10-CM | POA: Diagnosis not present

## 2024-05-27 ENCOUNTER — Other Ambulatory Visit: Payer: Self-pay

## 2024-06-05 ENCOUNTER — Other Ambulatory Visit: Payer: Self-pay

## 2024-06-05 ENCOUNTER — Other Ambulatory Visit (HOSPITAL_COMMUNITY): Payer: Self-pay

## 2024-06-05 MED ORDER — MOUNJARO 12.5 MG/0.5ML ~~LOC~~ SOAJ
12.5000 mg | SUBCUTANEOUS | 3 refills | Status: AC
  Filled 2024-06-05 (×2): qty 6, 84d supply, fill #0
  Filled 2024-08-26: qty 6, 84d supply, fill #1
  Filled 2024-10-23 – 2024-11-16 (×2): qty 6, 84d supply, fill #2

## 2024-06-05 MED ORDER — ATORVASTATIN CALCIUM 10 MG PO TABS
10.0000 mg | ORAL_TABLET | Freq: Every day | ORAL | 0 refills | Status: DC
  Filled 2024-06-05: qty 90, 90d supply, fill #0

## 2024-06-08 ENCOUNTER — Other Ambulatory Visit: Payer: Self-pay | Admitting: Primary Care

## 2024-06-08 ENCOUNTER — Other Ambulatory Visit: Payer: Self-pay

## 2024-06-08 ENCOUNTER — Other Ambulatory Visit: Payer: Self-pay | Admitting: Internal Medicine

## 2024-06-08 ENCOUNTER — Other Ambulatory Visit (HOSPITAL_COMMUNITY): Payer: Self-pay

## 2024-06-08 DIAGNOSIS — S73101A Unspecified sprain of right hip, initial encounter: Secondary | ICD-10-CM | POA: Diagnosis not present

## 2024-06-08 DIAGNOSIS — S161XXA Strain of muscle, fascia and tendon at neck level, initial encounter: Secondary | ICD-10-CM | POA: Diagnosis not present

## 2024-06-08 DIAGNOSIS — S233XXA Sprain of ligaments of thoracic spine, initial encounter: Secondary | ICD-10-CM | POA: Diagnosis not present

## 2024-06-08 DIAGNOSIS — S338XXA Sprain of other parts of lumbar spine and pelvis, initial encounter: Secondary | ICD-10-CM | POA: Diagnosis not present

## 2024-06-08 MED ORDER — OMEPRAZOLE 20 MG PO CPDR
20.0000 mg | DELAYED_RELEASE_CAPSULE | Freq: Every day | ORAL | 1 refills | Status: AC
  Filled 2024-06-08: qty 90, 90d supply, fill #0
  Filled 2024-09-17 – 2024-09-30 (×2): qty 90, 90d supply, fill #1

## 2024-06-08 MED ORDER — MONTELUKAST SODIUM 10 MG PO TABS
10.0000 mg | ORAL_TABLET | Freq: Every day | ORAL | 3 refills | Status: AC
  Filled 2024-06-08: qty 90, 90d supply, fill #0
  Filled 2024-09-17 – 2024-09-30 (×2): qty 90, 90d supply, fill #1
  Filled 2024-10-23: qty 90, 90d supply, fill #2

## 2024-06-09 ENCOUNTER — Other Ambulatory Visit: Payer: Self-pay

## 2024-06-17 ENCOUNTER — Other Ambulatory Visit: Payer: Self-pay

## 2024-06-18 DIAGNOSIS — S73101A Unspecified sprain of right hip, initial encounter: Secondary | ICD-10-CM | POA: Diagnosis not present

## 2024-06-18 DIAGNOSIS — S233XXA Sprain of ligaments of thoracic spine, initial encounter: Secondary | ICD-10-CM | POA: Diagnosis not present

## 2024-06-18 DIAGNOSIS — S161XXA Strain of muscle, fascia and tendon at neck level, initial encounter: Secondary | ICD-10-CM | POA: Diagnosis not present

## 2024-06-18 DIAGNOSIS — S338XXA Sprain of other parts of lumbar spine and pelvis, initial encounter: Secondary | ICD-10-CM | POA: Diagnosis not present

## 2024-06-25 DIAGNOSIS — S233XXA Sprain of ligaments of thoracic spine, initial encounter: Secondary | ICD-10-CM | POA: Diagnosis not present

## 2024-06-25 DIAGNOSIS — S161XXA Strain of muscle, fascia and tendon at neck level, initial encounter: Secondary | ICD-10-CM | POA: Diagnosis not present

## 2024-06-25 DIAGNOSIS — S338XXA Sprain of other parts of lumbar spine and pelvis, initial encounter: Secondary | ICD-10-CM | POA: Diagnosis not present

## 2024-06-25 DIAGNOSIS — S73101A Unspecified sprain of right hip, initial encounter: Secondary | ICD-10-CM | POA: Diagnosis not present

## 2024-06-26 ENCOUNTER — Other Ambulatory Visit: Payer: Self-pay

## 2024-06-26 DIAGNOSIS — C73 Malignant neoplasm of thyroid gland: Secondary | ICD-10-CM | POA: Diagnosis not present

## 2024-06-26 DIAGNOSIS — I2510 Atherosclerotic heart disease of native coronary artery without angina pectoris: Secondary | ICD-10-CM | POA: Diagnosis not present

## 2024-06-26 DIAGNOSIS — F32A Depression, unspecified: Secondary | ICD-10-CM | POA: Diagnosis not present

## 2024-06-26 DIAGNOSIS — E871 Hypo-osmolality and hyponatremia: Secondary | ICD-10-CM | POA: Diagnosis not present

## 2024-06-26 DIAGNOSIS — I10 Essential (primary) hypertension: Secondary | ICD-10-CM | POA: Diagnosis not present

## 2024-06-26 DIAGNOSIS — E890 Postprocedural hypothyroidism: Secondary | ICD-10-CM | POA: Diagnosis not present

## 2024-06-26 DIAGNOSIS — H34231 Retinal artery branch occlusion, right eye: Secondary | ICD-10-CM | POA: Diagnosis not present

## 2024-06-26 DIAGNOSIS — E119 Type 2 diabetes mellitus without complications: Secondary | ICD-10-CM | POA: Diagnosis not present

## 2024-06-26 DIAGNOSIS — E782 Mixed hyperlipidemia: Secondary | ICD-10-CM | POA: Diagnosis not present

## 2024-06-26 MED ORDER — WEGOVY 0.5 MG/0.5ML ~~LOC~~ SOAJ: 0.5000 mg | SUBCUTANEOUS | 3 refills | Status: AC

## 2024-06-26 MED ORDER — WEGOVY 0.25 MG/0.5ML ~~LOC~~ SOAJ
0.2500 mg | SUBCUTANEOUS | 0 refills | Status: AC
  Filled 2024-06-26 – 2024-07-27 (×2): qty 2, 28d supply, fill #0

## 2024-06-28 ENCOUNTER — Other Ambulatory Visit: Payer: Self-pay

## 2024-07-01 ENCOUNTER — Other Ambulatory Visit: Payer: Self-pay

## 2024-07-03 DIAGNOSIS — S161XXA Strain of muscle, fascia and tendon at neck level, initial encounter: Secondary | ICD-10-CM | POA: Diagnosis not present

## 2024-07-03 DIAGNOSIS — S233XXA Sprain of ligaments of thoracic spine, initial encounter: Secondary | ICD-10-CM | POA: Diagnosis not present

## 2024-07-03 DIAGNOSIS — S338XXA Sprain of other parts of lumbar spine and pelvis, initial encounter: Secondary | ICD-10-CM | POA: Diagnosis not present

## 2024-07-03 DIAGNOSIS — S73101A Unspecified sprain of right hip, initial encounter: Secondary | ICD-10-CM | POA: Diagnosis not present

## 2024-07-06 ENCOUNTER — Other Ambulatory Visit: Payer: Self-pay

## 2024-07-07 ENCOUNTER — Other Ambulatory Visit: Payer: Self-pay

## 2024-07-09 DIAGNOSIS — S161XXA Strain of muscle, fascia and tendon at neck level, initial encounter: Secondary | ICD-10-CM | POA: Diagnosis not present

## 2024-07-09 DIAGNOSIS — S338XXA Sprain of other parts of lumbar spine and pelvis, initial encounter: Secondary | ICD-10-CM | POA: Diagnosis not present

## 2024-07-09 DIAGNOSIS — S233XXA Sprain of ligaments of thoracic spine, initial encounter: Secondary | ICD-10-CM | POA: Diagnosis not present

## 2024-07-09 DIAGNOSIS — S73101A Unspecified sprain of right hip, initial encounter: Secondary | ICD-10-CM | POA: Diagnosis not present

## 2024-07-13 ENCOUNTER — Other Ambulatory Visit: Payer: Self-pay

## 2024-07-14 ENCOUNTER — Other Ambulatory Visit: Payer: Self-pay

## 2024-07-27 ENCOUNTER — Other Ambulatory Visit: Payer: Self-pay

## 2024-08-04 DIAGNOSIS — H2512 Age-related nuclear cataract, left eye: Secondary | ICD-10-CM | POA: Diagnosis not present

## 2024-08-04 DIAGNOSIS — H26491 Other secondary cataract, right eye: Secondary | ICD-10-CM | POA: Diagnosis not present

## 2024-08-04 DIAGNOSIS — H348321 Tributary (branch) retinal vein occlusion, left eye, with retinal neovascularization: Secondary | ICD-10-CM | POA: Diagnosis not present

## 2024-08-17 ENCOUNTER — Other Ambulatory Visit: Payer: Self-pay

## 2024-08-17 MED ORDER — LEVOTHYROXINE SODIUM 125 MCG PO TABS
125.0000 ug | ORAL_TABLET | Freq: Every morning | ORAL | 4 refills | Status: AC
  Filled 2024-08-17: qty 90, 90d supply, fill #0
  Filled 2024-10-23 – 2024-11-16 (×2): qty 90, 90d supply, fill #1

## 2024-08-18 ENCOUNTER — Other Ambulatory Visit: Payer: Self-pay

## 2024-08-24 DIAGNOSIS — F4322 Adjustment disorder with anxiety: Secondary | ICD-10-CM | POA: Diagnosis not present

## 2024-08-26 ENCOUNTER — Other Ambulatory Visit: Payer: Self-pay

## 2024-08-27 ENCOUNTER — Other Ambulatory Visit: Payer: Self-pay

## 2024-08-27 MED ORDER — ATORVASTATIN CALCIUM 10 MG PO TABS
10.0000 mg | ORAL_TABLET | Freq: Every day | ORAL | 3 refills | Status: AC
  Filled 2024-08-31: qty 90, 90d supply, fill #0
  Filled 2024-10-23 – 2024-11-16 (×2): qty 90, 90d supply, fill #1

## 2024-08-27 MED ORDER — ESCITALOPRAM OXALATE 10 MG PO TABS
10.0000 mg | ORAL_TABLET | Freq: Every day | ORAL | 3 refills | Status: AC
  Filled 2024-08-31 (×2): qty 90, 90d supply, fill #0
  Filled 2024-10-23 – 2024-11-16 (×2): qty 90, 90d supply, fill #1

## 2024-08-31 ENCOUNTER — Other Ambulatory Visit: Payer: Self-pay

## 2024-09-06 NOTE — Telephone Encounter (Signed)
 Entered in error

## 2024-09-21 ENCOUNTER — Other Ambulatory Visit: Payer: Self-pay

## 2024-09-21 DIAGNOSIS — F4322 Adjustment disorder with anxiety: Secondary | ICD-10-CM | POA: Diagnosis not present

## 2024-09-24 ENCOUNTER — Other Ambulatory Visit: Payer: Self-pay

## 2024-09-28 ENCOUNTER — Other Ambulatory Visit: Payer: Self-pay

## 2024-09-28 MED ORDER — AMOXICILLIN-POT CLAVULANATE 875-125 MG PO TABS
1.0000 | ORAL_TABLET | Freq: Two times a day (BID) | ORAL | 0 refills | Status: AC
  Filled 2024-09-28: qty 14, 7d supply, fill #0

## 2024-09-30 ENCOUNTER — Other Ambulatory Visit (HOSPITAL_COMMUNITY): Payer: Self-pay

## 2024-10-23 ENCOUNTER — Other Ambulatory Visit (HOSPITAL_COMMUNITY): Payer: Self-pay

## 2024-10-23 ENCOUNTER — Other Ambulatory Visit: Payer: Self-pay | Admitting: Primary Care

## 2024-10-23 ENCOUNTER — Other Ambulatory Visit: Payer: Self-pay

## 2024-10-23 NOTE — Telephone Encounter (Signed)
 Pt requesting refill of omeprazole , not mentioned in LOV to continue or d/c. Please advise, thank you!
